# Patient Record
Sex: Female | Born: 1940
Health system: Southern US, Community
[De-identification: ages and names within clinical notes are randomized; demographics above are authoritative.]

## PROBLEM LIST (undated history)

## (undated) DIAGNOSIS — J45909 Unspecified asthma, uncomplicated: Secondary | ICD-10-CM

## (undated) DIAGNOSIS — I429 Cardiomyopathy, unspecified: Secondary | ICD-10-CM

## (undated) DIAGNOSIS — J449 Chronic obstructive pulmonary disease, unspecified: Secondary | ICD-10-CM

## (undated) DIAGNOSIS — I519 Heart disease, unspecified: Secondary | ICD-10-CM

## (undated) DIAGNOSIS — I10 Essential (primary) hypertension: Secondary | ICD-10-CM

## (undated) DIAGNOSIS — M1990 Unspecified osteoarthritis, unspecified site: Secondary | ICD-10-CM

## (undated) DIAGNOSIS — I5189 Other ill-defined heart diseases: Secondary | ICD-10-CM

## (undated) DIAGNOSIS — E7800 Pure hypercholesterolemia, unspecified: Secondary | ICD-10-CM

## (undated) DIAGNOSIS — H409 Unspecified glaucoma: Secondary | ICD-10-CM

## (undated) HISTORY — PX: HIP SURGERY: SHX245

## (undated) HISTORY — PX: REPLACEMENT TOTAL KNEE BILATERAL: SUR1225

## (undated) HISTORY — PX: ABDOMINAL HYSTERECTOMY: SHX81

## (undated) HISTORY — PX: JOINT REPLACEMENT: SHX530

## (undated) HISTORY — PX: OTHER SURGICAL HISTORY: SHX169

---

## 1997-12-29 ENCOUNTER — Ambulatory Visit (HOSPITAL_COMMUNITY): Admission: RE | Admit: 1997-12-29 | Discharge: 1997-12-29 | Payer: Self-pay | Admitting: Family Medicine

## 1999-01-30 ENCOUNTER — Encounter: Payer: Self-pay | Admitting: Family Medicine

## 1999-01-30 ENCOUNTER — Ambulatory Visit (HOSPITAL_COMMUNITY): Admission: RE | Admit: 1999-01-30 | Discharge: 1999-01-30 | Payer: Self-pay | Admitting: Family Medicine

## 1999-02-07 ENCOUNTER — Ambulatory Visit (HOSPITAL_COMMUNITY): Admission: RE | Admit: 1999-02-07 | Discharge: 1999-02-07 | Payer: Self-pay | Admitting: Family Medicine

## 1999-03-03 ENCOUNTER — Encounter: Payer: Self-pay | Admitting: Family Medicine

## 1999-03-03 ENCOUNTER — Ambulatory Visit (HOSPITAL_COMMUNITY): Admission: RE | Admit: 1999-03-03 | Discharge: 1999-03-03 | Payer: Self-pay | Admitting: Family Medicine

## 1999-03-21 ENCOUNTER — Ambulatory Visit (HOSPITAL_BASED_OUTPATIENT_CLINIC_OR_DEPARTMENT_OTHER): Admission: RE | Admit: 1999-03-21 | Discharge: 1999-03-21 | Payer: Self-pay

## 1999-05-15 ENCOUNTER — Encounter: Payer: Self-pay | Admitting: Orthopedic Surgery

## 1999-05-22 ENCOUNTER — Inpatient Hospital Stay (HOSPITAL_COMMUNITY): Admission: RE | Admit: 1999-05-22 | Discharge: 1999-05-27 | Payer: Self-pay | Admitting: Orthopedic Surgery

## 1999-05-22 ENCOUNTER — Encounter: Payer: Self-pay | Admitting: Orthopedic Surgery

## 2000-04-12 ENCOUNTER — Other Ambulatory Visit: Admission: RE | Admit: 2000-04-12 | Discharge: 2000-04-12 | Payer: Self-pay | Admitting: Family Medicine

## 2000-06-28 ENCOUNTER — Encounter: Admission: RE | Admit: 2000-06-28 | Discharge: 2000-06-28 | Payer: Self-pay | Admitting: Orthopedic Surgery

## 2000-06-28 ENCOUNTER — Encounter: Payer: Self-pay | Admitting: Orthopedic Surgery

## 2002-12-23 ENCOUNTER — Encounter: Payer: Self-pay | Admitting: Internal Medicine

## 2002-12-23 ENCOUNTER — Ambulatory Visit (HOSPITAL_COMMUNITY): Admission: RE | Admit: 2002-12-23 | Discharge: 2002-12-23 | Payer: Self-pay | Admitting: Family Medicine

## 2004-02-06 ENCOUNTER — Emergency Department (HOSPITAL_COMMUNITY): Admission: AD | Admit: 2004-02-06 | Discharge: 2004-02-06 | Payer: Self-pay | Admitting: Family Medicine

## 2004-05-19 ENCOUNTER — Other Ambulatory Visit: Admission: RE | Admit: 2004-05-19 | Discharge: 2004-05-19 | Payer: Self-pay | Admitting: Family Medicine

## 2004-05-24 ENCOUNTER — Encounter: Admission: RE | Admit: 2004-05-24 | Discharge: 2004-05-24 | Payer: Self-pay | Admitting: Family Medicine

## 2004-08-14 ENCOUNTER — Ambulatory Visit (HOSPITAL_COMMUNITY): Admission: RE | Admit: 2004-08-14 | Discharge: 2004-08-14 | Payer: Self-pay | Admitting: Gastroenterology

## 2005-11-19 ENCOUNTER — Other Ambulatory Visit: Admission: RE | Admit: 2005-11-19 | Discharge: 2005-11-19 | Payer: Self-pay | Admitting: Family Medicine

## 2005-11-19 ENCOUNTER — Encounter: Admission: RE | Admit: 2005-11-19 | Discharge: 2005-11-19 | Payer: Self-pay | Admitting: Family Medicine

## 2005-12-04 ENCOUNTER — Ambulatory Visit (HOSPITAL_BASED_OUTPATIENT_CLINIC_OR_DEPARTMENT_OTHER): Admission: RE | Admit: 2005-12-04 | Discharge: 2005-12-04 | Payer: Self-pay | Admitting: Orthopedic Surgery

## 2006-03-06 ENCOUNTER — Encounter: Payer: Self-pay | Admitting: Family Medicine

## 2006-11-20 ENCOUNTER — Encounter: Admission: RE | Admit: 2006-11-20 | Discharge: 2006-11-20 | Payer: Self-pay | Admitting: Family Medicine

## 2006-11-27 ENCOUNTER — Encounter: Admission: RE | Admit: 2006-11-27 | Discharge: 2006-11-27 | Payer: Self-pay | Admitting: Family Medicine

## 2008-03-01 ENCOUNTER — Ambulatory Visit: Payer: Self-pay | Admitting: Internal Medicine

## 2008-03-01 DIAGNOSIS — E785 Hyperlipidemia, unspecified: Secondary | ICD-10-CM | POA: Insufficient documentation

## 2008-03-01 DIAGNOSIS — I10 Essential (primary) hypertension: Secondary | ICD-10-CM

## 2008-03-01 DIAGNOSIS — R0602 Shortness of breath: Secondary | ICD-10-CM | POA: Insufficient documentation

## 2008-03-01 DIAGNOSIS — J309 Allergic rhinitis, unspecified: Secondary | ICD-10-CM | POA: Insufficient documentation

## 2008-03-04 ENCOUNTER — Ambulatory Visit (HOSPITAL_COMMUNITY): Admission: RE | Admit: 2008-03-04 | Discharge: 2008-03-04 | Payer: Self-pay | Admitting: Internal Medicine

## 2008-03-04 ENCOUNTER — Encounter: Payer: Self-pay | Admitting: Internal Medicine

## 2008-03-09 ENCOUNTER — Telehealth (INDEPENDENT_AMBULATORY_CARE_PROVIDER_SITE_OTHER): Payer: Self-pay | Admitting: *Deleted

## 2008-03-12 ENCOUNTER — Telehealth: Payer: Self-pay | Admitting: Internal Medicine

## 2008-03-22 ENCOUNTER — Telehealth: Payer: Self-pay | Admitting: Internal Medicine

## 2008-03-25 ENCOUNTER — Ambulatory Visit: Payer: Self-pay | Admitting: Internal Medicine

## 2008-03-25 ENCOUNTER — Telehealth: Payer: Self-pay | Admitting: Internal Medicine

## 2008-03-25 LAB — CONVERTED CEMR LAB
BUN: 11 mg/dL (ref 6–23)
CO2: 29 meq/L (ref 19–32)
Calcium: 10.6 mg/dL — ABNORMAL HIGH (ref 8.4–10.5)
GFR calc Af Amer: 128 mL/min
Glucose, Bld: 162 mg/dL — ABNORMAL HIGH (ref 70–99)

## 2008-04-01 ENCOUNTER — Ambulatory Visit: Payer: Self-pay

## 2008-04-01 ENCOUNTER — Encounter: Payer: Self-pay | Admitting: Internal Medicine

## 2008-04-05 ENCOUNTER — Telehealth (INDEPENDENT_AMBULATORY_CARE_PROVIDER_SITE_OTHER): Payer: Self-pay | Admitting: *Deleted

## 2008-04-28 ENCOUNTER — Ambulatory Visit: Payer: Self-pay | Admitting: Internal Medicine

## 2008-06-21 ENCOUNTER — Inpatient Hospital Stay (HOSPITAL_COMMUNITY): Admission: RE | Admit: 2008-06-21 | Discharge: 2008-06-25 | Payer: Self-pay | Admitting: Orthopedic Surgery

## 2008-11-23 ENCOUNTER — Other Ambulatory Visit: Admission: RE | Admit: 2008-11-23 | Discharge: 2008-11-23 | Payer: Self-pay | Admitting: Family Medicine

## 2008-12-02 ENCOUNTER — Ambulatory Visit (HOSPITAL_COMMUNITY): Admission: RE | Admit: 2008-12-02 | Discharge: 2008-12-02 | Payer: Self-pay | Admitting: Surgery

## 2009-01-21 ENCOUNTER — Ambulatory Visit (HOSPITAL_COMMUNITY): Admission: RE | Admit: 2009-01-21 | Discharge: 2009-01-21 | Payer: Self-pay | Admitting: Surgery

## 2009-01-21 ENCOUNTER — Encounter (INDEPENDENT_AMBULATORY_CARE_PROVIDER_SITE_OTHER): Payer: Self-pay | Admitting: Surgery

## 2009-05-28 ENCOUNTER — Emergency Department (HOSPITAL_COMMUNITY): Admission: EM | Admit: 2009-05-28 | Discharge: 2009-05-28 | Payer: Self-pay | Admitting: Family Medicine

## 2009-10-19 ENCOUNTER — Telehealth (INDEPENDENT_AMBULATORY_CARE_PROVIDER_SITE_OTHER): Payer: Self-pay | Admitting: *Deleted

## 2009-10-25 ENCOUNTER — Encounter: Admission: RE | Admit: 2009-10-25 | Discharge: 2009-10-25 | Payer: Self-pay | Admitting: Family Medicine

## 2009-11-10 ENCOUNTER — Ambulatory Visit: Payer: Self-pay | Admitting: Internal Medicine

## 2009-11-10 DIAGNOSIS — J9610 Chronic respiratory failure, unspecified whether with hypoxia or hypercapnia: Secondary | ICD-10-CM | POA: Insufficient documentation

## 2009-11-10 DIAGNOSIS — R635 Abnormal weight gain: Secondary | ICD-10-CM | POA: Insufficient documentation

## 2009-11-10 DIAGNOSIS — J45909 Unspecified asthma, uncomplicated: Secondary | ICD-10-CM | POA: Insufficient documentation

## 2009-11-21 ENCOUNTER — Telehealth (INDEPENDENT_AMBULATORY_CARE_PROVIDER_SITE_OTHER): Payer: Self-pay | Admitting: *Deleted

## 2009-12-12 ENCOUNTER — Ambulatory Visit: Payer: Self-pay | Admitting: Internal Medicine

## 2009-12-13 LAB — CONVERTED CEMR LAB
BUN: 12 mg/dL (ref 6–23)
Basophils Absolute: 0 10*3/uL (ref 0.0–0.1)
CO2: 28 meq/L (ref 19–32)
Calcium: 9.4 mg/dL (ref 8.4–10.5)
Eosinophils Absolute: 0 10*3/uL (ref 0.0–0.7)
GFR calc non Af Amer: 126.86 mL/min (ref >60)
Glucose, Bld: 151 mg/dL — ABNORMAL HIGH (ref 70–99)
HCT: 37.7 % (ref 36.0–46.0)
Hemoglobin: 11.8 g/dL — ABNORMAL LOW (ref 12.0–15.0)
Lymphs Abs: 1 10*3/uL (ref 0.7–4.0)
MCHC: 31.3 g/dL (ref 30.0–36.0)
Monocytes Absolute: 0.2 10*3/uL (ref 0.1–1.0)
Monocytes Relative: 3.8 % (ref 3.0–12.0)
Neutro Abs: 5 10*3/uL (ref 1.4–7.7)
Platelets: 335 10*3/uL (ref 150.0–400.0)
Potassium: 4.1 meq/L (ref 3.5–5.1)
RDW: 15.7 % — ABNORMAL HIGH (ref 11.5–14.6)
Sodium: 143 meq/L (ref 135–145)
TSH: 1.46 microintl units/mL (ref 0.35–5.50)

## 2009-12-19 ENCOUNTER — Encounter: Payer: Self-pay | Admitting: Internal Medicine

## 2009-12-21 ENCOUNTER — Telehealth (INDEPENDENT_AMBULATORY_CARE_PROVIDER_SITE_OTHER): Payer: Self-pay | Admitting: *Deleted

## 2010-01-04 ENCOUNTER — Ambulatory Visit: Payer: Self-pay | Admitting: Internal Medicine

## 2010-01-27 ENCOUNTER — Telehealth: Payer: Self-pay | Admitting: Internal Medicine

## 2010-02-03 ENCOUNTER — Ambulatory Visit: Payer: Self-pay | Admitting: Internal Medicine

## 2010-02-08 ENCOUNTER — Telehealth (INDEPENDENT_AMBULATORY_CARE_PROVIDER_SITE_OTHER): Payer: Self-pay | Admitting: *Deleted

## 2010-02-09 ENCOUNTER — Encounter: Payer: Self-pay | Admitting: Internal Medicine

## 2010-05-11 ENCOUNTER — Ambulatory Visit: Payer: Self-pay | Admitting: Internal Medicine

## 2010-05-18 ENCOUNTER — Encounter: Payer: Self-pay | Admitting: Internal Medicine

## 2010-06-22 ENCOUNTER — Ambulatory Visit: Payer: Self-pay | Admitting: Internal Medicine

## 2010-08-15 ENCOUNTER — Telehealth (INDEPENDENT_AMBULATORY_CARE_PROVIDER_SITE_OTHER): Payer: Self-pay | Admitting: *Deleted

## 2010-09-07 ENCOUNTER — Telehealth: Payer: Self-pay | Admitting: Internal Medicine

## 2010-09-27 ENCOUNTER — Inpatient Hospital Stay (HOSPITAL_COMMUNITY)
Admission: EM | Admit: 2010-09-27 | Discharge: 2010-10-03 | Payer: Self-pay | Source: Home / Self Care | Admitting: Emergency Medicine

## 2010-09-27 ENCOUNTER — Ambulatory Visit: Payer: Self-pay | Admitting: Cardiology

## 2010-09-28 ENCOUNTER — Encounter: Payer: Self-pay | Admitting: Cardiology

## 2010-10-02 ENCOUNTER — Encounter: Payer: Self-pay | Admitting: Cardiology

## 2010-10-03 ENCOUNTER — Encounter: Payer: Self-pay | Admitting: Internal Medicine

## 2010-10-04 ENCOUNTER — Telehealth: Payer: Self-pay | Admitting: Internal Medicine

## 2010-11-02 ENCOUNTER — Encounter: Payer: Self-pay | Admitting: Internal Medicine

## 2010-11-08 ENCOUNTER — Encounter: Payer: Self-pay | Admitting: Internal Medicine

## 2010-11-08 DIAGNOSIS — I517 Cardiomegaly: Secondary | ICD-10-CM | POA: Insufficient documentation

## 2010-11-09 ENCOUNTER — Ambulatory Visit
Admission: RE | Admit: 2010-11-09 | Discharge: 2010-11-09 | Payer: Self-pay | Source: Home / Self Care | Attending: Internal Medicine | Admitting: Internal Medicine

## 2010-11-09 ENCOUNTER — Encounter: Payer: Self-pay | Admitting: Internal Medicine

## 2010-11-09 ENCOUNTER — Ambulatory Visit: Payer: Self-pay | Admitting: Cardiology

## 2010-11-09 DIAGNOSIS — R079 Chest pain, unspecified: Secondary | ICD-10-CM | POA: Insufficient documentation

## 2010-11-09 DIAGNOSIS — I4892 Unspecified atrial flutter: Secondary | ICD-10-CM | POA: Insufficient documentation

## 2010-11-15 ENCOUNTER — Telehealth (INDEPENDENT_AMBULATORY_CARE_PROVIDER_SITE_OTHER): Payer: Self-pay | Admitting: Radiology

## 2010-11-16 ENCOUNTER — Encounter (HOSPITAL_COMMUNITY)
Admission: RE | Admit: 2010-11-16 | Discharge: 2010-12-05 | Payer: Self-pay | Source: Home / Self Care | Attending: Internal Medicine | Admitting: Internal Medicine

## 2010-11-16 ENCOUNTER — Ambulatory Visit: Admission: RE | Admit: 2010-11-16 | Discharge: 2010-11-16 | Payer: Self-pay | Source: Home / Self Care

## 2010-11-16 ENCOUNTER — Encounter: Payer: Self-pay | Admitting: Cardiovascular Disease

## 2010-12-05 NOTE — Progress Notes (Signed)
Summary: questions about pradaxa  Phone Note Call from Patient Call back at Home Phone [phone]   Caller: Patient Summary of Call: Question about Pradaxa Initial call taken by: Delsa Sale,  October 04, 2010 10:40 AM  Follow-up for Phone Call        pt states that pharmacy not able to get Pradaxa.  See from Susank note that she needs to be on Pradaxa for another 3 weeks.  Adv pt would put samples for 3 weeks up front for her.  She will send someone to pick it up.  Follow-up by: Joan Flores RN,  October 04, 2010 1:44 PM

## 2010-12-05 NOTE — Assessment & Plan Note (Signed)
Summary: Pulmonary/ await labs   Copy to:  self Primary Provider/Referring Provider:  Dr. Kelton Pillar @ [facility]  CC:  4 wk follow up with PFTs.  states breathing is has been getting worse since October-having increased SOB with activity and at rest. .  History of Present Illness: 70 yobf never smoker seen by [facility] in 2009 with chronic doe with desat after 500 ft walking  for clearance for right hip surgery which went fine.  November 10, 2009 cc  much worse breathing since  Oct 2010 with lots of drainage of sinuses which improved but breathing never did.  Sob across the room.  was coughing not now. mostly with walking but also occ lying down.  no real variability.   rec try off advair and spiriva and teach dulera and PPI but ran out 2/5   December 12, 2009 4 wk follow up with PFTs.  states breathing is has been getting worse since October-having increased SOB with activity and at rest.  not using inhalers correctly. no sign cough. Pt denies any significant sore throat, dysphagia, itching, sneezing,  nasal congestion or excess secretions,  fever, chills, sweats, unintended wt loss, pleuritic or exertional cp, hempoptysis,  orthopnea pnd or leg swelling. Pt also denies any obvious fluctuation in symptoms with weather or environmental change or other alleviating or aggravating factors.       Current Medications (verified): 1)  Actoplus Met 15-850 Mg  Tabs (Pioglitazone Hcl-Metformin Hcl) .... Three Times A Day 2)  Lexapro 10 Mg  Tabs (Escitalopram Oxalate) .... Once Daily 3)  Diovan Hct 160-25 Mg Tabs (Valsartan-Hydrochlorothiazide) .Marland Kitchen.. 1 Once Daily 4)  Simvastatin 80 Mg  Tabs (Simvastatin) .... Once Daily 5)  Multi For Her 50+   Tabs (Multiple Vitamins-Minerals) .... Once Daily 6)  Mucinex D [phone] Mg  Tb12 (Pseudoephedrine-Guaifenesin) .... As Needed 7)  Vitamin D3 1000 Unit Caps (Cholecalciferol) .Marland Kitchen.. 1 Once Daily 8)  Azopt 1 % Susp (Brinzolamide) .Marland Kitchen.. 1 Drop Each Eye Two Times A  Day 9)  Travatan Z 0.004 % Soln (Travoprost) .Marland Kitchen.. 1 Drop in Each Eye At Bedtime 10)  Dulera 100-5 Mcg/act Aero (Mometasone Furo-Formoterol Fum) .... 2 Puffs First Thing  in Am and 2 Puffs Again in Pm About 12 Hours Later 11)  Dexilant 60 Mg Cpdr (Dexlansoprazole) .... Take  One 30-60 Min Before First Meal of The Day  Allergies (verified): No Known Drug Allergies  Past History:  Past Medical History: Allergic Rhinitis Diabetes Hyperlipidemia Hypertension Obesity - BMI 34, April 2009.  No h/o anorectic drug use ? Pulmponary Hypertension suggested CT chest Jan 2008 >  Normal ECHO 03/2008  Normal resting abg - 03/04/2008 6 minute walk test 03/04/2008 - 500 feet with desat to 82% at 2 minutes - refused O2 Chronic asthma       -  PFTs 03/04/2008 - Fev1 1.26L/59% Ratio 67  FEV1  13% better after B2  DLCO 12.6/49%       -  PFT's December 12, 2009 1.13(54%) ratio 62     with 10% p  B2                DLC0  1.2/51%        - HFA 25%,  DPI 75% December 12, 2009   Vital Signs:  Patient profile:   70 year old female Height:      65 inches Weight:      215 pounds BMI:  35.91 O2 Sat:      84 % on 109Room air Temp:     98.1 degrees F oral BP sitting:   152 / 70  (left arm) Cuff size:   regular  Vitals Entered By: Raymondo Band RN (December 12, 2009 10:47 AM)  O2 Flow:  109Room air  O2 Sat Comments Pt arrived to exam room with o2 sat of 84% RA.  After resting for a few minutes, o2 sat increased to 92% RA with pulse of 105.  Crystal Jones RN  December 12, 2009 10:49 AM  CC: 4 wk follow up with PFTs.  states breathing is has been getting worse since October-having increased SOB with activity and at rest.  Comments Medications reviewed with patient Daytime contact number verified with patient. Raymondo Band RN  December 12, 2009 10:47 AM    Physical Exam  Additional Exam:  wt 220 November 10, 2009 > 215 December 12, 2009  obese amb bf nad HEENT: nl dentition, turbinates, and orophanx. Nl  external ear canals without cough reflex NECK :  without JVD/Nodes/TM/ nl carotid upstrokes bilaterally LUNGS: no acc muscle use, trace crackles bilaterally  without cough on insp or exp maneuvers CV:  RRR  no s3 or murmur or increase in P2, no edema   ABD:  soft and nontender with nl excursion in the supine position. No bruits or organomegaly, bowel sounds nl MS:  warm without deformities, calf tenderness, cyanosis or clubbing     Sodium                    143 mEq/L                   135-145   Potassium                 4.1 mEq/L                   3.5-5.1   Chloride                  106 mEq/L                   96-112   Carbon Dioxide            28 mEq/L                    19-32   Glucose              [H]  151 mg/dL                   70-99   BUN                       12 mg/dL                    6-23   Creatinine                0.6 mg/dL                   0.4-1.2   Calcium                   9.4 mg/dL                   8.4-10.5   GFR  126.86 mL/min               >60  Tests: (2) CBC Platelet w/Diff (CBCD)   White Cell Count          6.2 K/uL                    4.5-10.5   Red Cell Count            4.05 Mil/uL                 3.87-5.11   Hemoglobin           [L]  11.8 g/dL                   12.0-15.0   Hematocrit                37.7 %                      36.0-46.0   MCV                       93.0 fl                     78.0-100.0   MCHC                      31.3 g/dL                   30.0-36.0   RDW                  [H]  15.7 %                      11.5-14.6   Platelet Count            335.0 K/uL                  150.0-400.0   Neutrophil %         [H]  79.9 %                      43.0-77.0   Lymphocyte %              15.5 %                      12.0-46.0   Monocyte %                3.8 %                       3.0-12.0   Eosinophils%              0.8 %                       0.0-5.0   Basophils %               0.0 %                       0.0-3.0   Neutrophill Absolute       5.0 K/uL                    1.4-7.7   Lymphocyte Absolute       1.0 K/uL  0.7-4.0   Monocyte Absolute         0.2 K/uL                    0.1-1.0  Eosinophils, Absolute                             0.0 K/uL                    0.0-0.7   Basophils Absolute        0.0 K/uL                    0.0-0.1  Tests: (3) TSH (TSH)   FastTSH                   1.46 uIU/mL                 0.35-5.50  Tests: (4) B-Type Natiuretic Peptide (BNPR)  B-Type Natriuetic Peptide                        [H]  372.0 pg/mL                 0.0-100.0  Clinical Reports Reviewed:  CXR:  10/25/2009: CXR Results:  no def infiltrates   Impression & Recommendations:  Problem # 1:  ASTHMA, UNSPECIFIED (ICD-493.90) Suspect she has chronic asthma and obesity deconditioning combined    DDX of  difficult airways managment all start with A and  include Adherence, Ace Inhibitors, Acid Reflux, Active Sinus Disease, Alpha 1 Antitripsin deficiency, Anxiety masquerading as Airways dz,  ABPA,  allergy(esp in young), Aspiration (esp in elderly), Adverse effects of DPI,  Active smokers, plus one B  = Beta blocker use.Marland Kitchen    Spent extra time coaching optimal hfa/dpi with much more success with dpi, so rechallenge with advair and as needed xopenex and if not better with hfa and not happy with advair option : change to Brovana and Budesonide, the equivalent of Symbicort, per neb next ov  Problem # 2:  ALLERGIC RHINITIS (ICD-477.9)  Try add pepcid at hs and as needed chlortrimeton.   Each maintenance medication was reviewed in detail including most importantly the difference between maintenance and as needed and under what circumstances the prns are to be used.   Orders: Est. Patient Level IV [id number])  Medications Added to Medication List This Visit: 1)  Advair Diskus 250-50 Mcg/dose Misc (Fluticasone-salmeterol) .... One puff twice daily 2)  Xopenex Hfa 45 Mcg/act Aero (Levalbuterol tartrate) .... 2 puffs every  4 hours as needed 3)  Pepcid Ac Maximum Strength 20 Mg Tabs (Famotidine) .... One in am and one at bedtime  Other Orders: HFA Instruction [phone]) TLB-BMP (Basic Metabolic Panel-BMET) (99991111) TLB-CBC Platelet - w/Differential (85025-CBCD) TLB-TSH (Thyroid Stimulating Hormone) (84443-TSH) TLB-BNP (B-Natriuretic Peptide) (83880-BNPR)  Patient Instructions: 1)  Advair 250/50 one in am and in one in pm 2)  Pepcid 20 mg one in am and one bedtime 3)  if needed for sob > xopenex 2 puffs up to every 4hour 4)  if needed for drainage at night take chlortrimeton 4mg  one every 6 5)  GERD (REFLUX)  is a common cause of respiratory symptoms. It commonly presents without heartburn and can be treated with medication, but also with lifestyle changes including avoidance of late meals, excessive alcohol, smoking cessation, and avoid fatty foods, chocolate, peppermint, colas, red  wine, and acidic juices such as orange juice. NO MINT OR MENTHOL PRODUCTS SO NO COUGH DROPS  6)  USE SUGARLESS CANDY INSTEAD (jolley ranchers)  7)  NO OIL BASED VITAMINS  8)  Please schedule a follow-up appointment in 2 weeks. 9)  Work on inhaler technique:  relax and blow all the way out then take a nice smooth deep breath back in, triggering the inhaler at same time you start breathing in

## 2010-12-05 NOTE — Assessment & Plan Note (Signed)
Summary: Pulmonary/ ext of with hfa teaching, trial of symbicort 160   Copy to:  self Primary Provider/Referring Provider:  Dr. Kelton Pillar @ [facility]  CC:  2 wk folliowup.  Pt states that her breathing is about 50% improved since last visit.  She states that advair seemed to help some but she ran out of this 1 wk ago.Marland Kitchen  History of Present Illness: 56 yobf never smoker seen by [facility] in 2009 with chronic doe with desat after 500 ft walking  for clearance for right hip surgery which went fine.  November 10, 2009 cc  much worse breathing since  Oct 2010 with lots of drainage of sinuses which improved but breathing never did.  Sob across the room.  was coughing not now. mostly with walking but also occ lying down.  no real variability.   rec try off advair and spiriva and teach dulera and PPI but ran out 2/5   December 12, 2009 4 wk follow up with PFTs.  states breathing is has been getting worse since October-having increased SOB with activity and at rest.  not using inhalers correctly. no sign cough.  January 04, 2010 2 wk folliowup.  Pt states that her breathing is about 50% improved since last visit.  She states that advair seemed to help some but she ran out of this 1 wk ago. While on advair continued to feel need for saba at least twice daily.  Pt denies any significant sore throat, dysphagia, itching, sneezing,  nasal congestion or excess secretions,  fever, chills, sweats, unintended wt loss, pleuritic or exertional cp, hempoptysis, change in activity tolerance  orthopnea pnd or leg swelling Pt also denies any obvious fluctuation in symptoms with weather or environmental change or other alleviating or aggravating factors.         Current Medications (verified): 1)  Actoplus Met 15-850 Mg  Tabs (Pioglitazone Hcl-Metformin Hcl) .... Three Times A Day 2)  Lexapro 10 Mg  Tabs (Escitalopram Oxalate) .... Once Daily 3)  Diovan Hct 160-25 Mg Tabs (Valsartan-Hydrochlorothiazide) .Marland Kitchen.. 1 Once  Daily 4)  Simvastatin 80 Mg  Tabs (Simvastatin) .... Once Daily 5)  Multi For Her 50+   Tabs (Multiple Vitamins-Minerals) .... Once Daily 6)  Mucinex D [phone] Mg  Tb12 (Pseudoephedrine-Guaifenesin) .... As Needed 7)  Vitamin D3 1000 Unit Caps (Cholecalciferol) .Marland Kitchen.. 1 Once Daily 8)  Azopt 1 % Susp (Brinzolamide) .Marland Kitchen.. 1 Drop Each Eye Two Times A Day 9)  Travatan Z 0.004 % Soln (Travoprost) .Marland Kitchen.. 1 Drop in Each Eye At Bedtime 10)  Xopenex Hfa 45 Mcg/act Aero (Levalbuterol Tartrate) .... 2 Puffs Every 4 Hours As Needed 11)  Pepcid Ac Maximum Strength 20 Mg Tabs (Famotidine) .... One in Am and One At Bedtime  Allergies (verified): No Known Drug Allergies  Past History:  Past Medical History: Allergic Rhinitis Diabetes Hyperlipidemia Hypertension Obesity - BMI 34, April 2009.  No h/o anorectic drug use ? Pulmponary Hypertension suggested CT chest Jan 2008 >  Normal ECHO 03/2008  Normal resting abg - 03/04/2008 6 minute walk test 03/04/2008 - 500 feet with desat to 82% at 2 minutes - refused O2 Chronic asthma       -  PFTs 03/04/2008 - Fev1 1.26L/59% Ratio 67  FEV1  13% better after B2  DLCO 12.6/49%       -  PFT's December 12, 2009 1.13(54%) ratio 62     with 10% p  B2  DLC0 12 /51%        - HFA 25%,  DPI 75% December 12, 2009 > 75% on rechallenge January 04, 2010   Vital Signs:  Patient profile:   70 year old female Weight:      214 pounds O2 Sat:      90 % on Room air Temp:     97.6 degrees F oral Pulse rate:   95 / minute BP sitting:   156 / 90  (left arm) Cuff size:   large  Vitals Entered By: Tilden Dome (January 04, 2010 9:04 AM)  O2 Flow:  Room air  Physical Exam  Additional Exam:  wt 220 November 10, 2009 > 215 December 12, 2009 > 214 January 04, 2010  obese amb bf nad HEENT: nl dentition, turbinates, and orophanx. Nl external ear canals without cough reflex NECK :  without JVD/Nodes/TM/ nl carotid upstrokes bilaterally LUNGS: no acc muscle use, clear  bilaterally  without cough on insp or exp maneuvers CV:  RRR  no s3 or murmur or increase in P2, no edema   ABD:  soft and nontender with nl excursion in the supine position. No bruits or organomegaly, bowel sounds nl MS:  warm without deformities, calf tenderness, cyanosis or clubbing       Clinical Reports Reviewed:  CXR:  10/25/2009: CXR Results:  no def infiltrates   Impression & Recommendations:  Problem # 1:  ASTHMA, UNSPECIFIED (ICD-493.90)    chronic asthma and obesity deconditioning combined    DDX of  difficult airways managment all start with A and  include Adherence, Ace Inhibitors, Acid Reflux, Active Sinus Disease, Alpha 1 Antitripsin deficiency, Anxiety masquerading as Airways dz,  ABPA,  allergy(esp in young), Aspiration (esp in elderly), Adverse effects of DPI,  Active smokers, plus one B  = Beta blocker use.Marland Kitchen    Spent extra time coaching optimal hfa/dpi with much more success with hfa and did not see need for saba drop on advair so challenge with one month's supply of symbicort 160 2 puffs first thing  in am and 2 puffs again in pm about 12 hours later   I spent extra time with the patient today explaining optimal mdi  technique.  This improved from  50-75%   Each maintenance medication was reviewed in detail including most importantly the difference between maintenance and as needed and under what circumstances the prns are to be used. See instructions for specific recommendations   Medications Added to Medication List This Visit: 1)  Symbicort 160-4.5 Mcg/act Aero (Budesonide-formoterol fumarate) .... Two puffs twice daily  Complete Medication List: 1)  Actoplus Met 15-850 Mg Tabs (Pioglitazone hcl-metformin hcl) .... Three times a day 2)  Lexapro 10 Mg Tabs (Escitalopram oxalate) .... Once daily 3)  Diovan Hct 160-25 Mg Tabs (Valsartan-hydrochlorothiazide) .Marland Kitchen.. 1 once daily 4)  Simvastatin 80 Mg Tabs (Simvastatin) .... Once daily 5)  Multi For Her 50+ Tabs  (Multiple vitamins-minerals) .... Once daily 6)  Mucinex D [phone] Mg Tb12 (Pseudoephedrine-guaifenesin) .... As needed 7)  Vitamin D3 1000 Unit Caps (Cholecalciferol) .Marland Kitchen.. 1 once daily 8)  Azopt 1 % Susp (Brinzolamide) .Marland Kitchen.. 1 drop each eye two times a day 9)  Travatan Z 0.004 % Soln (Travoprost) .Marland Kitchen.. 1 drop in each eye at bedtime 10)  Xopenex Hfa 45 Mcg/act Aero (Levalbuterol tartrate) .... 2 puffs every 4 hours as needed 11)  Pepcid Ac Maximum Strength 20 Mg Tabs (Famotidine) .... One in  am and one at bedtime 12)  Symbicort 160-4.5 Mcg/act Aero (Budesonide-formoterol fumarate) .... Two puffs twice daily  Other Orders: Est. Patient Level IV [id number])  Patient Instructions: 1)  Stop advair 2)  Symbicort 160 2 puffs first thing  in am and 2 puffs again in pm about 12 hours later and rinse and gargle 3)  Pepcid 20 mg one in am and one bedtime 4)  if needed for sob > xopenex 2 puffs up to every 4hour - if you need a prescription let us know because we have samples 5)  if needed for drainage at night take chlortrimeton 4mg  one every 6 6)  GERD (REFLUX)  is a common cause of respiratory symptoms. It commonly presents without heartburn and can be treated with medication, but also with lifestyle changes including avoidance of late meals, excessive alcohol, smoking cessation, and avoid fatty foods, chocolate, peppermint, colas, red wine, and acidic juices such as orange juice. NO MINT OR MENTHOL PRODUCTS SO NO COUGH DROPS  7)  USE SUGARLESS CANDY INSTEAD (jolley ranchers)  8)  NO OIL BASED VITAMINS  9)  Please schedule a follow-up appointment in 4 weeks.

## 2010-12-05 NOTE — Progress Notes (Signed)
Summary: reaction  Phone Note Call from Patient   Caller: Patient Call For: wert Summary of Call: pt want to talk to nurse about dulera and dexilant think she may be having reaction Initial call taken by: Gustavus Bryant,  November 21, 2009 10:57 AM  Follow-up for Phone Call        Spoke with pt.  She states that her breathing has not improved since she started on dulera.  She states that she has only been taking 1 puff two times a day and feels that her breathing may be getting worse.  Advised that she take 2 puffs two times a day as directed by MW.  Offered ov with TP, but pt refused and stated that she will take the 2 puffs two times a day and see if this helps abd if not call back for appt. Follow-up by: Tilden Dome,  November 21, 2009 11:07 AM

## 2010-12-05 NOTE — Procedures (Signed)
Summary: ok sats on 2lpm sleeping  Oximetry/[facility]   Imported By: Phillis Knack 06/06/2010 15:14:49  _____________________________________________________________________  External Attachment:    Type:   Image     Comment:   External Document

## 2010-12-05 NOTE — Progress Notes (Signed)
Summary: Pt requesting o2 be d/c'ed  > done 09/08/10  Phone Note Call from Patient Call back at [facility] Phone [phone]   Caller: Patient Call For: wert Summary of Call: Needs an order for [facility] to remove oxygen from her home, ok to [facility]. Initial call taken by: Netta Neat,  September 07, 2010 1:17 PM  Follow-up for Phone Call        Pt needs to stay on o2 with exertion more than walking from room and at HS to room per last ov. LMOMTCB Tilden Dome  September 07, 2010 1:26 PM   Additional Follow-up for Phone Call Additional follow up Details #1::        Spoke with pt.  She states that she would like order to pick up o2.  She states that Dr Caprice Red told her this was okay.  I asked if she had any sort of testing to prove the she no longer needs o2 and pt stated that indeed she did, but when I asked what type of test she stated, "I don't have to tell you about that, just tell them to get this oxygen out of my house"! Pls advise thanks. Additional Follow-up by: Tilden Dome,  September 07, 2010 5:08 PM    Additional Follow-up for Phone Call Additional follow up Details #2::    Informed  by pt she  is switching to Dr Bernita Buffy for her pulmonary care and does not plan to f/u here.    Will need referral from Dr Laurann Montana to re-establish with this group.  I do not condone stopping 02 without specific studies to prove the lack of need and she would not disclose what has been done for this purpose.    Therefore our order will be to d/c 02 today with no furhter f/u here planned. Follow-up by: Tanda Rockers MD,  September 08, 2010 9:06 AM

## 2010-12-05 NOTE — Progress Notes (Signed)
Summary: waiting on rx for Symbicort  Phone Note Call from Patient Call back at Home Phone [phone]   Caller: Patient Call For: wert Summary of Call: pt is out of symbicort. [facility] on [address] Initial call taken by: Cooper Render, CNA,  August 15, 2010 5:06 PM  Follow-up for Phone Call        per EMR, rx was already sent via [facility] on 07/22/2010 for 11 total fills.  called and spoke with Donna Christen at [facility] [address] and was informed they never got rx we sent them.  therefore gave verbal order for refills.  called and spoke with pt and informed her of the above info and that rx should now be ready for her to pick up at pharmacy.  Matthew Folks LPN  October 11, [id number] 5:14 PM

## 2010-12-05 NOTE — Assessment & Plan Note (Signed)
Summary: Pulmlonary/ ext ov with hfa teaching and amb 02   Visit Type:  Initial Consult Copy to:  self Primary Provider/Referring Provider:  Dr. Kelton Pillar @ [facility]  CC:  Dyspnea.  History of Present Illness: 70 yobf never smoker seen by [facility] in 2009 with chronic doe with desat after 500 ft walking  for clearance for right hip surgery which went fine.  November 10, 2009 cc  much worse breathing since  Oct 2010 with lots of drainage of sinuses which improved but breathing never did.  Sob across the room.  was coughing not now. mostly with walking but also occ lying down.  no real variability.  Pt denies any significant sore throat, dysphagia, itching, sneezing,  nasal congestion or excess secretions,  fever, chills, sweats, unintended wt loss, pleuritic or exertional cp, hempoptysis, change in activity tolerance  orthopnea pnd or leg swelling.  Pt also denies any obvious fluctuation in symptoms with weather or environmental change or other alleviating or aggravating factors.     Pt denies any increase in rescue therapy over baseline, denies waking up needing it or having early am exacerbations of coughing/wheezing/ or dyspnea.    Current Medications (verified): 1)  Actoplus Met 15-850 Mg  Tabs (Pioglitazone Hcl-Metformin Hcl) .... Three Times A Day 2)  Lexapro 10 Mg  Tabs (Escitalopram Oxalate) .... Once Daily 3)  Diovan Hct 160-25 Mg Tabs (Valsartan-Hydrochlorothiazide) .Marland Kitchen.. 1 Once Daily 4)  Simvastatin 80 Mg  Tabs (Simvastatin) .... Once Daily 5)  Multi For Her 50+   Tabs (Multiple Vitamins-Minerals) .... Once Daily 6)  Mucinex D [phone] Mg  Tb12 (Pseudoephedrine-Guaifenesin) .... As Needed 7)  Advair Diskus 100-50 Mcg/dose  Misc (Fluticasone-Salmeterol) .... Once Daily 8)  Spiriva Handihaler 18 Mcg  Caps (Tiotropium Bromide Monohydrate) .... Once Daily 9)  Vitamin D3 1000 Unit Caps (Cholecalciferol) .Marland Kitchen.. 1 Once Daily 10)  Azopt 1 % Susp (Brinzolamide) .Marland Kitchen.. 1 Drop Each Eye Two  Times A Day 11)  Travatan Z 0.004 % Soln (Travoprost) .Marland Kitchen.. 1 Drop in Each Eye At Bedtime  Allergies (verified): No Known Drug Allergies  Past History:  Past Medical History: Allergic Rhinitis Diabetes Hyperlipidemia Hypertension Obesity - BMI 34, April 2009.  No h/o anorectic drug use ? Pulmponary Hypertension noted on CT chest Jan 2008 >  Normal ECHO 03/2008  Normal resting abg - 03/04/2008 6 minute walk test 03/04/2008 - 500 feet with desat to 82% at 2 minutes - refused O2 Chronic asthma         PFTs 03/04/2008 - Fev1 1.26L/59%  FEV1  13% better after B2  Ratio 67DLCO 12.6/49%  Family History: Allergies Asthma - sister Dad and Mom lived up to 3 and died of "natural causes"  Social History: lives alone Widowed 2 children retired...inspected checks. Exposed to passive smoking as a child and adult (parents and brother smoked). Signfiicant dust expsoure during working life Never smoker No ETOH  Review of Systems       The patient complains of shortness of breath with activity, shortness of breath at rest, productive cough, loss of appetite, depression, hand/feet swelling, joint stiffness or pain, and change in color of mucus.  The patient denies non-productive cough, coughing up blood, chest pain, irregular heartbeats, acid heartburn, indigestion, weight change, abdominal pain, difficulty swallowing, sore throat, tooth/dental problems, headaches, nasal congestion/difficulty breathing through nose, sneezing, itching, ear ache, anxiety, rash, and fever.    Vital Signs:  Patient profile:   70 year old female Weight:  220 pounds BMI:     36.74 O2 Sat:      89 % on Room air Temp:     97.7 degrees F oral Pulse rate:   98 / minute BP sitting:   116 / 70  (left arm) Cuff size:   large  Vitals Entered By: Tilden Dome (November 10, 2009 1:39 PM)  O2 Flow:  Room air  Physical Exam  Additional Exam:  wt 205 > 220 November 10, 2009  obese amb bf nad HEENT: nl dentition,  turbinates, and orophanx. Nl external ear canals without cough reflex NECK :  without JVD/Nodes/TM/ nl carotid upstrokes bilaterally LUNGS: no acc muscle use, trace crackles bilaterally  without cough on insp or exp maneuvers CV:  RRR  no s3 or murmur or increase in P2, no edema   ABD:  soft and nontender with nl excursion in the supine position. No bruits or organomegaly, bowel sounds nl MS:  warm without deformities, calf tenderness, cyanosis or clubbing SKIN: warm and dry without lesions   NEURO:  alert, approp, no deficits     CXR  Procedure date:  10/25/2009  Findings:      no def infiltrates  Impression & Recommendations:  Problem # 1:  ASTHMA, UNSPECIFIED (ICD-493.90) PFT's c/w chronic asthma though symptoms are atypical and desaturation not explained by this degree of airflow obstruction   DDX of  difficult airways managment all start with A and  include Adherence, Ace Inhibitors, Acid Reflux, Active Sinus Disease, Alpha 1 Antitripsin deficiency, Anxiety masquerading as Airways dz,  ABPA,  allergy(esp in young), Aspiration (esp in elderly), Adverse effects of DPI,  Active smokers, plus one B  = Beta blocker use..    Adherence is a mulitlayered issue and starts with ? whether can use inhalers effectively .I spent extra time with the patient today explaining optimal mdi  technique.  This improved from  25> 50% but no better.    Try Rx of Acid reflux emprically for now with PPI and diet.  Problem # 2:  WEIGHT GAIN, ABNORMAL (ICD-783.1)  Weight control is a matter of calorie balance which needs to be tilted in the pt's favor by eating less and exercising more.  Specifically, I recommended  exercise at a level where pt  is short of breath but not out of breath 30 minutes daily.  If not losing weight on this program, I would strongly recommend pt see a nutritionist with a food diary recorded for two weeks prior to the visit.    Probably will need 02 for wt control  Problem # 3:   RESPIRATORY FAILURE, CHRONIC (ICD-518.83) Previously eval with CT chest and echocardiogram (without bubble study) -  will need to repeat the w/u this time with a bubble study.   Will need to re- explore option of 02 with ex and maybe while sleeping to improve ex tol and reduce risk of  secondary PAH  Medications Added to Medication List This Visit: 1)  Diovan Hct 160-25 Mg Tabs (Valsartan-hydrochlorothiazide) .Marland Kitchen.. 1 once daily 2)  Vitamin D3 1000 Unit Caps (Cholecalciferol) .Marland Kitchen.. 1 once daily 3)  Azopt 1 % Susp (Brinzolamide) .Marland Kitchen.. 1 drop each eye two times a day 4)  Travatan Z 0.004 % Soln (Travoprost) .Marland Kitchen.. 1 drop in each eye at bedtime 5)  Dulera 100-5 Mcg/act Aero (Mometasone furo-formoterol fum) .... 2 puffs first thing  in am and 2 puffs again in pm about 12 hours later 6)  Dexilant 60  Mg Cpdr (Dexlansoprazole) .... Take  one 30-60 min before first meal of the day  Other Orders: Est. Patient Level IV [id number]) HFA Instruction [id number]) Pulse Oximetry, Ambulatory [id number])  Patient Instructions: 1)  stop advair and spiriva 2)  start dulera 100 2 puffs first thing  in am and 2 puffs again in pm about 12 hours later and Work on inhaler technique:  relax and blow all the way out then take a nice smooth deep breath back in, triggering the inhaler at same time you start breathing in  3)  GERD (REFLUX)  is a common cause of respiratory symptoms. It commonly presents without heartburn and can be treated with medication, but also with lifestyle changes including avoidance of late meals, excessive alcohol, smoking cessation, and avoid fatty foods, chocolate, peppermint, colas, red wine, and acidic juices such as orange juice. NO MINT OR MENTHOL PRODUCTS SO NO COUGH DROPS  4)  USE SUGARLESS CANDY INSTEAD (jolley ranchers)  5)  NO OIL BASED VITAMINS  6)  Dexilant 60 Take  one 30-60 min before first meal of the day  7)  Please schedule a follow-up appointment in 4 weeks, sooner if needed with PFT's on  return  Appended Document: Pulmlonary/ ext ov with hfa teaching and amb 02 copy to Dr Kelton Pillar  Appended Document: Pulmlonary/ ext ov with hfa teaching and amb 02 Copy faxed to [facility] via [employer].

## 2010-12-05 NOTE — Miscellaneous (Signed)
Summary: Orders Update pft charges  Clinical Lists Changes  Orders: Added new Service order of Carbon Monoxide diffusing w/capacity (94720) - Signed Added new Service order of Lung Volumes (94240) - Signed Added new Service order of Spirometry (Pre & Post) (94060) - Signed 

## 2010-12-05 NOTE — Assessment & Plan Note (Signed)
Summary: Pulmonary/ ext ov with hfa teaching   Copy to:  self Primary Provider/Referring Provider:  Dr. Kelton Pillar @ [facility]  CC:  3 month followup.  Breathing is just a little better form last time was here and Pt states her breathing is the only thing currently affecting her.  History of Present Illness: 21 yobf never smoker with chronic asthma  seen by [facility] in 2009 with chronic doe with desat after 500 ft walking  for clearance for right hip surgery which went fine.  November 10, 2009 cc  much worse breathing since  Oct 2010 with lots of drainage of sinuses which improved but breathing never did.  Sob across the room.  was coughing not now. mostly with walking but also occ lying down.  no real variability.   rec try off advair and spiriva and teach dulera and PPI but ran out 2/5, restart hfa/ teach technique  January 04, 2010 2 wk folliowup.  Pt states that her breathing is about 50% improved since last visit.  She states that advair seemed to help some but she ran out of this 1 wk ago. While on advair continued to feel need for saba at least twice daily. rec try symbicort and as needed xopenex.  February 03, 2010 2 week rov - cough is much better,  sleeps on ok using chrlortirmeton effectively. 4 wk followup. rec amb 02, failed to use it. approved for noct 02  May 11, 2010 ov Breathing is just a little better form last time was here, Pt states her breathing is the only thing currently affecting her.  Sleeps at night on 02 2 lpm comfortably without am cough or congestion. No cp. still using hfa effectively.  Pt denies any significant sore throat, dysphagia, itching, sneezing,  nasal congestion or excess secretions,  fever, chills, sweats, unintended wt loss, pleuritic or exertional cp, hempoptysis, worsening  in activity tolerance  orthopnea pnd or leg swelling. Pt also denies any obvious fluctuation in symptoms with weather or environmental change or other alleviating or aggravating  factors.     Pt denies any increase in rescue therapy over baseline, denies waking up needing it or having early am exacerbations of coughing/wheezing/ or dyspnea - hasn't tried saba before activity  Preventive Screening-Counseling & Management  Alcohol-Tobacco     Smoking Status: never  Current Medications (verified): 1)  Actoplus Met 15-850 Mg  Tabs (Pioglitazone Hcl-Metformin Hcl) .... Three Times A Day 2)  Lexapro 10 Mg  Tabs (Escitalopram Oxalate) .... Once Daily 3)  Diovan Hct 160-25 Mg Tabs (Valsartan-Hydrochlorothiazide) .Marland Kitchen.. 1 Once Daily 4)  Simvastatin 80 Mg  Tabs (Simvastatin) .... Once Daily 5)  Multi For Her 50+   Tabs (Multiple Vitamins-Minerals) .... Once Daily 6)  Vitamin D3 1000 Unit Caps (Cholecalciferol) .Marland Kitchen.. 1 Once Daily 7)  Azopt 1 % Susp (Brinzolamide) .Marland Kitchen.. 1 Drop Each Eye Two Times A Day 8)  Travatan Z 0.004 % Soln (Travoprost) .Marland Kitchen.. 1 Drop in Each Eye At Bedtime 9)  Symbicort 160-4.5 Mcg/act  Aero (Budesonide-Formoterol Fumarate) .... Two Puffs Twice Daily 10)  Proair Hfa 108 (90 Base) Mcg/act Aers (Albuterol Sulfate) .... 2 Puffs Every 4 Hours As Needed 11)  Pepcid Ac Maximum Strength 20 Mg Tabs (Famotidine) .... One in Am and One At Bedtime 12)  Chlor-Trimeton 4 Mg Tabs (Chlorpheniramine Maleate) .Marland Kitchen.. 1 Every 6 Hours As Needed  Allergies (verified): No Known Drug Allergies  Past History:  Past Medical History: Allergic  Rhinitis Diabetes Hyperlipidemia Hypertension Obesity - BMI 34, April 2009.  No h/o anorectic drug use ? Pulmonary Hypertension suggested CT chest Jan 2008 >  Normal ECHO 03/2008  Normal resting abg - 03/04/2008 6 minute walk test 03/04/2008 - 500 feet with desat to 82% at 2 minutes - refused O2      - Accepted amb 02 February 04, 2010 but did not start it until May 11, 2010       - Overnight pulse ox 02/08/10  >>> desat x 11 h, repeat on 2lpm ordered May 11, 2010  Chronic asthma       -  PFTs 03/04/2008 - Fev1 1.26L/59% Ratio 67  FEV1  13%  better after B2  DLCO 12.6/49%       -  PFT's December 12, 2009 1.13(54%) ratio 62     with 10% p  B2                DLC0 12 /51%        -HFA  75% on rechallenge January 04, 2010 > 75% February 03, 2010 > 90% after coaching May 11, 2010   Vital Signs:  Patient profile:   70 year old female Height:      65 inches Weight:      211 pounds O2 Sat:      94 % on Room air Temp:     97.9 degrees F oral Pulse rate:   84 / minute BP sitting:   150 / 80  (right arm) Cuff size:   regular  Vitals Entered By: Tilden Dome (May 11, 2010 8:56 AM)  O2 Flow:  Room air  Serial Vital Signs/Assessments:  Comments: 9:47 AM Ambulatory Pulse Oximetry  Resting; HR__93___    02 Sat__98%2lpm___  Lap1 (185 feet)   HR__116___   02 Sat__90%2lpm___ Lap2 (185 feet)   HR__120___   02 Sat__86%2lpm___    Lap3 (185 feet)   HR_____   02 Sat_____  ___Test Completed without Difficulty _x__Test Stopped due to: o2 sats dropped to 86%2lpm and pt c/o 'feeling tired'   By: Tilden Dome   CC: 3 month followup.  Breathing is just a little better form last time was here, Pt states her breathing is the only thing currently affecting her Comments meds and allergies updated Tilden Dome  May 11, 2010 8:58 AM    Physical Exam  Additional Exam:  wt 220 November 10, 2009 > 215 December 12, 2009 > 214 January 04, 2010 > 210 February 03, 2010 > 211 May 11, 2010  obese amb bf nad HEENT: nl dentition, turbinates, and orophanx. Nl external ear canals without cough reflex NECK :  without JVD/Nodes/TM/ nl carotid upstrokes bilaterally LUNGS: no acc muscle use, distant bs bilaterally without cough on insp or exp maneuvers CV:  RRR  no s3 or murmur or increase in P2, no edema   ABD:  soft and nontender with nl excursion in the supine position. No bruits or organomegaly, bowel sounds nl MS:  warm without deformities, calf tenderness, cyanosis or clubbing       Impression & Recommendations:  Problem # 1:  ASTHMA,  UNSPECIFIED (ICD-493.90)  Severe chronic asthma and obesity deconditioning combined    DDX of  difficult airways managment all start with A and  include Adherence, Ace Inhibitors, Acid Reflux, Active Sinus Disease, Alpha 1 Antitripsin deficiency, Anxiety masquerading as Airways dz,  ABPA,  allergy(esp in young), Aspiration (esp in elderly), Adverse effects of DPI,  Active smokers, plus one B  = Beta blocker use..      I spent extra time with the patient today explaining optimal mdi  technique.  This improved from  25 -90% after coaching  if not able to maintain better hfa option : change to Brovana and Budesonide, the equivalent of Symbicort, per neb since advair made her cough  Problem # 2:  RESPIRATORY FAILURE, CHRONIC (ICD-518.83) I had an extended discussion with the patient today lasting 15 to 20 minutes of a 25 minute visit on the following issues:   Did not understand prev instructions re meds/ 02.    Each maintenance medication was reviewed in detail including most importantly the difference between maintenance and as needed and under what circumstances the prns are to be used. See instructions for specific recommendations   Medications Added to Medication List This Visit: 1)  *02 2lpm Sleeping and With Activity   Other Orders: HFA Instruction [id number]) Pulse Oximetry, Ambulatory (94761) Misc. Referral (Misc. Ref) Est. Patient Level IV [id number])  Patient Instructions: 1)  See Patient Care Coordinator before leaving for repeat overnight oxygen testing on 02 @ 2lpm 2)  Wear 02 with any activity more than room to room walking 3)  Work on inhaler technique:  relax and blow all the way out then take a nice smooth deep breath back in, triggering the inhaler at same time you start breathing in, hold a few seconds, then rinse and gargle 4)  Please schedule a follow-up appointment in 6 weeks, sooner if needed

## 2010-12-05 NOTE — Assessment & Plan Note (Signed)
Summary: Pulmonary/ ext ov with walking sats = 81%   Copy to:  self Primary Provider/Referring Provider:  Dr. Kelton Pillar @ [facility]  CC:  4 wk followup.  Pt states that her breathing is the same- no better or worse.  Pt states that .  History of Present Illness: 70 yobf never smoker seen by [facility] in 2009 with chronic doe with desat after 500 ft walking  for clearance for right hip surgery which went fine.  November 10, 2009 cc  much worse breathing since  Oct 2010 with lots of drainage of sinuses which improved but breathing never did.  Sob across the room.  was coughing not now. mostly with walking but also occ lying down.  no real variability.   rec try off advair and spiriva and teach dulera and PPI but ran out 2/5   December 12, 2009 4 wk follow up with PFTs.  states breathing is has been getting worse since October-having increased SOB with activity and at rest.  not using inhalers correctly. no sign cough.  January 04, 2010 2 wk folliowup.  Pt states that her breathing is about 50% improved since last visit.  She states that advair seemed to help some but she ran out of this 1 wk ago. While on advair continued to feel need for saba at least twice daily. rec try symbicort and as needed xopenex.  February 03, 2010 2 week rov - cough is much better,  sleeps on ok using chrlortirmeton effectively. 4 wk followup.  Pt states that her breathing is the same- no better or worse.   Pt denies any significant sore throat, dysphagia, itching, sneezing,  nasal congestion or excess secretions,  fever, chills, sweats, unintended wt loss, pleuritic or exertional cp, hempoptysis, change in activity tolerance  orthopnea pnd or leg swelling     Current Medications (verified): 1)  Actoplus Met 15-850 Mg  Tabs (Pioglitazone Hcl-Metformin Hcl) .... Three Times A Day 2)  Lexapro 10 Mg  Tabs (Escitalopram Oxalate) .... Once Daily 3)  Diovan Hct 160-25 Mg Tabs (Valsartan-Hydrochlorothiazide) .Marland Kitchen.. 1 Once  Daily 4)  Simvastatin 80 Mg  Tabs (Simvastatin) .... Once Daily 5)  Multi For Her 50+   Tabs (Multiple Vitamins-Minerals) .... Once Daily 6)  Mucinex D [phone] Mg  Tb12 (Pseudoephedrine-Guaifenesin) .... As Needed 7)  Vitamin D3 1000 Unit Caps (Cholecalciferol) .Marland Kitchen.. 1 Once Daily 8)  Azopt 1 % Susp (Brinzolamide) .Marland Kitchen.. 1 Drop Each Eye Two Times A Day 9)  Travatan Z 0.004 % Soln (Travoprost) .Marland Kitchen.. 1 Drop in Each Eye At Bedtime 10)  Xopenex Hfa 45 Mcg/act Aero (Levalbuterol Tartrate) .... 2 Puffs Every 4 Hours As Needed 11)  Pepcid Ac Maximum Strength 20 Mg Tabs (Famotidine) .... One in Am and One At Bedtime 12)  Symbicort 160-4.5 Mcg/act  Aero (Budesonide-Formoterol Fumarate) .... Two Puffs Twice Daily 13)  Chlor-Trimeton 4 Mg Tabs (Chlorpheniramine Maleate) .Marland Kitchen.. 1 Every 6 Hours As Needed  Allergies (verified): No Known Drug Allergies  Past History:  Past Medical History: Allergic Rhinitis Diabetes Hyperlipidemia Hypertension Obesity - BMI 34, April 2009.  No h/o anorectic drug use ? Pulmponary Hypertension suggested CT chest Jan 2008 >  Normal ECHO 03/2008  Normal resting abg - 03/04/2008 6 minute walk test 03/04/2008 - 500 feet with desat to 82% at 2 minutes - refused O2      - Accepted amb 02 February 04, 2010      - Overnight pulse  ox ordered February 04, 2010 >>> Chronic asthma       -  PFTs 03/04/2008 - Fev1 1.26L/59% Ratio 67  FEV1  13% better after B2  DLCO 12.6/49%       -  PFT's December 12, 2009 1.13(54%) ratio 62     with 10% p  B2                DLC0 12 /51%        - HFA 25%,  DPI 75% December 12, 2009 > HFA  75% on rechallenge January 04, 2010 > 75% February 03, 2010   Vital Signs:  Patient profile:   70 year old female Weight:      210 pounds O2 Sat:      90 % on Room air Temp:     97.8 degrees F oral Pulse rate:   90 / minute BP sitting:   134 / 80  (left arm) Cuff size:   large  Vitals Entered By: Tilden Dome (February 03, 2010 9:16 AM)  O2 Flow:  Room air  Serial Vital  Signs/Assessments:  Comments: 9:54 AM Ambulatory Pulse Oximetry  Resting; HR__89___    02 Sat__90%ra___  Lap1 (185 feet)   HR__110___   02 Sat__81%ra___ after lap 1 placed pt on o2 at 2lpm o2 sat increased to 95%2lpm Lap2 (185 feet)   HR__101___   02 Sat__91%2lpm___    Lap3 (185 feet)   HR_____   02 Sat_____  ___Test Completed without Difficulty ___Test Stopped due to:   By: Tilden Dome    Physical Exam  Additional Exam:  wt 220 November 10, 2009 > 215 December 12, 2009 > 214 January 04, 2010 > 210 February 03, 2010  obese amb bf nad HEENT: nl dentition, turbinates, and orophanx. Nl external ear canals without cough reflex NECK :  without JVD/Nodes/TM/ nl carotid upstrokes bilaterally LUNGS: no acc muscle use, clear bilaterally  without cough on insp or exp maneuvers CV:  RRR  no s3 or murmur or increase in P2, no edema   ABD:  soft and nontender with nl excursion in the supine position. No bruits or organomegaly, bowel sounds nl MS:  warm without deformities, calf tenderness, cyanosis or clubbing       Impression & Recommendations:  Problem # 1:  ASTHMA, UNSPECIFIED (ICD-493.90) Severe chronic asthma and obesity deconditioning combined    DDX of  difficult airways managment all start with A and  include Adherence, Ace Inhibitors, Acid Reflux, Active Sinus Disease, Alpha 1 Antitripsin deficiency, Anxiety masquerading as Airways dz,  ABPA,  allergy(esp in young), Aspiration (esp in elderly), Adverse effects of DPI,  Active smokers, plus one B  = Beta blocker use..      I spent extra time with the patient today explaining optimal mdi  technique.  This improved from  50-75%   Each maintenance medication was reviewed in detail including most importantly the difference between maintenance and as needed and under what circumstances the prns are to be used. See instructions for specific recommendations   Problem # 2:  RESPIRATORY FAILURE, CHRONIC (ICD-518.83)  Orders: Pulse  Oximetry, Ambulatory (94761) Misc. Referral (Misc. Ref)  This is a chronic issue and surprising in a never smoker so rx with amb 02 and check night time ox as well   Problem # 3:  WEIGHT GAIN, ABNORMAL (ICD-783.1)  Weight control is a matter of calorie balance which needs  to be tilted in the pt's favor by eating less and exercising more.  Specifically, I recommended  exercise at a level where pt  is short of breath but not out of breath 30 minutes daily while wearing 02 and pacing herself.  If not losing weight on this program, I would strongly recommend pt see a nutritionist with a food diary recorded for two weeks prior to the visit.  Declined referral to rehab    Medications Added to Medication List This Visit: 1)  Chlor-trimeton 4 Mg Tabs (Chlorpheniramine maleate) .Marland Kitchen.. 1 every 6 hours as needed  Other Orders: T-2 View CXR ([id number]) Est. Patient Level IV [id number])  Patient Instructions: 1)  See Patient Care Coordinator before leaving for for ambulatory 02 @ 2lpm if you want it and overnight 02 sats 2)  Return to office in 3 months, sooner if needed 3)  Use xopenex 2puffs every 4 hours if needed Prescriptions: XOPENEX HFA 45 MCG/ACT AERO (LEVALBUTEROL TARTRATE) 2 puffs every 4 hours as needed  #1 x 11   Entered and Authorized by:   Tanda Rockers MD   Signed by:   Tanda Rockers MD on 02/03/2010   Method used:   Electronically to        [facility] [phone]* (retail)       [address] [place], [place]  [address]       Ph: [phone]       Fax: [phone]   RxID:   [id number]

## 2010-12-05 NOTE — Progress Notes (Signed)
Summary: sample  Phone Note Call from Patient Call back at Home Phone [phone]   Caller: Patient Call For: wert Reason for Call: Talk to Nurse Summary of Call: xopenex HFA inhaler - can she get a sample? Initial call taken by: Zigmund Gottron,  December 21, 2009 4:48 PM  Follow-up for Phone Call        No samples available of this med today. Spoke with pt and made aware of this.  I asked if she wanted me to call in rx for this and she refused.   Follow-up by: Tilden Dome,  December 21, 2009 5:05 PM

## 2010-12-05 NOTE — Progress Notes (Signed)
Summary: samples    Phone Note Call from Patient Call back at Home Phone [phone]   Caller: Patient Call For: wert Summary of Call: Wants samples or rx for symbicort 160-4.5 micrograms//[facility] Initial call taken by: Netta Neat,  January 27, 2010 10:45 AM  Follow-up for Phone Call        [facility].  pt just saw MW 01-04-2010.  Was she given samples then?  How many boxes?  Why is she already needing another sample?  Jinny Blossom Reynolds LPN  March 25, 624THL 075-GRM AM    LMOMTCBx2.  Jinny Blossom Reynolds LPN  March 25, 624THL [id number] PM   pt was given 2 samples on 3-2 and states she finished the second inahler yesterday. I asked pt how she was using it and she staetd 2 puffs twice daily. I advised she should have some inhalations left. SHe states she has been using it as directed and the dose meter shows inahler is on the red. I advised I will send in rx to her pharmacy, but that she will only be able to get one inhaler a month. Pt states understanding. She has an appt with MW on Friday 02-03-10.[place] Bing CMA  January 30, 2010 9:44 AM      Prescriptions: SYMBICORT 160-4.5 MCG/ACT  AERO (BUDESONIDE-FORMOTEROL FUMARATE) Two puffs twice daily  #1 x 3   Entered by:   [place] Bing CMA   Authorized by:   Tanda Rockers MD   Signed by:   [place] Bing CMA on 01/30/2010   Method used:   Electronically to        [facility]. [phone]* (retail)       [address] [place], [place]  [address]       Ph: [phone]       Fax: [phone]   RxID:   [id number]

## 2010-12-05 NOTE — Assessment & Plan Note (Signed)
Summary: Pulmonary/ f/u ov with hfa 90, sats ra walking ok x 90 ft only   Visit Type:  Follow-up Copy to:  self Primary Provider/Referring Provider:  Dr. Kelton Pillar @ [facility]  CC:  Follow-up.  ONO results.  The patient c/o increased sob with exertion and chest tightness x1 week....  History of Present Illness: 70 yobf never smoker with chronic asthma  seen by [facility] in 2009 with chronic doe with desat after 500 ft walking  for clearance for right hip surgery which went fine.  January  70, 2011 cc  much worse breathing since  Oct 2010 with lots of drainage of sinuses which improved but breathing never did.  Sob across the room.  was coughing not now. mostly with walking but also occ lying down.  no real variability.   rec try off advair and spiriva and teach dulera and PPI but ran out 2/5, restart hfa/ teach technique  March  70, 2011 2 wk folliowup.  Pt states that her breathing is about 50% improved since last visit.  She states that advair seemed to help some but she ran out of this 1 wk ago. While on advair continued to feel need for saba at least twice daily. rec try symbicort and as needed xopenex.  April  70, 2011 2 week rov - cough is much better,  sleeps on ok using chrlortirmeton effectively. 4 wk followup. rec amb 02, failed to use it. approved for noct 02  July  70, 2011 ov Breathing is just a little better form last time was here, Pt states her breathing is the only thing currently affecting her.  Sleeps at night on 02 2 lpm comfortably without am cough or congestion. No cp. still using hfa effectively.  rec Wear [id number] with any activity more than room to room walking Work on inhaler technique:  relax and blow all the way out then take a nice smooth deep breath back in, triggering the inhaler at same time you start breathing in, hold a few seconds, then rinse and gargle  August 70, 2011 Follow-up.  ONO results.  The patient c/o increased sob with exertion and chest tightness x1 week  when not wearing 02 with activity, doing better on 02 when she wears it. no cough Pt denies any significant sore throat, dysphagia, itching, sneezing,  nasal congestion or excess secretions,  fever, chills, sweats, unintended wt loss, pleuritic or exertional cp, hempoptysis, change in activity tolerance  orthopnea pnd or leg swelling    Preventive Screening-Counseling & Management  Alcohol-Tobacco     Smoking Status: never     Passive Smoke Exposure: yes  Current Medications (verified): 1)  Actoplus Met 15-850 Mg  Tabs (Pioglitazone Hcl-Metformin Hcl) .... Three Times A Day 2)  Citalopram Hydrobromide 40 Mg Tabs (Citalopram Hydrobromide) .... 1/2 By Mouth Daily 3)  Diovan Hct 160-25 Mg Tabs (Valsartan-Hydrochlorothiazide) .Marland Kitchen.. 1 Once Daily 4)  Simvastatin 80 Mg  Tabs (Simvastatin) .... Once Daily 5)  Multi For Her 50+   Tabs (Multiple Vitamins-Minerals) .... Once Daily 6)  Vitamin D3 1000 Unit Caps (Cholecalciferol) .Marland Kitchen.. 1 Once Daily 7)  Azopt 1 % Susp (Brinzolamide) .Marland Kitchen.. 1 Drop Each Eye Two Times A Day 8)  Travatan Z 0.004 % Soln (Travoprost) .Marland Kitchen.. 1 Drop in Each Eye At Bedtime 9)  Symbicort 160-4.5 Mcg/act  Aero (Budesonide-Formoterol Fumarate) .... Two Puffs Twice Daily 10)  Proair Hfa 108 (90 Base) Mcg/act Aers (Albuterol Sulfate) .... 2 Puffs Every 4 Hours As  Needed 11)  Pepcid Ac Maximum Strength 20 Mg Tabs (Famotidine) .... One in Am and One At Bedtime 12)  Chlor-Trimeton 4 Mg Tabs (Chlorpheniramine Maleate) .Marland Kitchen.. 1 Every 6 Hours As Needed 13)  *02 2lpm Sleeping and With Activity  Allergies (verified): No Known Drug Allergies  Past History:  Past Medical History: Allergic Rhinitis Diabetes Hyperlipidemia Hypertension Obesity - BMI 34, April 2009.  No h/o anorectic drug use ? Pulmonary Hypertension suggested CT chest Jan 2008 >  Normal ECHO 03/2008  Normal resting abg - 03/04/2008 6 minute walk test 03/04/2008 - 500 feet with desat to 82% at 2 minutes - refused O2      -  Accepted amb 02 February 04, 2010 but did not start it until May 11, 2010       - Overnight pulse ox 02/08/10  >>> desat x 11 h, repeat on 2lpm 05/18/2010 > no desat on 2lpm Chronic asthma       -  PFTs 03/04/2008 - Fev1 1.26L/59% Ratio 67  FEV1  13% better after B2  DLCO 12.6/49%       -  PFT's December 12, 2009 1.13(54%) ratio 62     with 10% p  B2                DLC0 12 /51%        -HFA  75% on rechallenge January 04, 2010 > 75% February 03, 2010 > 90% after coaching May 11, 2010   Vital Signs:  Patient profile:   70 year old female Height:      65 inches (165.10 cm) Weight:      212.50 pounds (55.23 kg) BMI:     20.29 O2 Sat:      91 % on Room air Temp:     98.4 degrees F (36.89 degrees C) oral Pulse rate:   89 / minute BP sitting:   122 / 62  (left arm) Cuff size:   large  Vitals Entered By: Francesca Jewett CMA (August 72, 2011 9:20 AM)  O2 Sat at Rest %:  91 O2 Flow:  Room air  Serial Vital Signs/Assessments:  Comments: 9:54 AM Ambulatory Pulse Oximetry  Resting; HR___79__    02 Sat__90% on room air___  Lap1 (185 feet)   HR__109___   02 Sat__86% on room air, placed on 2 liters oxygen and sats recovered to 95%___ Lap2 (185 feet)   HR_100___   02 Sat__90% on 2 liters___    Lap3 (185 feet)   HR__108___   02 Sat__88% on 2 liters, oxygen bumped to 3 liters and sats recovered to 94% and pt finished 3rd lap___  ___Test Completed without Difficulty _x__Test Stopped due to: drop in sats while walking  By: Francesca Jewett CMA   CC: Follow-up.  ONO results.  The patient c/o increased sob with exertion and chest tightness x1 week... Is Patient Diabetic? Yes Comments Medications reviewed with the patient. Daytime phone verified. Francesca Jewett [facility]  June 22, 2010 9:21 AM   Physical Exam  Additional Exam:  wt 220 November 10, 2009 > 215 December 12, 2009 > 214 January 04, 2010 > 210 February 03, 2010 > 211 May 11, 2010 > 212 June 22, 2010  obese amb bf nad HEENT: nl dentition, turbinates, and  orophanx. Nl external ear canals without cough reflex NECK :  without JVD/Nodes/TM/ nl carotid upstrokes bilaterally LUNGS: no acc muscle use, distant bs bilaterally  without cough on insp or exp maneuvers CV:  RRR  no s3 or murmur or increase in P2, no edema   ABD:  soft and nontender with nl excursion in the supine position. No bruits or organomegaly, bowel sounds nl MS:  warm without deformities, calf tenderness, cyanosis or clubbing       Impression & Recommendations:  Problem # 1:  RESPIRATORY FAILURE, CHRONIC (ICD-518.83) Still struggling with instructions re approapriate use of 02  See instructions for specific recommendations   Problem # 2:  ASTHMA, UNSPECIFIED (ICD-493.90) I spent extra time with the patient today explaining optimal mdi  technique.  This improved from  75-90% p coaching.  Most likely she has severe chronic asthma and obesity combining to cause desats with ex.     Each maintenance medication was reviewed in detail including most importantly the difference between maintenance and as needed and under what circumstances the prns are to be used. See instructions for specific recommendations   Medications Added to Medication List This Visit: 1)  Citalopram Hydrobromide 40 Mg Tabs (Citalopram hydrobromide) .... 1/2 by mouth daily 2)  *02 2lpm Sleeping and With Activity More Than Room To Room   Other Orders: Misc. Referral (Misc. Ref) Est. Patient Level III [id number]) Pulse Oximetry, Ambulatory [id number])  Patient Instructions: 1)   Wear your 0xygen automatically whenever doing more than room to room walking 2)  See Patient Care Coordinator before leaving for oxylyte 3)  Return to office in 3 months, sooner if needed

## 2010-12-05 NOTE — Procedures (Signed)
Summary: Oximetry/[facility]  Oximetry/[facility]   Imported By: Edmonia James 02/17/2010 11:14:52  _____________________________________________________________________  External Attachment:    Type:   Image     Comment:   External Document

## 2010-12-05 NOTE — Progress Notes (Signed)
Summary: PA xopenex hfa > ok to try proaire 1st  Phone Note Outgoing Call Call back at [facility] Phone [phone]   Call placed by: Parke Poisson CNA,  February 08, 2010 5:11 PM Call placed to: [employer] Summary of Call: called ins complany @ [phone].  awaiting form to be faxed to triage for xopenexl hfa; no other alternatives that do not require a prio auth. Initial call taken by: Parke Poisson CNA,  February 08, 2010 5:12 PM  Follow-up for Phone Call        form received, pt information filled out and placed in MW's look out to review and sign. Parke Poisson CNA  February 08, 2010 5:32 PM   Form faxed, xopenex hfa has been denied until pt tries proair and maxair first and fails. LMTCB to advised pt of this before rx sent.  Bing CMA  February 10, 2010 2:51  returned phone call  Netta Neat  February 10, 2010 4:37 PM   Follow-up by: Netta Neat,  February 10, 2010 4:37 PM  Additional Follow-up for Phone Call Additional follow up Details #1::        ok to use proaire first Additional Follow-up by: Tanda Rockers MD,  February 10, 2010 4:50 PM    Additional Follow-up for Phone Call Additional follow up Details #2::    Spoke with pt.  She is aware insurance will not cover xopenex until she tries and fails proair and Secretary/administrator.  She is aware of MW's recs to try proair 1st.  Pt aware proair rx sent to [facility].  She  verbalized understanding.  Raymondo Band RN  February 13, 2010 9:34 AM   New/Updated Medications: PROAIR HFA 108 (90 BASE) MCG/ACT AERS (ALBUTEROL SULFATE) 2 puffs every 4 hours as needed Prescriptions: PROAIR HFA 108 (90 BASE) MCG/ACT AERS (ALBUTEROL SULFATE) 2 puffs every 4 hours as needed  #1 x 3   Entered by:   Raymondo Band RN   Authorized by:   Tanda Rockers MD   Signed by:   Raymondo Band RN on 02/13/2010   Method used:   Electronically to        [facility]. [phone]* (retail)       [address] [place],  [place]  [address]       Ph: [phone]       Fax: [phone]   RxID:   [id number]

## 2010-12-07 NOTE — Progress Notes (Signed)
Summary: nuc pre-procedure  Phone Note Outgoing Call   Call placed by: Charlton Amor, CNMT,  November 15, 2010 12:05 PM Call placed to: Patient Reason for Call: Confirm/change Appt Summary of Call: Left message with information on Myoview Information Sheet (see scanned document for details).      Nuclear Med Background Indications for Stress Test: Evaluation for Ischemia   History: Ablation, Asthma, Echo  History Comments: 10/03/10  Echo EF=65%; Ablation > A-Flutter; on chronic O2  Symptoms: Chest Pressure with Exertion    Nuclear Pre-Procedure Cardiac Risk Factors: Hypertension, Lipids, NIDDM Height (in): 65

## 2010-12-07 NOTE — Miscellaneous (Signed)
Summary: Orders Update  Clinical Lists Changes  Problems: Added new problem of CARDIOMEGALY (ICD-429.3) Orders: Added new Referral order of CT Scan  (CT Scan) - Signed

## 2010-12-07 NOTE — Assessment & Plan Note (Addendum)
Summary: Cardiology Nuclear Testing  Nuclear Med Background Indications for Stress Test: Evaluation for Ischemia   History: Ablation, Asthma, Echo  History Comments: 10/03/10  Echo EF=65%; Ablation > A-Flutter; on chronic O2  Symptoms: Chest Tightness, Chest Tightness with Exertion, DOE, SOB  Symptoms Comments: Last episode of CP was this past week-end.   Nuclear Pre-Procedure Cardiac Risk Factors: Hypertension, Lipids, NIDDM Caffeine/Decaff Intake: None NPO After: 7:30 PM Lungs: Clear IV 0.9% NS with Angio Cath: 22g     IV Site: R Antecubital IV Started by: Irven Baltimore, RN Chest Size (in) 44     Cup Size B     Height (in): 65 Weight (lb): 202 BMI: 33.74 Tech Comments: Patient used her inhaler prior to [place] injection.  Nuclear Med Study 1 or 2 day study:  1 day     Stress Test Type:  Lexiscan Reading MD:  Jenkins Rouge, MD     Referring MD:  Dr. Jolyn Nap Resting Radionuclide:  Technetium 24m Tetrofosmin     Resting Radionuclide Dose:  11.0 mCi  Stress Radionuclide:  Technetium 44m Tetrofosmin     Stress Radionuclide Dose:  33.0 mCi   Stress Protocol      Max HR:  90 bpm     Predicted Max HR:  123456 bpm  Max Systolic BP: [id number] mm Hg     Percent Max HR:  60.40 %Rate Pressure Product:  11700  Lexiscan: 0.4 mg   Stress Test Technologist:  Crissie Figures, RN     Nuclear Technologist:  Annye Rusk, CNMT  Rest Procedure  Myocardial perfusion imaging was performed at rest 45 minutes following the intravenous administration of Technetium 64m Tetrofosmin.  Stress Procedure  The patient received IV Lexiscan 0.4 mg over 15-seconds.  Technetium 96m Tetrofosmin injected at 30-seconds.  There were no significant changes with infusion. Occ. PAC's noted.  Quantitative spect images were obtained after a 45 minute delay.  QPS Raw Data Images:  Motion especially on stess images Stress Images:  Normal homogeneous uptake in all areas of the myocardium. Rest Images:  Normal homogeneous  uptake in all areas of the myocardium. Subtraction (SDS):  SDS 1 Transient Ischemic Dilatation:  0.90  (Normal <1.22)  Lung/Heart Ratio:  0.25  (Normal <0.45)  Quantitative Gated Spect Images QGS EDV:  83 ml QGS ESV:  21 ml QGS EF:  75 % QGS cine images:  normal  Findings Low risk nuclear study      Overall Impression  Exercise Capacity: Lexiscan with no exercise. BP Response: Normal blood pressure response. Clinical Symptoms: No chest pain ECG Impression: No significant ST segment change suggestive of ischemia. Overall Impression: Breast Attenuation no evidence of ischemia  Appended Document: Cardiology Nuclear Testing adv Mrs. Grawe of results. Joan Flores, RN, BSN

## 2010-12-07 NOTE — Assessment & Plan Note (Signed)
Summary: eph/post ablation/amber   Visit Type:  Follow-up Referring Provider:  self Primary Provider:  Dr. Kelton Pillar @ [facility]  CC:  chest tightness .  History of Present Illness: [name] is seen in followup for hospitalization recently because of atrial flutter for which he underwent catheter ablation. She comes in today with complaints of just not feeling well and being quite H. is about her heart still been a problem. She describes chest tightness with exertion. Cardiac risk factors include dyslipidemia, diabetes, and hypertension. She also has a family history.  At her hospitalization she was found to have an abnormal chest x-ray; a chest CT was done this morning. The results are pending. She has significant asthma for which she is followed by Dr. Woodroe Mode .  She is on chronic oxygen.  Current Medications (verified): 1)  Actoplus Met 15-850 Mg  Tabs (Pioglitazone Hcl-Metformin Hcl) .... Three Times A Day 2)  Citalopram Hydrobromide 40 Mg Tabs (Citalopram Hydrobromide) .... 1/2 By Mouth Daily 3)  Diovan Hct 160-25 Mg Tabs (Valsartan-Hydrochlorothiazide) .Marland Kitchen.. 1 Once Daily 4)  Simvastatin 80 Mg  Tabs (Simvastatin) .... 1/2 Tab Daily 5)  Multi For Her 50+   Tabs (Multiple Vitamins-Minerals) .... Once Daily 6)  Vitamin D3 1000 Unit Caps (Cholecalciferol) .Marland Kitchen.. 1 Once Daily 7)  Azopt 1 % Susp (Brinzolamide) .Marland Kitchen.. 1 Drop Each Eye Two Times A Day 8)  Travatan Z 0.004 % Soln (Travoprost) .Marland Kitchen.. 1 Drop in Each Eye At Bedtime 9)  *02 2lpm Sleeping and With Activity More Than Room To Room 10)  Chlor-Trimeton 4 Mg Tabs (Chlorpheniramine Maleate) .Marland Kitchen.. 1 Every 6 Hours As Needed 11)  Pradaxa 150 Mg Caps (Dabigatran Etexilate Mesylate) .Marland Kitchen.. 1tab Q 12 Hours 12)  Qvar 80 Mcg/act Aers (Beclomethasone Dipropionate) .Marland Kitchen.. 1 Inhalation Q 12 Hours 13)  Januvia 100 Mg Tabs (Sitagliptin Phosphate) .Marland Kitchen.. 1 Tab Qd  Allergies (verified): No Known Drug Allergies  Past History:  Past Medical  History: Last updated: 06/22/2010 Allergic Rhinitis Diabetes Hyperlipidemia Hypertension Obesity - BMI 34, April 2009.  No h/o anorectic drug use ? Pulmonary Hypertension suggested CT chest Jan 2008 >  Normal ECHO 03/2008  Normal resting abg - 03/04/2008 6 minute walk test 03/04/2008 - 500 feet with desat to 82% at 2 minutes - refused O2      - Accepted amb 02 February 04, 2010 but did not start it until May 11, 2010       - Overnight pulse ox 02/08/10  >>> desat x 11 h, repeat on 2lpm 05/18/2010 > no desat on 2lpm Chronic asthma       -  PFTs 03/04/2008 - Fev1 1.26L/59% Ratio 67  FEV1  13% better after B2  DLCO 12.6/49%       -  PFT's December 12, 2009 1.13(54%) ratio 62     with 10% p  B2                DLC0 12 /51%        -HFA  75% on rechallenge January 04, 2010 > 75% February 03, 2010 > 90% after coaching May 11, 2010   Vital Signs:  Patient profile:   70 year old female Height:      65 inches Weight:      207 pounds Pulse rate:   94 / minute BP sitting:   135 / 76  (left arm) Cuff size:   large  Vitals Entered By:  Lubertha Basque, CNA (November 09, 2010 9:48 AM)  Physical Exam  General:  The patient was alert and oriented in no acute distress. HEENT Normal.  Neck veins were flat, carotids were brisk.  Lungs were clear.  Heart sounds were regular without murmurs or gallops.  Abdomen was soft with active bowel sounds. There is no clubbing cyanosis or edema. Skin Warm and dry she walks with a cane   EKG  Procedure date:  11/09/2010  Findings:      sus rhythm at 89 Intervals 0.16/2008/23 7 Occasional PVC Otherwise normal  Impression & Recommendations:  Problem # 1:  CHEST PAIN (ICD-786.50) she has chest pain with typical and atypical features and multiple cardiac risk factors. We have discussed diagnostic strategies and have agreed to pursue a dobutamine Myoview. She is adamant in the event that she needs hospitalization that she would like to not go to [facility].  Problem # 2:   ATRIAL FLUTTER (ICD-427.32) no clinical recurrences; her Pradaxa has been discontinued  Problem # 3:  HYPERTENSION (ICD-401.9) stable on current meds Her updated medication list for this problem includes:    Diovan Hct 160-25 Mg Tabs (Valsartan-hydrochlorothiazide) .Marland Kitchen... 1 once daily  Problem # 4:  HYPERLIPIDEMIA (ICD-272.4) her simvastatin dose has been reduced to 20. Her updated medication list for this problem includes:    Simvastatin 80 Mg Tabs (Simvastatin) .Marland Kitchen... 1/2 tab daily  Other Orders: EKG w/ Interpretation (93000)  Patient Instructions: 1)  Your physician recommends that you continue on your current medications as directed. Please refer to the Current Medication list given to you today. 2)  Your physician has requested that you have a dobutamine myoview.  For further information please visit [email or web address].  Please follow instruction sheet, as given.

## 2011-01-16 LAB — CBC
HCT: 37.9 % (ref 36.0–46.0)
Hemoglobin: 11.6 g/dL — ABNORMAL LOW (ref 12.0–15.0)
MCH: 29.2 pg (ref 26.0–34.0)
MCH: 29.6 pg (ref 26.0–34.0)
MCH: 29.7 pg (ref 26.0–34.0)
MCHC: 30.9 g/dL (ref 30.0–36.0)
MCHC: 30.9 g/dL (ref 30.0–36.0)
MCHC: 31.3 g/dL (ref 30.0–36.0)
MCHC: 31.7 g/dL (ref 30.0–36.0)
MCHC: 32.2 g/dL (ref 30.0–36.0)
MCV: 92 fL (ref 78.0–100.0)
MCV: 93.5 fL (ref 78.0–100.0)
MCV: 93.8 fL (ref 78.0–100.0)
Platelets: 262 10*3/uL (ref 150–400)
Platelets: 265 10*3/uL (ref 150–400)
Platelets: 288 10*3/uL (ref 150–400)
Platelets: 320 10*3/uL (ref 150–400)
Platelets: 336 10*3/uL (ref 150–400)
RBC: 4.18 MIL/uL (ref 3.87–5.11)
RDW: 14.1 % (ref 11.5–15.5)
RDW: 14.2 % (ref 11.5–15.5)
RDW: 14.2 % (ref 11.5–15.5)
RDW: 14.5 % (ref 11.5–15.5)
RDW: 14.6 % (ref 11.5–15.5)
WBC: 10.2 10*3/uL (ref 4.0–10.5)
WBC: 5.6 10*3/uL (ref 4.0–10.5)
WBC: 8.4 10*3/uL (ref 4.0–10.5)

## 2011-01-16 LAB — GLUCOSE, CAPILLARY
Glucose-Capillary: 127 mg/dL — ABNORMAL HIGH (ref 70–99)
Glucose-Capillary: 163 mg/dL — ABNORMAL HIGH (ref 70–99)
Glucose-Capillary: 170 mg/dL — ABNORMAL HIGH (ref 70–99)
Glucose-Capillary: 171 mg/dL — ABNORMAL HIGH (ref 70–99)
Glucose-Capillary: 172 mg/dL — ABNORMAL HIGH (ref 70–99)
Glucose-Capillary: 178 mg/dL — ABNORMAL HIGH (ref 70–99)
Glucose-Capillary: 182 mg/dL — ABNORMAL HIGH (ref 70–99)
Glucose-Capillary: 184 mg/dL — ABNORMAL HIGH (ref 70–99)
Glucose-Capillary: 190 mg/dL — ABNORMAL HIGH (ref 70–99)
Glucose-Capillary: 194 mg/dL — ABNORMAL HIGH (ref 70–99)
Glucose-Capillary: 219 mg/dL — ABNORMAL HIGH (ref 70–99)
Glucose-Capillary: 224 mg/dL — ABNORMAL HIGH (ref 70–99)
Glucose-Capillary: 226 mg/dL — ABNORMAL HIGH (ref 70–99)
Glucose-Capillary: 230 mg/dL — ABNORMAL HIGH (ref 70–99)
Glucose-Capillary: 276 mg/dL — ABNORMAL HIGH (ref 70–99)
Glucose-Capillary: 93 mg/dL (ref 70–99)

## 2011-01-16 LAB — COMPREHENSIVE METABOLIC PANEL
AST: 18 U/L (ref 0–37)
Albumin: 3.8 g/dL (ref 3.5–5.2)
BUN: 16 mg/dL (ref 6–23)
Calcium: 9.1 mg/dL (ref 8.4–10.5)
Chloride: 103 mEq/L (ref 96–112)
Creatinine, Ser: 0.76 mg/dL (ref 0.4–1.2)
GFR calc Af Amer: >60 mL/min (ref >60)
Total Bilirubin: 0.5 mg/dL (ref 0.3–1.2)
Total Protein: 6.1 g/dL (ref 6.0–8.3)

## 2011-01-16 LAB — BASIC METABOLIC PANEL
BUN: 11 mg/dL (ref 6–23)
BUN: 12 mg/dL (ref 6–23)
BUN: 15 mg/dL (ref 6–23)
Calcium: 8.1 mg/dL — ABNORMAL LOW (ref 8.4–10.5)
Calcium: 8.9 mg/dL (ref 8.4–10.5)
Calcium: 9 mg/dL (ref 8.4–10.5)
Chloride: 106 mEq/L (ref 96–112)
Creatinine, Ser: 0.69 mg/dL (ref 0.4–1.2)
Creatinine, Ser: 0.75 mg/dL (ref 0.4–1.2)
Creatinine, Ser: 0.77 mg/dL (ref 0.4–1.2)
GFR calc Af Amer: >60 mL/min (ref >60)
GFR calc non Af Amer: >60 mL/min (ref >60)
GFR calc non Af Amer: >60 mL/min (ref >60)
GFR calc non Af Amer: >60 mL/min (ref >60)
Glucose, Bld: 159 mg/dL — ABNORMAL HIGH (ref 70–99)
Glucose, Bld: 247 mg/dL — ABNORMAL HIGH (ref 70–99)
Sodium: 140 mEq/L (ref 135–145)

## 2011-01-16 LAB — DIFFERENTIAL
Basophils Absolute: 0 10*3/uL (ref 0.0–0.1)
Lymphocytes Relative: 14 % (ref 12–46)
Lymphs Abs: 1.3 10*3/uL (ref 0.7–4.0)
Monocytes Absolute: 0.9 10*3/uL (ref 0.1–1.0)
Neutro Abs: 6.6 10*3/uL (ref 1.7–7.7)

## 2011-01-16 LAB — URINALYSIS, ROUTINE W REFLEX MICROSCOPIC
Glucose, UA: NEGATIVE mg/dL
Ketones, ur: NEGATIVE mg/dL
Nitrite: POSITIVE — AB
Specific Gravity, Urine: 1.016 (ref 1.005–1.030)
pH: 6 (ref 5.0–8.0)

## 2011-01-16 LAB — TSH: TSH: 2.993 u[IU]/mL (ref 0.350–4.500)

## 2011-01-16 LAB — CARDIAC PANEL(CRET KIN+CKTOT+MB+TROPI)
CK, MB: 1.9 ng/mL (ref 0.3–4.0)
Total CK: 55 U/L (ref 7–177)

## 2011-01-16 LAB — URINE MICROSCOPIC-ADD ON

## 2011-01-16 LAB — POCT CARDIAC MARKERS
CKMB, poc: <1 ng/mL — ABNORMAL LOW (ref 1.0–8.0)
Troponin i, poc: <0.05 ng/mL (ref 0.00–0.09)

## 2011-01-16 LAB — HEPARIN LEVEL (UNFRACTIONATED): Heparin Unfractionated: 0.11 IU/mL — ABNORMAL LOW (ref 0.30–0.70)

## 2011-02-11 LAB — POCT I-STAT, CHEM 8
BUN: 16 mg/dL (ref 6–23)
Calcium, Ion: 1.04 mmol/L — ABNORMAL LOW (ref 1.12–1.32)
Chloride: 100 mEq/L (ref 96–112)
Glucose, Bld: 124 mg/dL — ABNORMAL HIGH (ref 70–99)
HCT: 43 % (ref 36.0–46.0)
Potassium: 4.6 mEq/L (ref 3.5–5.1)

## 2011-02-15 LAB — URINALYSIS, ROUTINE W REFLEX MICROSCOPIC
Bilirubin Urine: NEGATIVE
Glucose, UA: NEGATIVE mg/dL
Nitrite: NEGATIVE
Specific Gravity, Urine: 1.018 (ref 1.005–1.030)
pH: 7 (ref 5.0–8.0)

## 2011-02-15 LAB — BASIC METABOLIC PANEL
BUN: 12 mg/dL (ref 6–23)
CO2: 28 mEq/L (ref 19–32)
Chloride: 104 mEq/L (ref 96–112)
GFR calc Af Amer: >60 mL/min (ref >60)
Potassium: 4.5 mEq/L (ref 3.5–5.1)

## 2011-02-15 LAB — GLUCOSE, CAPILLARY
Glucose-Capillary: 159 mg/dL — ABNORMAL HIGH (ref 70–99)
Glucose-Capillary: 173 mg/dL — ABNORMAL HIGH (ref 70–99)

## 2011-02-15 LAB — DIFFERENTIAL
Basophils Relative: 1 % (ref 0–1)
Eosinophils Absolute: 0.2 10*3/uL (ref 0.0–0.7)
Eosinophils Relative: 2 % (ref 0–5)
Lymphs Abs: 1.4 10*3/uL (ref 0.7–4.0)
Monocytes Absolute: 0.6 10*3/uL (ref 0.1–1.0)
Monocytes Relative: 7 % (ref 3–12)

## 2011-02-15 LAB — CBC
HCT: 40.4 % (ref 36.0–46.0)
MCHC: 32.5 g/dL (ref 30.0–36.0)
MCV: 93.1 fL (ref 78.0–100.0)
RBC: 4.34 MIL/uL (ref 3.87–5.11)
WBC: 8.1 10*3/uL (ref 4.0–10.5)

## 2011-02-15 LAB — URINE MICROSCOPIC-ADD ON

## 2011-03-19 ENCOUNTER — Telehealth: Payer: Self-pay | Admitting: Internal Medicine

## 2011-03-19 NOTE — Telephone Encounter (Signed)
Dont know what this would be;; normal lv funciton and neg myoview.  Januvia is new-- be glad to see or follwoup with her PCP

## 2011-03-19 NOTE — Telephone Encounter (Signed)
(  continued from previous note) She does not have pain every day and does not relate this to any specific activity. She also states that she feels sore on the inside around her heart. She thinks that her symptoms started after she started Januvia. I explained I will review this with Dr. Caryl Comes and call her back. She is agreeable.

## 2011-03-19 NOTE — Telephone Encounter (Signed)
I called and spoke with the pt and made her aware of Dr. Olin Pia recommendations. She is agreeable with contacting her PCP and we will follow her if they feel it is necessary.

## 2011-03-19 NOTE — Telephone Encounter (Signed)
Per pt calling today . C/O ankle swelling.

## 2011-03-19 NOTE — Telephone Encounter (Signed)
I called and spoke with the pt. She states she has had bilateral ankle edema for 2 weeks. She has noticed no increased SOB. She does notice some occasional sharp pains in heart

## 2011-03-20 NOTE — Op Note (Signed)
NAME:  Lydia Myers, Lydia Myers NO.:  [id number]   MEDICAL RECORD NO.:  [id number]          PATIENT TYPE:  AMB   LOCATION:  DAY                          FACILITY:  [facility]   PHYSICIAN:  Earnstine Regal, MD      DATE OF BIRTH:  April 21, 1941   DATE OF PROCEDURE:  01/21/2009  DATE OF DISCHARGE:                               OPERATIVE REPORT   PREOPERATIVE DIAGNOSIS:  Primary hyperparathyroidism.   POSTOPERATIVE DIAGNOSIS:  Primary hyperparathyroidism.   PROCEDURE:  Left inferior parathyroidectomy.   SURGEON:  Earnstine Regal, M.D., FACS   ASSISTANT:  Adin Hector, M.D.   ANESTHESIA:  General.   ESTIMATED BLOOD LOSS:  Minimal.   PREPARATION:  Betadine.   COMPLICATIONS:  None.   INDICATIONS:  The patient is a 70 year old black female well-known to  our surgical practice.  She was noted by Dr. Kelton Pillar to have an  elevated serum calcium level.  This has ranged from 11.5-12.4.  An  intact PTH level was checked and was mildly elevated at 76.  The patient  has not had any history of complications, including no history of  nephrolithiasis and no history of osteoporosis.  At my request, the  patient underwent a sestamibi scan.  This was performed on December 02, 2008 and showed evidence of a left inferior parathyroid adenoma.  The  patient now comes to surgery for minimally invasive resection.   BODY OF REPORT:  Procedure is done in OR #11 at the [facility].  The patient was brought to the operating room,  placed in supine position on the operating room table.  Following  administration of general anesthesia, the patient was prepped and draped  in the usual strict aseptic fashion.  After ascertaining that an  adequate level of anesthesia had been achieved, an inferior left neck  incision was made with a #15 blade.  Dissection was carried through  subcutaneous tissues.  Platysma was divided with the electrocautery.  Skin flaps were elevated  circumferentially and a Weitlaner retractor was  placed for exposure.  Strap muscles are incised in the midline and the  left thyroid lobe is exposed.  It was retracted with a Art therapist.  Dissection in this tracheoesophageal groove reveals an enlarged  parathyroid gland lying adjacent to the left side of the esophagus.  Care was taken to avoid the recurrent nerve, which is present in the  tracheoesophageal groove.  The parathyroid gland is gently dissected  out.  Vascular tributaries were divided between small and medium  Ligaclips.  The gland is completely resected.  It measures approximately  2.5 cm in greatest dimension.  It is submitted fresh to pathology where  Dr. Susanne Greenhouse performed a frozen section biopsy.  This confirmed  parathyroid tissue consistent with parathyroid adenoma.   Good hemostasis is noted in the wound.  Surgicel was placed in the  operative field.  Strap muscles were reapproximated in the midline with  interrupted 3-0 Vicryl sutures.  Platysma was closed with interrupted 3-  0 Vicryl sutures.  The skin was closed with  a running 4-0 Monocryl  subcuticular suture.  Wound is washed and dried and benzoin and Steri-  Strips are applied.  Sterile dressings are applied.  The patient is  awakened from anesthesia and brought to the recovery room in stable  condition.  The patient tolerated the procedure well.      Earnstine Regal, MD  Electronically Signed     TMG/MEDQ  D:  01/21/2009  T:  01/21/2009  Job:  [id number]   cc:   Milford Cage. Laurann Montana, M.D.  Fax: [phone]

## 2011-03-20 NOTE — Op Note (Signed)
NAME:  Lydia Myers, Lydia Myers NO.:  [id number]   MEDICAL RECORD NO.:  [id number]          PATIENT TYPE:  INP   LOCATION:  5015                         FACILITY:  [facility]   PHYSICIAN:  Audree Camel. Noemi Chapel, M.D. DATE OF BIRTH:  10-21-1941   DATE OF PROCEDURE:  06/21/2008  DATE OF DISCHARGE:                               OPERATIVE REPORT   PREOPERATIVE DIAGNOSIS:  Right hip degenerative joint disease.   POSTOPERATIVE DIAGNOSIS:  Right hip degenerative joint disease.   PROCEDURE:  Right total hip replacement using DePuy S-ROM Press-Fit  total hip system with acetabular component 52-mm Press-Fit ASR  acetabular cup with femoral component 20 x 15 femoral stem with 36, +0  femoral neck with 46-mm ASR metal head with 41F small ZTT sleeve.   SURGEON:  Audree Camel. Noemi Chapel, MD   ASSISTANT:  Matthew Saras, PA   ANESTHESIA:  General.   OPERATIVE TIME:  1 hour 20 minutes.   ESTIMATED BLOOD LOSS:  300 mL.   COMPLICATIONS:  None.   DESCRIPTION OF PROCEDURE:  Ms. Tilles was brought to the operating room  on June 21, 2008, placed on operative table in supine position.  She  received Ancef 2 g IV preoperatively for prophylaxis.  After being  placed under general anesthesia, she had a Foley catheter placed under  sterile conditions.  Her right hip was examined.  She had flexion to 90,  extension to 0, internal and external rotation of 10 degrees, and  minimal leg length discrepancy.  She was then turned in the right  lateral decubitus position and secured on the bed with a Mark frame.  Her right hip and leg was then prepped using sterile DuraPrep and draped  using sterile technique.  Originally through a 15-cm posterolateral  greater trochanteric incision, initial exposure was made.  The  underlying subcutaneous tissues were incised.  The iliotibial band and  gluteus maximus fascia was incised as well revealing the underlying  sciatic nerve, which was carefully protected.  Short  external rotators  of the hip and hip capsule were released off the femoral neck insertion  and tagged, and then the hip was posteriorly dislocated.  Found to have  grade 3 and 4 DJD in the femoral head.  A femoral neck cut was then made  1.5 to 2 cm above the lesser trochanter and the femoral head was  removed.  Carefully retractors were placed around the acetabulum.  Found  to have grade 3 and 4 degenerative changes noted in the acetabulum.  Degenerative acetabular labrum was removed.  Sequential acetabular  reaming was then carried out up to a 51 mm size in the appropriate  manner of anteversion, abduction, and inclination, and then a 52-mm  acetabular trial was hammered into position with an excellent fit.  It  was then removed and the actual 52-mm ASR acetabular shell was hammered  into position in the appropriate manner of anteversion, abduction, and  inclination with an excellent Press-Fit.  At this point, the proximal  femur was exposed.  Sequential reaming of the femoral canal was carried  out to a  15.5 mm size.  Proximal calcar reaming carried out to a 11F  small.  The 11F small ZTT sleeve trial was placed followed by the 20 x  15 femoral stem with a 36, +0 femoral head, and then a 46 mm, +0 trial  head, and then the hip was reduced.  It was taken through a full range  of motion.  There was some slight laxity, and thus the femoral neck was  lengthened to a +6 size, and this gave excellent stability.  An  excellent range of motion and excellent restoration of normal leg  lengths.  At this point, the femoral trials were removed.  The femoral  canal was irrigated with saline and then the actual 11F small ZTT sleeve  was hammered into position with an excellent fit, and then the 20 x 15  femoral stem with a 36, +6 femoral neck length was hammered into  position in the appropriate manner of anteversion, and then a 46-mm ASR  head was hammered on to the femoral neck with an excellent  Morse taper  fit.  The hip was then reduced, taken through full range of motion, and  found to be stable up to 80 degrees of internal rotation in both neutral  and 30 degrees of adduction and also stable in the abduction and  external rotation, and leg lengths were equal.  At this point, it was  felt that all the components were of excellent size, fit, and stability.  The wound was further irrigated with saline, and then the short external  rotators of the hip and hip capsule were reattached to their femoral  neck insertion through 2 drill holes in the greater trochanter.  The  iliotibial band and gluteus maximus fascia was closed with #1 Ethibond  suture.  Subcutaneous tissue was closed with 0 and 2-0 Vicryl.  Subcuticular layer was closed with 4-0 Monocryl.  Sterile dressings were  applied.  The patient then turned supine, checked for leg lengths that  were equal, rotation equal, pulses 2+ and symmetric.  She was then  awakened, extubated, and taken to recovery room in stable condition.  Needle and sponge counts were correct x2 at the end of the case.      Robert A. Noemi Chapel, M.D.  Electronically Signed     RAW/MEDQ  D:  06/21/2008  T:  06/22/2008  Job:  [id number]

## 2011-03-20 NOTE — Discharge Summary (Signed)
NAME:  Lydia Myers, Lydia Myers NO.:  [id number]   MEDICAL RECORD NO.:  [id number]          PATIENT TYPE:  INP   LOCATION:  5015                         FACILITY:  [facility]   PHYSICIAN:  Audree Camel. Noemi Chapel, M.D. DATE OF BIRTH:  Jun 08, 1941   DATE OF ADMISSION:  06/21/2008  DATE OF DISCHARGE:                               DISCHARGE SUMMARY   ADMITTING DIAGNOSES:  1. End-stage degenerative joint disease, right hip.  2. Hypercalcemia.  3. Chronic obstructive pulmonary disease.  4. Diabetes.  5. Hypertension.   DISCHARGE DIAGNOSES:  1. End-stage degenerative joint disease, right hip status post total      hip replacement.  2. Hyperkaliemia.  3. Chronic obstructive pulmonary disease.  4. Diabetes.  5. Hypertension.  6. Postoperative blood loss anemia.  7. Hyponatremia.  8. Leukocytosis.  9. Fever.  10.Atelectasis.   HISTORY OF PRESENT ILLNESS:  This patient is a 70 year old black female  with a history of end-stage DJD of her right hip.  She has failed  conservative care including intra-articular cortisone injections and  anti-inflammatories.  Risks, benefits, and possible complications of her  right total hip were discussed with the patient.  She understands these.  She is status post left total hip in 2001 by Dr. Wynelle Link.  She has a  long-standing history of hypercalcemia since she was in her 5s.  No  treatment has been sought for this and she has not had any complications  from this.  She has been medically cleared for surgery by Dr. Kelton Pillar and by Dr. Brand Males of [facility].  The risks, benefits, and possible complications have been  discussed in detail with her and her family.  They understand these and  are without question.   PROCEDURES IN-HOUSE:  On June 21, 2008, the patient underwent a right  total hip press-fit posterior approach total hip replacement by Dr.  Noemi Chapel.   HOSPITAL COURSE:  Postoperatively, she  was admitted for pain control,  DVT prophylaxis, and physical therapy.  Postop day 1, she had a white  cell count of 12.6, hemoglobin was 11.3, and had a small amount of  drainage from her hip.  She was given some milk of magnesia.  Her  dressing was changed.  Her PCA was discontinued.  Postop day 2, she had  spiked fevers of temperature 101.6 with a white cell count of 20.9  thousand.  Her O2 sats were between 92% and 93%.  When she was spiking  her fevers, her INR was 1.7.  Surgical wound was well-approximated.  Postop day 3, T-max of 101.1.  Chest x-ray showed atelectasis.  UA  showed a small amount of bacteria.  Blood cultures so far are negative.  She was given a Dulcolax suppository.  She was able to finally have a  bowel movement.  Postop day 4, she has been afebrile for 24 hours.  She  progressed well with physical therapy with both safety and ambulation as  well as posterior total hip precautions.  Surgical wound is well-  approximated and healing with no drainage,  no redness, and minimal  swelling.  She has a 2+ dorsalis pedis pulse.  EHL and FHL were intact.  She is ambulating 50% weightbearing with supervision.  She is on a  diabetic diet.  She is being discharged to home in stable condition.   DISCHARGE MEDICATIONS:  1. Percocet 1-2 q.4-6 h. p.r.n. pain.  2. Robaxin 500 mg 1 q.6 h. p.r.n. muscle spasm.  3. Coumadin 5 mg 1 tablet once a day to be adjusted by home health      pharmacy.   She will have her labs repeated on Monday by home health.  She will need  a CBC with diff and a BMET.  We will see her in the office on July 05, 2008, for wound check and x-rays.  She has been instructed to call with  increased redness, increased pain, increased swelling, increased  drainage, or a temp greater than 101, and she is being discharged to  home in stable condition.      Kirstin Shepperson, P.A.      Robert A. Noemi Chapel, M.D.  Electronically Signed    KS/MEDQ  D:   06/25/2008  T:  06/25/2008  Job:  [id number]

## 2011-03-23 NOTE — Op Note (Signed)
NAME:  TIMOTHEA, GAMBA NO.:  [id number]   MEDICAL RECORD NO.:  [id number]          PATIENT TYPE:  AMB   LOCATION:  ENDO                         FACILITY:  [facility]   PHYSICIAN:  Earle Gell, M.D.   DATE OF BIRTH:  Feb 21, 1941   DATE OF PROCEDURE:  08/14/2004  DATE OF DISCHARGE:                                 OPERATIVE REPORT   INDICATIONS:  Ms. Angelamarie Csizmadia Locher is a 70 year old female born 25-Sep-1941. Ms. Ream is scheduled to undergo her first screening colonoscopy  with polypectomy to prevent colon cancer.   ENDOSCOPIST:  Earle Gell, M.D.   PREMEDICATION:  Versed 6 mg, Demerol 60 mg.   PROCEDURE NOTE:  After obtaining informed consent, Ms. Mormino was placed in  the left lateral decubitus position. I administered intravenous Demerol and  intravenous Versed to achieve conscious sedation for the procedure. The  patient's blood pressure, oxygen saturation, and cardiac rhythm were  monitored throughout the procedure and documented in the medical record.   Anal inspection and digital rectal exam were normal.  The Olympus adjustable  pediatric colonoscopy was introduced into the rectum and advanced to the  cecum. Colonic preparation for exam today was excellent.   Rectum:  Normal.   Sigmoid colon and descending colon:  Normal.   Splenic flexure:  Normal.   Transverse colon:  Normal.   Hepatic flexure:  Normal.   Ascending colon:  Normal.   Cecum and ileocecal valve:  Normal.   ASSESSMENT:  Normal screening proctocolonoscopy to the cecum.      MJ/MEDQ  D:  08/14/2004  T:  08/14/2004  Job:  [id number]   cc:   Milford Cage. Laurann Montana, M.D.  [place] [place]  [place] [address]  Fax: [phone]

## 2011-03-23 NOTE — Op Note (Signed)
NAMEXEE, GOARD NO.:  [id number]   MEDICAL RECORD NO.:  [id number]          PATIENT TYPE:  AMB   LOCATION:  [place]                          FACILITY:  [facility]   PHYSICIAN:  Youlanda Mighty. Sypher, M.D. DATE OF BIRTH:  10-11-1941   DATE OF PROCEDURE:  12/04/2005  DATE OF DISCHARGE:                                 OPERATIVE REPORT   PREOP DIAGNOSIS:  Chronic stenosing tenosynovitis, right index finger.   POSTOP DIAGNOSIS:  Chronic stenosing tenosynovitis, right index finger.   OPERATION:  Release of right index finger A1 pulley.   OPERATING SURGEON:  Theodis Sato.   ASSISTANT:  Leverne Humbles PA-C.   ANESTHESIA:  General sedation/0.25% Marcaine and 2% lidocaine metacarpal  head level block of right index finger, supervising anesthesiologist is Dr.  Orene Desanctis.   INDICATIONS:  Lydia Myers is a 16-year woman referred for evaluation and  management of a locking right index finger. Clinical examination revealed  signs of chronic stenosing tenosynovitis with swelling of her A1 pulley,  tenderness on palpation of the A1 pulley and active locking of the finger.   Lydia Myers has type 2 diabetes.   Due to failure to respond to nonoperative measures, she is brought to the  operating room at this time for release of her right index finger A1 pulley.  She had prior release of the left trigger finger in 1986 with a very  satisfactory long term result.   PROCEDURE:  Avri Graeber was brought to the operating room and placed in  supine position on the operating table.   Following light sedation the right arm was prepped with Betadine soap  solution, sterilely draped. A pneumatic tourniquet was applied proximal  brachium.   Following exsanguination limb with Esmarch bandage an arterial tourniquet  proximal brachium inflated to 220 mmHg. Procedure commenced with a short  oblique incision over the palpably thickened A1 pulley. The subcutaneous  tissues were carefully  divided taking care to identify the pulley. All soft  tissues were cleared. The pulley was released with a scalpel and scissors.  The tendons delivered and found to be swollen but otherwise unremarkable.   Full active range of motion of the index finger was recovered.   Lydia Myers tolerated surgery and anesthesia well.   Her wound was repaired with mattress sutures of 5-0 nylon followed by  application of a compressive dressing of Xeroflo, sterile gauze and Ace  wrap.   Lydia Myers will return to the office for follow-up in 1 week for dressing  change suture removal and advancement to excise program.   She was transferred to recovery room with stable vital signs.      Youlanda Mighty Sypher, M.D.  Electronically Signed     RVS/MEDQ  D:  12/04/2005  T:  12/04/2005  Job:  [id number]

## 2011-07-31 LAB — BLOOD GAS, ARTERIAL
Bicarbonate: 26.6 — ABNORMAL HIGH
O2 Saturation: 93.3
TCO2: 27.9
pO2, Arterial: 67.6 — ABNORMAL LOW

## 2011-08-03 LAB — COMPREHENSIVE METABOLIC PANEL
ALT: 16
Alkaline Phosphatase: 83
CO2: 28
Calcium: 12.3 — ABNORMAL HIGH
GFR calc non Af Amer: >60
Glucose, Bld: 73
Potassium: 4.2
Sodium: 140

## 2011-08-03 LAB — CBC
Hemoglobin: 13.5
MCHC: 32.4
RBC: 4.5

## 2011-08-03 LAB — DIFFERENTIAL
Basophils Absolute: 0
Basophils Relative: 0
Eosinophils Absolute: 0.1
Neutrophils Relative %: 77

## 2011-08-03 LAB — URINALYSIS, ROUTINE W REFLEX MICROSCOPIC
Bilirubin Urine: NEGATIVE
Hgb urine dipstick: NEGATIVE
Protein, ur: NEGATIVE
Urobilinogen, UA: 0.2

## 2011-08-03 LAB — URINE MICROSCOPIC-ADD ON

## 2011-08-03 LAB — PROTIME-INR
INR: 1
Prothrombin Time: 12.9

## 2011-11-27 ENCOUNTER — Emergency Department (INDEPENDENT_AMBULATORY_CARE_PROVIDER_SITE_OTHER)
Admission: EM | Admit: 2011-11-27 | Discharge: 2011-11-27 | Disposition: A | Discharge status: Home / Self Care | Payer: Medicare Other | Source: Home / Self Care | Attending: Family Medicine | Admitting: Family Medicine

## 2011-11-27 ENCOUNTER — Emergency Department (INDEPENDENT_AMBULATORY_CARE_PROVIDER_SITE_OTHER): Payer: Medicare Other

## 2011-11-27 ENCOUNTER — Encounter (HOSPITAL_COMMUNITY): Payer: Self-pay | Admitting: Emergency Medicine

## 2011-11-27 DIAGNOSIS — M25552 Pain in left hip: Secondary | ICD-10-CM

## 2011-11-27 DIAGNOSIS — M25559 Pain in unspecified hip: Secondary | ICD-10-CM

## 2011-11-27 HISTORY — DX: Essential (primary) hypertension: I10

## 2011-11-27 HISTORY — DX: Unspecified osteoarthritis, unspecified site: M19.90

## 2011-11-27 HISTORY — DX: Pure hypercholesterolemia, unspecified: E78.00

## 2011-11-27 NOTE — ED Provider Notes (Addendum)
History     CSN: [id number]  Arrival date & time 11/27/11  [id number]   First MD Initiated Contact with Patient 11/27/11 1007      Chief Complaint  Patient presents with  . Hip Pain    (Consider location/radiation/quality/duration/timing/severity/associated sxs/prior treatment) Patient is a 71 y.o. female presenting with hip pain. The history is provided by the patient.  Hip Pain This is a chronic problem. The current episode started more than 1 week ago (5 weeks ago). The problem occurs constantly. The problem has been gradually worsening. Pertinent negatives include no abdominal pain. Associated symptoms comments: No injury.. The symptoms are aggravated by walking, bending and standing.    Past Medical History  Diagnosis Date  . Hypertension   . Diabetes mellitus   . High cholesterol   . Arthritis     Past Surgical History  Procedure Date  . Hip surgery   . Joint replacement     No family history on file.  History  Substance Use Topics  . Smoking status: Never Smoker   . Smokeless tobacco: Not on file  . Alcohol Use: No    OB History    Grav Para Term Preterm Abortions TAB SAB Ect Mult Living                  Review of Systems  Constitutional: Negative.   Gastrointestinal: Negative.  Negative for abdominal pain.  Genitourinary: Negative.   Musculoskeletal: Positive for gait problem.  Neurological: Negative.     Allergies  Review of patient's allergies indicates no known allergies.  Home Medications   Current Outpatient Rx  Name Route Sig Dispense Refill  . ASPIRIN 81 MG PO TABS Oral Take 160 mg by mouth daily.    . IBUPROFEN 200 MG PO TABS Oral Take 200 mg by mouth every 6 (six) hours as needed.    Marland Kitchen METFORMIN HCL PO Oral Take by mouth 2 (two) times daily.    Marland Kitchen SIMVASTATIN 10 MG PO TABS Oral Take 10 mg by mouth at bedtime.    Marland Kitchen JANUVIA PO Oral Take by mouth.    . DIOVAN PO Oral Take by mouth.      BP 139/81  Pulse 92  Temp(Src) 98.6 F (37 C)  (Oral)  Resp 18  SpO2 94%  Physical Exam  Nursing note and vitals reviewed. Constitutional: She is oriented to person, place, and time. She appears well-developed and well-nourished.  Abdominal: Soft. Bowel sounds are normal. She exhibits no mass. There is no tenderness. There is no rebound.  Musculoskeletal: She exhibits tenderness.       Left hip: She exhibits decreased range of motion, decreased strength and tenderness.       Legs:      Pain and hesitancy getting to standing and with walking , esp over surgical scar area.  Neurological: She is alert and oriented to person, place, and time.    ED Course  Procedures (including critical care time)  Labs Reviewed - No data to display Dg Hip Complete Left  11/27/2011  *RADIOLOGY REPORT*  Clinical Data: Status post total hip arthroplasty.  Pain and left hip.  LEFT HIP - COMPLETE 2+ VIEW  Comparison: None  Findings: The patient is status post bilateral total hip arthroplasty.  The right hip is located.  No fracture or dislocation identified.  No periarticular lucency identified.  There is heterotopic bone formation adjacent to the greater trochanter of the left hip. The left hip is located and there  is no evidence for periarticular fracture or subluxation.  No periarticular lucency identified.  IMPRESSION:  1.  No complicating features identified status post bilateral total hip arthroplasty.  Original Report Authenticated By: Angelita Ingles, M.D.     1. Hip pain, left       MDM  X-rays reviewed and report per radiologist.  Discussed with dr Maureen Ralphs, will see now in office.        Pauline Good, MD 11/27/11 [place], MD 11/27/11 1321

## 2011-11-27 NOTE — ED Notes (Signed)
Family at bedside. 

## 2011-11-27 NOTE — ED Notes (Signed)
Patient complains of left hip pain onset 5 weeks ago.  Initially had pain in left thigh, pain moved up into hip.  Intermittent for 2-3 weeks.  This past weekend pain increased significantly.  Pain in top of hip, pelvis, pain radiates around waist to the back, only traveling to spine.  With getting up from sitting position, walking, turning over in bed causes significant pain.

## 2017-03-19 ENCOUNTER — Ambulatory Visit (INDEPENDENT_AMBULATORY_CARE_PROVIDER_SITE_OTHER): Payer: Medicare Other | Admitting: Sports Medicine

## 2017-03-19 ENCOUNTER — Ambulatory Visit (INDEPENDENT_AMBULATORY_CARE_PROVIDER_SITE_OTHER): Payer: Medicare Other

## 2017-03-19 DIAGNOSIS — L84 Corns and callosities: Secondary | ICD-10-CM | POA: Diagnosis not present

## 2017-03-19 DIAGNOSIS — M2041 Other hammer toe(s) (acquired), right foot: Secondary | ICD-10-CM

## 2017-03-19 DIAGNOSIS — M79674 Pain in right toe(s): Secondary | ICD-10-CM | POA: Diagnosis not present

## 2017-03-19 DIAGNOSIS — M79675 Pain in left toe(s): Secondary | ICD-10-CM

## 2017-03-19 DIAGNOSIS — M21619 Bunion of unspecified foot: Secondary | ICD-10-CM | POA: Diagnosis not present

## 2017-03-19 DIAGNOSIS — M2042 Other hammer toe(s) (acquired), left foot: Secondary | ICD-10-CM

## 2017-03-19 DIAGNOSIS — E119 Type 2 diabetes mellitus without complications: Secondary | ICD-10-CM | POA: Diagnosis not present

## 2017-03-19 DIAGNOSIS — B351 Tinea unguium: Secondary | ICD-10-CM

## 2017-03-19 NOTE — Progress Notes (Signed)
Subjective: Lydia Myers is a 76 y.o. female patient with history of diabetes who presents to office today complaining of long, painful nails  while ambulating in shoes; unable to trim at right 2nd toe and reports that her left 2nd toe hurts after she wore a new pair of shoes. Patient states that the glucose reading this morning was not recorded. Patient denies any new changes in medication or new problems. Patient denies any new cramping, numbness, burning or tingling in the legs.  Patient Active Problem List   Diagnosis Date Noted  . ATRIAL FLUTTER 11/09/2010  . CHEST PAIN 11/09/2010  . CARDIOMEGALY 11/08/2010  . ASTHMA, UNSPECIFIED 11/10/2009  . RESPIRATORY FAILURE, CHRONIC 11/10/2009  . WEIGHT GAIN, ABNORMAL 11/10/2009  . HYPERLIPIDEMIA 03/01/2008  . HYPERTENSION 03/01/2008  . ALLERGIC RHINITIS 03/01/2008  . SHORTNESS OF BREATH (SOB) 03/01/2008   Current Outpatient Prescriptions on File Prior to Visit  Medication Sig Dispense Refill  . aspirin 81 MG tablet Take 160 mg by mouth daily.    Marland Kitchen ibuprofen (ADVIL,MOTRIN) 200 MG tablet Take 200 mg by mouth every 6 (six) hours as needed.    Marland Kitchen METFORMIN HCL PO Take by mouth 2 (two) times daily.    . simvastatin (ZOCOR) 10 MG tablet Take 10 mg by mouth at bedtime.    . SitaGLIPtin Phosphate (JANUVIA PO) Take by mouth.    . Valsartan (DIOVAN PO) Take by mouth.     No current facility-administered medications on file prior to visit.    No Known Allergies  No results found for this or any previous visit (from the past 2160 hour(s)).  Objective: General: Patient is awake, alert, and oriented x 3 and in no acute distress.  Integument: Skin is warm, dry and supple bilateral. Nails are tender, long, thickened and  dystrophic with subungual debris, consistent with onychomycosis, bilateral but the right 2nd toenail was the only nail that needed to be trimmed this visit. No signs of infection. + soft corn dorsal IPJ Left 2nd hammertoe.  Remaining integument unremarkable.  Vasculature:  Dorsalis Pedis pulse 1/4 bilateral. Posterior Tibial pulse 1 /4 bilateral.  Capillary fill time <3 sec 1-5 bilateral. Positive hair growth to the level of the digits. Temperature gradient within normal limits. No varicosities present bilateral. No edema present bilateral.   Neurology: The patient has intact sensation measured with a 5.07/10g Semmes Weinstein Monofilament at all pedal sites bilateral . Vibratory sensation intact bilateral with tuning fork. No Babinski sign present bilateral.   Musculoskeletal: Bunion and hammertoe especially left 2nd toe, symptomatic pedal deformities noted bilateral. Muscular strength 5/5 in all lower extremity muscular groups bilateral without pain on range of motion. No tenderness with calf compression bilateral.  Assessment and Plan: Problem List Items Addressed This Visit    None    Visit Diagnoses    Dermatophytosis of nail    -  Primary   Toe pain, bilateral       Relevant Orders   DG Foot 2 Views Left   Hammer toes of both feet       Corn of toe       Bunion       Diabetes mellitus without complication ([place])          -Examined patient. -Discussed and educated patient on diabetic foot care, especially with  regards to the vascular, neurological and musculoskeletal systems.  -Stressed the importance of good glycemic control and the detriment of not  controlling glucose levels in relation to the  foot. -Mechanically debrided right 2nd toenail using sterile nail nipper and filed with dremel without incident  -Dispensed toe cushions  -Advised wider and more spacious shoes  -Answered all patient questions -Patient to return  in 3 months for at risk foot care -Patient advised to call the office if any problems or questions arise in the meantime.  Landis Martins, DPM

## 2017-06-18 ENCOUNTER — Ambulatory Visit: Payer: Medicare Other | Admitting: Sports Medicine

## 2017-07-01 ENCOUNTER — Emergency Department (HOSPITAL_COMMUNITY): Payer: Medicare Other

## 2017-07-01 ENCOUNTER — Encounter (HOSPITAL_COMMUNITY): Payer: Self-pay | Admitting: *Deleted

## 2017-07-01 ENCOUNTER — Inpatient Hospital Stay (HOSPITAL_COMMUNITY): Payer: Medicare Other

## 2017-07-01 ENCOUNTER — Inpatient Hospital Stay (HOSPITAL_COMMUNITY)
Admission: EM | Admit: 2017-07-01 | Discharge: 2017-07-05 | DRG: 308 | Disposition: A | Discharge status: Home Health | Payer: Medicare Other | Attending: Internal Medicine | Admitting: Internal Medicine

## 2017-07-01 DIAGNOSIS — I4891 Unspecified atrial fibrillation: Secondary | ICD-10-CM | POA: Diagnosis present

## 2017-07-01 DIAGNOSIS — Z7984 Long term (current) use of oral hypoglycemic drugs: Secondary | ICD-10-CM

## 2017-07-01 DIAGNOSIS — E119 Type 2 diabetes mellitus without complications: Secondary | ICD-10-CM

## 2017-07-01 DIAGNOSIS — I4892 Unspecified atrial flutter: Secondary | ICD-10-CM | POA: Diagnosis not present

## 2017-07-01 DIAGNOSIS — E876 Hypokalemia: Secondary | ICD-10-CM | POA: Diagnosis present

## 2017-07-01 DIAGNOSIS — I10 Essential (primary) hypertension: Secondary | ICD-10-CM | POA: Diagnosis not present

## 2017-07-01 DIAGNOSIS — I5033 Acute on chronic diastolic (congestive) heart failure: Secondary | ICD-10-CM | POA: Diagnosis present

## 2017-07-01 DIAGNOSIS — Z5320 Procedure and treatment not carried out because of patient's decision for unspecified reasons: Secondary | ICD-10-CM | POA: Diagnosis present

## 2017-07-01 DIAGNOSIS — Z91013 Allergy to seafood: Secondary | ICD-10-CM

## 2017-07-01 DIAGNOSIS — E784 Other hyperlipidemia: Secondary | ICD-10-CM | POA: Diagnosis not present

## 2017-07-01 DIAGNOSIS — J449 Chronic obstructive pulmonary disease, unspecified: Secondary | ICD-10-CM | POA: Diagnosis present

## 2017-07-01 DIAGNOSIS — M79609 Pain in unspecified limb: Secondary | ICD-10-CM | POA: Diagnosis not present

## 2017-07-01 DIAGNOSIS — B961 Klebsiella pneumoniae [K. pneumoniae] as the cause of diseases classified elsewhere: Secondary | ICD-10-CM | POA: Diagnosis present

## 2017-07-01 DIAGNOSIS — Z96653 Presence of artificial knee joint, bilateral: Secondary | ICD-10-CM | POA: Diagnosis present

## 2017-07-01 DIAGNOSIS — I272 Pulmonary hypertension, unspecified: Secondary | ICD-10-CM | POA: Diagnosis present

## 2017-07-01 DIAGNOSIS — R791 Abnormal coagulation profile: Secondary | ICD-10-CM | POA: Diagnosis present

## 2017-07-01 DIAGNOSIS — I110 Hypertensive heart disease with heart failure: Secondary | ICD-10-CM | POA: Diagnosis present

## 2017-07-01 DIAGNOSIS — E0590 Thyrotoxicosis, unspecified without thyrotoxic crisis or storm: Secondary | ICD-10-CM | POA: Diagnosis not present

## 2017-07-01 DIAGNOSIS — A498 Other bacterial infections of unspecified site: Secondary | ICD-10-CM

## 2017-07-01 DIAGNOSIS — H409 Unspecified glaucoma: Secondary | ICD-10-CM | POA: Diagnosis present

## 2017-07-01 DIAGNOSIS — Z8249 Family history of ischemic heart disease and other diseases of the circulatory system: Secondary | ICD-10-CM | POA: Diagnosis not present

## 2017-07-01 DIAGNOSIS — Z90710 Acquired absence of both cervix and uterus: Secondary | ICD-10-CM

## 2017-07-01 DIAGNOSIS — I483 Typical atrial flutter: Secondary | ICD-10-CM | POA: Diagnosis present

## 2017-07-01 DIAGNOSIS — I340 Nonrheumatic mitral (valve) insufficiency: Secondary | ICD-10-CM | POA: Diagnosis not present

## 2017-07-01 DIAGNOSIS — Z79899 Other long term (current) drug therapy: Secondary | ICD-10-CM

## 2017-07-01 DIAGNOSIS — E785 Hyperlipidemia, unspecified: Secondary | ICD-10-CM | POA: Diagnosis present

## 2017-07-01 DIAGNOSIS — Z7982 Long term (current) use of aspirin: Secondary | ICD-10-CM

## 2017-07-01 DIAGNOSIS — I501 Left ventricular failure: Secondary | ICD-10-CM

## 2017-07-01 DIAGNOSIS — N390 Urinary tract infection, site not specified: Secondary | ICD-10-CM | POA: Diagnosis present

## 2017-07-01 DIAGNOSIS — Z833 Family history of diabetes mellitus: Secondary | ICD-10-CM

## 2017-07-01 DIAGNOSIS — I471 Supraventricular tachycardia: Secondary | ICD-10-CM | POA: Diagnosis present

## 2017-07-01 DIAGNOSIS — I5031 Acute diastolic (congestive) heart failure: Secondary | ICD-10-CM | POA: Diagnosis not present

## 2017-07-01 DIAGNOSIS — R42 Dizziness and giddiness: Secondary | ICD-10-CM | POA: Diagnosis not present

## 2017-07-01 DIAGNOSIS — I5032 Chronic diastolic (congestive) heart failure: Secondary | ICD-10-CM | POA: Diagnosis not present

## 2017-07-01 DIAGNOSIS — E039 Hypothyroidism, unspecified: Secondary | ICD-10-CM | POA: Diagnosis present

## 2017-07-01 DIAGNOSIS — R7989 Other specified abnormal findings of blood chemistry: Secondary | ICD-10-CM

## 2017-07-01 DIAGNOSIS — Z66 Do not resuscitate: Secondary | ICD-10-CM | POA: Diagnosis present

## 2017-07-01 DIAGNOSIS — E118 Type 2 diabetes mellitus with unspecified complications: Secondary | ICD-10-CM | POA: Diagnosis present

## 2017-07-01 DIAGNOSIS — R319 Hematuria, unspecified: Secondary | ICD-10-CM | POA: Diagnosis present

## 2017-07-01 DIAGNOSIS — E7800 Pure hypercholesterolemia, unspecified: Secondary | ICD-10-CM | POA: Diagnosis present

## 2017-07-01 HISTORY — DX: Unspecified glaucoma: H40.9

## 2017-07-01 HISTORY — DX: Chronic obstructive pulmonary disease, unspecified: J44.9

## 2017-07-01 HISTORY — DX: Unspecified asthma, uncomplicated: J45.909

## 2017-07-01 LAB — PROTIME-INR
INR: 1.16
Prothrombin Time: 14.9 seconds (ref 11.4–15.2)

## 2017-07-01 LAB — DIFFERENTIAL
Basophils Absolute: 0 10*3/uL (ref 0.0–0.1)
Basophils Relative: 0 %
Eosinophils Absolute: 0.1 10*3/uL (ref 0.0–0.7)
Eosinophils Relative: 1 %
Lymphocytes Relative: 12 %
Lymphs Abs: 1.2 10*3/uL (ref 0.7–4.0)
Monocytes Absolute: 1 10*3/uL (ref 0.1–1.0)
Monocytes Relative: 10 %
Neutro Abs: 7.4 10*3/uL (ref 1.7–7.7)
Neutrophils Relative %: 77 %

## 2017-07-01 LAB — URINALYSIS, ROUTINE W REFLEX MICROSCOPIC
Bilirubin Urine: NEGATIVE
Glucose, UA: NEGATIVE mg/dL
KETONES UR: NEGATIVE mg/dL
NITRITE: POSITIVE — AB
PROTEIN: 100 mg/dL — AB
Specific Gravity, Urine: 1.015 (ref 1.005–1.030)
pH: 6 (ref 5.0–8.0)

## 2017-07-01 LAB — I-STAT CHEM 8, ED
BUN: 19 mg/dL (ref 6–20)
CHLORIDE: 104 mmol/L (ref 101–111)
CREATININE: 0.9 mg/dL (ref 0.44–1.00)
Calcium, Ion: 1.15 mmol/L (ref 1.15–1.40)
GLUCOSE: 118 mg/dL — AB (ref 65–99)
HCT: 39 % (ref 36.0–46.0)
Hemoglobin: 13.3 g/dL (ref 12.0–15.0)
POTASSIUM: 3.9 mmol/L (ref 3.5–5.1)
Sodium: 143 mmol/L (ref 135–145)
TCO2: 23 mmol/L (ref 22–32)

## 2017-07-01 LAB — CBC
HCT: 37.7 % (ref 36.0–46.0)
Hemoglobin: 12.3 g/dL (ref 12.0–15.0)
MCH: 29.2 pg (ref 26.0–34.0)
MCHC: 32.6 g/dL (ref 30.0–36.0)
MCV: 89.5 fL (ref 78.0–100.0)
Platelets: 327 10*3/uL (ref 150–400)
RBC: 4.21 MIL/uL (ref 3.87–5.11)
RDW: 14.8 % (ref 11.5–15.5)
WBC: 9.7 10*3/uL (ref 4.0–10.5)

## 2017-07-01 LAB — COMPREHENSIVE METABOLIC PANEL
ALT: 54 U/L (ref 14–54)
AST: 31 U/L (ref 15–41)
Albumin: 4.2 g/dL (ref 3.5–5.0)
Alkaline Phosphatase: 82 U/L (ref 38–126)
Anion gap: 9 (ref 5–15)
BUN: 19 mg/dL (ref 6–20)
CHLORIDE: 107 mmol/L (ref 101–111)
CO2: 25 mmol/L (ref 22–32)
Calcium: 9.3 mg/dL (ref 8.9–10.3)
Creatinine, Ser: 1.04 mg/dL — ABNORMAL HIGH (ref 0.44–1.00)
GFR, EST AFRICAN AMERICAN: 58 mL/min — AB (ref >60)
GFR, EST NON AFRICAN AMERICAN: 50 mL/min — AB (ref >60)
Glucose, Bld: 122 mg/dL — ABNORMAL HIGH (ref 65–99)
POTASSIUM: 4 mmol/L (ref 3.5–5.1)
SODIUM: 141 mmol/L (ref 135–145)
Total Bilirubin: 0.7 mg/dL (ref 0.3–1.2)
Total Protein: 7.4 g/dL (ref 6.5–8.1)

## 2017-07-01 LAB — APTT: aPTT: 30 seconds (ref 24–36)

## 2017-07-01 LAB — D-DIMER, QUANTITATIVE (NOT AT ARMC): D DIMER QUANT: 0.65 ug{FEU}/mL — AB (ref 0.00–0.50)

## 2017-07-01 LAB — I-STAT TROPONIN, ED: TROPONIN I, POC: 0.02 ng/mL (ref 0.00–0.08)

## 2017-07-01 LAB — URINALYSIS, MICROSCOPIC (REFLEX): RBC / HPF: NONE SEEN RBC/hpf (ref 0–5)

## 2017-07-01 MED ORDER — ADENOSINE 6 MG/2ML IV SOLN
12.0000 mg | Freq: Once | INTRAVENOUS | Status: AC
  Administered 2017-07-01: 12 mg via INTRAVENOUS
  Filled 2017-07-01: qty 4

## 2017-07-01 MED ORDER — VALSARTAN-HYDROCHLOROTHIAZIDE 320-25 MG PO TABS: 1.0000 | ORAL_TABLET | Freq: Every day | ORAL | Status: DC

## 2017-07-01 MED ORDER — LATANOPROST 0.005 % OP SOLN
1.0000 [drp] | Freq: Every day | OPHTHALMIC | Status: DC
  Administered 2017-07-02 – 2017-07-04 (×3): 1 [drp] via OPHTHALMIC
  Filled 2017-07-01 (×2): qty 2.5

## 2017-07-01 MED ORDER — ENOXAPARIN SODIUM 40 MG/0.4ML ~~LOC~~ SOLN: 40.0000 mg | SUBCUTANEOUS | Status: DC

## 2017-07-01 MED ORDER — ONDANSETRON HCL 4 MG/2ML IJ SOLN
4.0000 mg | Freq: Four times a day (QID) | INTRAMUSCULAR | Status: DC | PRN
  Administered 2017-07-01: 4 mg via INTRAVENOUS
  Filled 2017-07-01: qty 2

## 2017-07-01 MED ORDER — ADENOSINE 6 MG/2ML IV SOLN
INTRAVENOUS | Status: AC
  Administered 2017-07-01: 6 mg
  Filled 2017-07-01: qty 2

## 2017-07-01 MED ORDER — AMIODARONE LOAD VIA INFUSION
150.0000 mg | Freq: Once | INTRAVENOUS | Status: AC
  Administered 2017-07-01: 150 mg via INTRAVENOUS
  Filled 2017-07-01: qty 83.34

## 2017-07-01 MED ORDER — RIVAROXABAN 20 MG PO TABS
20.0000 mg | ORAL_TABLET | Freq: Every day | ORAL | Status: DC
  Administered 2017-07-01 – 2017-07-05 (×5): 20 mg via ORAL
  Filled 2017-07-01 (×5): qty 1

## 2017-07-01 MED ORDER — DEXTROSE 5 % IV SOLN
1.0000 g | INTRAVENOUS | Status: DC
  Administered 2017-07-02 – 2017-07-05 (×4): 1 g via INTRAVENOUS
  Filled 2017-07-01 (×4): qty 10

## 2017-07-01 MED ORDER — AMIODARONE HCL IN DEXTROSE 360-4.14 MG/200ML-% IV SOLN
60.0000 mg/h | INTRAVENOUS | Status: AC
  Administered 2017-07-01 (×2): 60 mg/h via INTRAVENOUS
  Filled 2017-07-01: qty 200

## 2017-07-01 MED ORDER — INSULIN ASPART 100 UNIT/ML ~~LOC~~ SOLN
0.0000 [IU] | Freq: Three times a day (TID) | SUBCUTANEOUS | Status: DC
  Administered 2017-07-02 (×3): 1 [IU] via SUBCUTANEOUS
  Administered 2017-07-03: 9 [IU] via SUBCUTANEOUS
  Administered 2017-07-03: 2 [IU] via SUBCUTANEOUS
  Administered 2017-07-03 – 2017-07-04 (×2): 1 [IU] via SUBCUTANEOUS
  Administered 2017-07-04: 3 [IU] via SUBCUTANEOUS
  Administered 2017-07-04: 2 [IU] via SUBCUTANEOUS
  Administered 2017-07-05: 1 [IU] via SUBCUTANEOUS

## 2017-07-01 MED ORDER — DEXTROSE 5 % IV SOLN
1.0000 g | INTRAVENOUS | Status: DC
  Filled 2017-07-01: qty 10

## 2017-07-01 MED ORDER — BRINZOLAMIDE 1 % OP SUSP
1.0000 [drp] | Freq: Two times a day (BID) | OPHTHALMIC | Status: DC
  Administered 2017-07-02 – 2017-07-05 (×7): 1 [drp] via OPHTHALMIC
  Filled 2017-07-01 (×2): qty 10

## 2017-07-01 MED ORDER — MORPHINE SULFATE (PF) 2 MG/ML IV SOLN
2.0000 mg | Freq: Once | INTRAVENOUS | Status: AC
  Administered 2017-07-01: 2 mg via INTRAVENOUS
  Filled 2017-07-01: qty 1

## 2017-07-01 MED ORDER — SIMVASTATIN 20 MG PO TABS
20.0000 mg | ORAL_TABLET | Freq: Every day | ORAL | Status: DC
  Administered 2017-07-02 – 2017-07-03 (×2): 20 mg via ORAL
  Filled 2017-07-01 (×2): qty 1

## 2017-07-01 MED ORDER — IOPAMIDOL (ISOVUE-370) INJECTION 76%: 100.0000 mL | Freq: Once | INTRAVENOUS | Status: DC | PRN

## 2017-07-01 MED ORDER — FENTANYL CITRATE (PF) 100 MCG/2ML IJ SOLN
100.0000 ug | Freq: Once | INTRAMUSCULAR | Status: DC
  Filled 2017-07-01: qty 2

## 2017-07-01 MED ORDER — DILTIAZEM HCL 100 MG IV SOLR
5.0000 mg/h | INTRAVENOUS | Status: DC
  Filled 2017-07-01: qty 100

## 2017-07-01 MED ORDER — IOPAMIDOL (ISOVUE-370) INJECTION 76%
INTRAVENOUS | Status: AC
  Filled 2017-07-01: qty 100

## 2017-07-01 MED ORDER — AMIODARONE HCL IN DEXTROSE 360-4.14 MG/200ML-% IV SOLN
30.0000 mg/h | INTRAVENOUS | Status: DC
  Administered 2017-07-02 (×3): 30 mg/h via INTRAVENOUS
  Filled 2017-07-01 (×3): qty 200

## 2017-07-01 MED ORDER — DILTIAZEM HCL-DEXTROSE 100-5 MG/100ML-% IV SOLN (PREMIX)
5.0000 mg/h | Freq: Once | INTRAVENOUS | Status: AC
  Administered 2017-07-01: 5 mg/h via INTRAVENOUS
  Administered 2017-07-01: 10 mg/h via INTRAVENOUS
  Administered 2017-07-01: 5 mg/h via INTRAVENOUS
  Filled 2017-07-01: qty 100

## 2017-07-01 MED ORDER — SODIUM CHLORIDE 0.9 % IV BOLUS (SEPSIS)
1000.0000 mL | Freq: Once | INTRAVENOUS | Status: AC
  Administered 2017-07-01: 1000 mL via INTRAVENOUS

## 2017-07-01 MED ORDER — FUROSEMIDE 10 MG/ML IJ SOLN
40.0000 mg | Freq: Once | INTRAMUSCULAR | Status: AC
  Administered 2017-07-01: 40 mg via INTRAVENOUS
  Filled 2017-07-01: qty 4

## 2017-07-01 MED ORDER — IRBESARTAN 300 MG PO TABS
300.0000 mg | ORAL_TABLET | Freq: Every day | ORAL | Status: DC
  Administered 2017-07-02 – 2017-07-05 (×4): 300 mg via ORAL
  Filled 2017-07-01 (×2): qty 2
  Filled 2017-07-01 (×2): qty 1

## 2017-07-01 MED ORDER — ADENOSINE 6 MG/2ML IV SOLN
INTRAVENOUS | Status: AC
  Filled 2017-07-01: qty 2

## 2017-07-01 MED ORDER — WHITE PETROLATUM GEL
Status: DC | PRN
  Administered 2017-07-01: via TOPICAL
  Filled 2017-07-01: qty 1

## 2017-07-01 MED ORDER — FLECAINIDE ACETATE 100 MG PO TABS
300.0000 mg | ORAL_TABLET | Freq: Once | ORAL | Status: AC
  Administered 2017-07-01: 300 mg via ORAL
  Filled 2017-07-01: qty 3

## 2017-07-01 MED ORDER — MIDAZOLAM HCL 2 MG/2ML IJ SOLN
INTRAMUSCULAR | Status: AC
  Filled 2017-07-01: qty 6

## 2017-07-01 MED ORDER — ADENOSINE 6 MG/2ML IV SOLN
6.0000 mg | Freq: Once | INTRAVENOUS | Status: AC
  Administered 2017-07-01: 6 mg via INTRAVENOUS
  Filled 2017-07-01: qty 2

## 2017-07-01 MED ORDER — DILTIAZEM HCL 25 MG/5ML IV SOLN
10.0000 mg | Freq: Once | INTRAVENOUS | Status: AC
  Administered 2017-07-01: 10 mg via INTRAVENOUS
  Filled 2017-07-01: qty 5

## 2017-07-01 MED ORDER — FLECAINIDE ACETATE 50 MG PO TABS: 75.0000 mg | ORAL_TABLET | Freq: Two times a day (BID) | ORAL | Status: DC

## 2017-07-01 MED ORDER — DILTIAZEM HCL-DEXTROSE 100-5 MG/100ML-% IV SOLN (PREMIX)
INTRAVENOUS | Status: AC
  Filled 2017-07-01: qty 100

## 2017-07-01 MED ORDER — MIDAZOLAM HCL 2 MG/2ML IJ SOLN: 3.0000 mg | Freq: Once | INTRAMUSCULAR | Status: DC

## 2017-07-01 MED ORDER — DEXTROSE 5 % IV SOLN
1.0000 g | Freq: Once | INTRAVENOUS | Status: AC
  Administered 2017-07-01: 1 g via INTRAVENOUS
  Filled 2017-07-01: qty 10

## 2017-07-01 MED ORDER — HYDROCHLOROTHIAZIDE 25 MG PO TABS
25.0000 mg | ORAL_TABLET | Freq: Every day | ORAL | Status: DC
  Administered 2017-07-02 – 2017-07-05 (×4): 25 mg via ORAL
  Filled 2017-07-01 (×4): qty 1

## 2017-07-01 NOTE — Progress Notes (Signed)
ANTICOAGULATION CONSULT NOTE - Initial Consult  Pharmacy Consult for xarelto Indication: atrial fibrillation  Allergies  Allergen Reactions  . Shrimp [Shellfish Allergy] Nausea And Vomiting    Patient Measurements:   Heparin Dosing Weight:   Vital Signs: Temp: 98.3 F (36.8 C) (08/27 2029) Temp Source: Oral (08/27 2029) BP: 122/79 (08/27 2000) Pulse Rate: 151 (08/27 2109)  Labs:  Recent Labs  07/01/17 1318 07/01/17 1320  HGB 13.3 12.3  HCT 39.0 37.7  PLT  --  327  APTT  --  30  LABPROT  --  14.9  INR  --  1.16  CREATININE 0.90 1.04*    CrCl cannot be calculated (Unknown ideal weight.).   Medical History: Past Medical History:  Diagnosis Date  . Arthritis   . Asthma   . COPD (chronic obstructive pulmonary disease) ([place])   . Diabetes mellitus   . Glaucoma   . High cholesterol   . Hypertension     Medications:  Scheduled:  . brinzolamide  1 drop Both Eyes BID  . fentaNYL (SUBLIMAZE) injection  100 mcg Intravenous Once  . [START ON 07/02/2017] insulin aspart  0-9 Units Subcutaneous TID WC  . iopamidol      . latanoprost  1 drop Both Eyes QHS  . midazolam  3 mg Intravenous Once  . rivaroxaban  20 mg Oral Q supper  . [START ON 07/02/2017] simvastatin  20 mg Oral Daily  . [START ON 07/02/2017] valsartan-hydrochlorothiazide  1 tablet Oral Daily   Infusions:  . amiodarone 60 mg/hr (07/01/17 2118)   Followed by  . [START ON 07/02/2017] amiodarone    . cefTRIAXone (ROCEPHIN)  IV    . diltiazem (CARDIZEM) infusion      Assessment: 76 yo female with afib will be started on Xarelto.  SCr today is 1.04.  Goal of Therapy:   Monitor platelets by anticoagulation protocol: Yes   Plan:  - Xarelto 20 mg po daily  - monitor CBC  Ethne Jeon, Tsz-Yin 07/01/2017,10:30 PM

## 2017-07-01 NOTE — ED Notes (Signed)
ED Provider at bedside. 

## 2017-07-01 NOTE — ED Notes (Signed)
Patient transported to MRI 

## 2017-07-01 NOTE — H&P (Signed)
History and Physical    Lydia Myers:[id number] DOB: 09-Mar-1941 DOA: 07/01/2017  PCP: Kelton Pillar, MD  Patient coming from: Home  Chief Complaint: Dizziness  HPI: Lydia Myers is a 76 y.o. female with medical history significant of A Flutter in the past s/p ablation in 2011 with Dr. Caryl Comes, HTN, DM, HLD; she presents with two-week history of dizziness. She states that the room spins around her, causing her to sit down. She has not passed out from this. She was diagnosed with vertigo by her primary care physician and was prescribed meclizine. Despite taking medication, she continued to be very dizzy. Today, she also noted heart palpitations like "the heart was trying to feed the brain." She denies any fevers or chills, no chest pain or pressure, denies any shortness of breath. No previous nausea or vomiting, no abdominal pain. She does also admit to dysuria. No swelling of her extremities.   ED Course: Patient was found to be in SVT, was given 2 doses of adenosine. This revealed underlying A. Flutter. She was started on Cardizem bolus and drip. Cardiology was consulted.  Review of Systems: As per HPI otherwise 10 point review of systems negative.   Past Medical History:  Diagnosis Date  . Arthritis   . Asthma   . COPD (chronic obstructive pulmonary disease) ([place])   . Diabetes mellitus   . Glaucoma   . High cholesterol   . Hypertension     Past Surgical History:  Procedure Laterality Date  . ABDOMINAL HYSTERECTOMY    . HIP SURGERY    . JOINT REPLACEMENT    . left lumpectomy    . REPLACEMENT TOTAL KNEE BILATERAL     2009-Right, 2001-Left  . Right bog toe fusion    . Right trigger finger surgery       reports that she has never smoked. She has never used smokeless tobacco. She reports that she does not drink alcohol or use drugs.  Allergies  Allergen Reactions  . Shrimp [Shellfish Allergy] Nausea And Vomiting    History reviewed. No pertinent family  history.   Prior to Admission medications   Medication Sig Start Date End Date Taking? Authorizing Provider  aspirin 81 MG tablet Take 160 mg by mouth daily.   Yes [provider]  AZOPT 1 % ophthalmic suspension Place 1 drop into both eyes 2 (two) times daily. 05/01/17  Yes [provider]  clotrimazole-betamethasone (LOTRISONE) cream Apply 1 application topically daily as needed for irritation. Apply 1 application every day as needed for vaginal irritation 05/23/17  Yes [provider]  JANUVIA 100 MG tablet Take 100 mg by mouth daily. 05/01/17  Yes [provider]  meclizine (ANTIVERT) 12.5 MG tablet Take 12.5-25 mg by mouth 3 (three) times daily as needed for dizziness. 06/20/17  Yes [provider]  metFORMIN (GLUCOPHAGE) 850 MG tablet Take 850-1,700 mg by mouth 2 (two) times daily. Take 850mg  by mouth in the morning, and take 1700mg  by mouth in the evening 06/12/17  Yes [provider]  simvastatin (ZOCOR) 20 MG tablet Take 20 mg by mouth daily. 04/30/17  Yes [provider]  TRAVATAN Z 0.004 % SOLN ophthalmic solution Place 1 drop into both eyes every evening. 05/01/17  Yes [provider]  valsartan-hydrochlorothiazide (DIOVAN-HCT) 320-25 MG tablet Take 1 tablet by mouth daily. 04/30/17  Yes [provider]    Physical Exam: Vitals:   07/01/17 1330 07/01/17 1400 07/01/17 1403 07/01/17 1404  BP: 140/82  120/80  (!) 142/90  Pulse: (!) 209 (!) 208  87  Resp: (!) 21 (!) 23  20  Temp:   98.3 F (36.8 C)   TempSrc:      SpO2: 100% 98%  98%    Constitutional: NAD, calm, comfortable Eyes: PERRL, lids and conjunctivae normal ENMT: Mucous membranes are moist. Posterior pharynx clear of any exudate or lesions.Normal dentition.  Neck: normal, supple, no masses, no thyromegaly Respiratory: clear to auscultation bilaterally, no wheezing, no crackles. Normal respiratory effort. No accessory muscle use.  Cardiovascular:  Irregular rhythm, rate 80s, no murmurs / rubs / gallops. No extremity edema Abdomen: no tenderness, no masses palpated. No hepatosplenomegaly. Bowel sounds positive.  Musculoskeletal: no clubbing / cyanosis. No joint deformity upper and lower extremities. Good ROM, no contractures. Normal muscle tone.  Skin: no rashes, lesions, ulcers. No induration Neurologic: CN 2-12 grossly intact. Strength 5/5 in all 4.  Psychiatric: Normal judgment and insight. Alert and oriented x 3. Normal mood.   Labs on Admission: I have personally reviewed following labs and imaging studies  CBC:  Recent Labs Lab 07/01/17 1318 07/01/17 1320  WBC  --  9.7  NEUTROABS  --  7.4  HGB 13.3 12.3  HCT 39.0 37.7  MCV  --  89.5  PLT  --  976   Basic Metabolic Panel:  Recent Labs Lab 07/01/17 1318 07/01/17 1320  NA 143 141  K 3.9 4.0  CL 104 107  CO2  --  25  GLUCOSE 118* 122*  BUN 19 19  CREATININE 0.90 1.04*  CALCIUM  --  9.3   GFR: CrCl cannot be calculated (Unknown ideal weight.). Liver Function Tests:  Recent Labs Lab 07/01/17 1320  AST 31  ALT 54  ALKPHOS 82  BILITOT 0.7  PROT 7.4  ALBUMIN 4.2   No results for input(s): LIPASE, AMYLASE in the last 168 hours. No results for input(s): AMMONIA in the last 168 hours. Coagulation Profile:  Recent Labs Lab 07/01/17 1320  INR 1.16   Cardiac Enzymes: No results for input(s): CKTOTAL, CKMB, CKMBINDEX, TROPONINI in the last 168 hours. BNP (last 3 results) No results for input(s): PROBNP in the last 8760 hours. HbA1C: No results for input(s): HGBA1C in the last 72 hours. CBG: No results for input(s): GLUCAP in the last 168 hours. Lipid Profile: No results for input(s): CHOL, HDL, LDLCALC, TRIG, CHOLHDL, LDLDIRECT in the last 72 hours. Thyroid Function Tests: No results for input(s): TSH, T4TOTAL, FREET4, T3FREE, THYROIDAB in the last 72 hours. Anemia Panel: No results for input(s): VITAMINB12, FOLATE, FERRITIN, TIBC, IRON, RETICCTPCT  in the last 72 hours. Urine analysis:    Component Value Date/Time   COLORURINE YELLOW 07/01/2017 1259   APPEARANCEUR HAZY (A) 07/01/2017 1259   LABSPEC 1.015 07/01/2017 1259   PHURINE 6.0 07/01/2017 1259   GLUCOSEU NEGATIVE 07/01/2017 1259   HGBUR SMALL (A) 07/01/2017 1259   BILIRUBINUR NEGATIVE 07/01/2017 1259   KETONESUR NEGATIVE 07/01/2017 1259   PROTEINUR 100 (A) 07/01/2017 1259   UROBILINOGEN 1.0 09/30/2010 1812   NITRITE POSITIVE (A) 07/01/2017 1259   LEUKOCYTESUR MODERATE (A) 07/01/2017 1259   Sepsis Labs: !!!!!!!!!!!!!!!!!!!!!!!!!!!!!!!!!!!!!!!!!!!! @LABRCNTIP (procalcitonin:4,lacticidven:4) )No results found for this or any previous visit (from the past 240 hour(s)).   Radiological Exams on Admission: Dg Chest Port 1 View  Result Date: 07/01/2017 CLINICAL DATA:  Shortness of breath.  Atrial fibrillation. EXAM: PORTABLE CHEST 1 VIEW COMPARISON:  Portable chest dated 09/29/2010 and chest CT dated 11/09/2010. FINDINGS: Improved inspiration. No  gross change in enlargement of the cardiac silhouette. Small amount of linear scarring or atelectasis at the left lung base. Otherwise, clear lungs. Diffuse osteopenia. Mild scoliosis. IMPRESSION: No acute abnormality.  Stable mild cardiomegaly. Electronically Signed   By: Claudie Revering M.D.   On: 07/01/2017 13:55    EKG: Independently reviewed. Initial EKG with normal sinus rhythm with ventricular bigeminy, second EKG reviewed with SVT rate 218, third EKG revealed underlying atrial flutter.   Assessment/Plan Active Problems:   Atrial fibrillation with rapid ventricular response (HCC)   SVT and underlying A Flutter/Fib -Hx of A Flutter s/p ablation by Dr. Caryl Comes in 2011 (report under media tab)  -CHADSVASc > 5. Anticoagulant choice per cardiology  -Cardiology consulted -Received adenosine 6mg , 12mg , and started on cardizem bolus/drip  -Echocardiogram   Dizziness  -Rule out stroke  -MRI brain pending   Elevated d dimer -CTA  chest to rule out PE   UTI POA  -Rocephin  -Urine culture pending  HTN -Continue valsartan, HCTZ   DM type 2  -Hold home oral meds -Check Ha1c  -SSI  HLD -Continue zocor  -Check lipid panel    DVT prophylaxis: lovenox  Code Status: DNR, confirmed with patient  Family Communication: No family at bedside Disposition Plan: Pending further workup, return home in stable Consults called: Cardiology  Admission status: inpatient, step down unit    Severity of Illness: The appropriate patient status for this patient is INPATIENT. Inpatient status is judged to be reasonable and necessary in order to provide the required intensity of service to ensure the patient's safety. The patient's presenting symptoms, physical exam findings, and initial radiographic and laboratory data in the context of their chronic comorbidities is felt to place them at high risk for further clinical deterioration. Furthermore, it is not anticipated that the patient will be medically stable for discharge from the hospital within 2 midnights of admission. The following factors support the patient status of inpatient.   " The patient's presenting symptoms include dizziness, palpitations. " The worrisome physical exam findings include irregular rhythm. " The initial radiographic and laboratory data are worrisome because of SVT and underlying a fib/flutter, required adenosine and now cardizem gtt " The chronic co-morbidities include diabetes, HLD, HTN.   * I certify that at the point of admission it is my clinical judgment that the patient will require inpatient hospital care spanning beyond 2 midnights from the point of admission due to high intensity of service, high risk for further deterioration and high frequency of surveillance required.*   Dessa Phi, DO [facility] [email or web address] Password [email or web address] 07/01/2017, 4:07 PM

## 2017-07-01 NOTE — ED Notes (Addendum)
BED ASSIGNMENT CHANGE. HOSPITALIST WITH PT AND FAMILY. AWARE OF TRANSFER TO [place]. HOSPITALIST REQUESTED TO CALL DAUGHTER TO UPDATE

## 2017-07-01 NOTE — ED Notes (Signed)
EKG given to Dr. Yao.  

## 2017-07-01 NOTE — ED Notes (Signed)
ED TO INPATIENT HANDOFF REPORT  Name/Age/Gender Lydia Myers 76 y.o. female  Code Status Code Status History    This patient does not have a recorded code status. Please follow your organizational policy for patients in this situation.      Home/SNF/Other Home  Chief Complaint dizzy ( every 15 minutes ) / vertigo / nausea  Level of Care/Admitting Diagnosis ED Disposition    ED Disposition Condition Comment   Admit  Hospital Area: [facility] [[id number]]  Level of Care: Stepdown [14]  Admit to SDU based on following criteria: Cardiac Instability:  Patients experiencing chest pain, unconfirmed MI and stable, arrhythmias and CHF requiring medical management and potentially compromising patient's stability  Diagnosis: Atrial fibrillation with rapid ventricular response [facility]) [[id number]]  Admitting Physician: Dessa Phi [facility] [[id number]]  Attending Physician: Dessa Phi [facility] [[id number]]  Estimated length of stay: past midnight tomorrow  Certification:: I certify this patient will need inpatient services for at least 2 midnights  PT Class (Do Not Modify): Inpatient [101]  PT Acc Code (Do Not Modify): Private [1]       Medical History Past Medical History:  Diagnosis Date  . Arthritis   . Diabetes mellitus   . High cholesterol   . Hypertension     Allergies Allergies  Allergen Reactions  . Shrimp [Shellfish Allergy] Nausea And Vomiting    IV Location/Drains/Wounds Patient Lines/Drains/Airways Status   Active Line/Drains/Airways    Name:   Placement date:   Placement time:   Site:   Days:   Peripheral IV 07/01/17 Right Antecubital  07/01/17    1310    Antecubital    less than 1          Labs/Imaging Results for orders placed or performed during the hospital encounter of 07/01/17 (from the past 48 hour(s))  Urinalysis, Routine w reflex microscopic     Status: Abnormal   Collection Time: 07/01/17 12:59 PM  Result Value Ref Range   Color, Urine YELLOW YELLOW   APPearance HAZY (A) CLEAR   Specific Gravity, Urine 1.015 1.005 - 1.030   pH 6.0 5.0 - 8.0   Glucose, UA NEGATIVE NEGATIVE mg/dL   Hgb urine dipstick SMALL (A) NEGATIVE   Bilirubin Urine NEGATIVE NEGATIVE   Ketones, ur NEGATIVE NEGATIVE mg/dL   Protein, ur 100 (A) NEGATIVE mg/dL   Nitrite POSITIVE (A) NEGATIVE   Leukocytes, UA MODERATE (A) NEGATIVE  Urinalysis, Microscopic (reflex)     Status: Abnormal   Collection Time: 07/01/17 12:59 PM  Result Value Ref Range   RBC / HPF NONE SEEN 0 - 5 RBC/hpf   WBC, UA 6-30 0 - 5 WBC/hpf   Bacteria, UA MANY (A) NONE SEEN   Squamous Epithelial / LPF 0-5 (A) NONE SEEN   WBC Clumps PRESENT   I-stat troponin, ED (not at [facility])     Status: None   Collection Time: 07/01/17  1:17 PM  Result Value Ref Range   Troponin i, poc 0.02 0.00 - 0.08 ng/mL   Comment 3            Comment: Due to the release kinetics of cTnI, a negative result within the first hours of the onset of symptoms does not rule out myocardial infarction with certainty. If myocardial infarction is still suspected, repeat the test at appropriate intervals.   I-Stat Chem 8, ED  (not at [facility], [facility])     Status: Abnormal   Collection Time: 07/01/17  1:18 PM  Result  Value Ref Range   Sodium 143 135 - 145 mmol/L   Potassium 3.9 3.5 - 5.1 mmol/L   Chloride 104 101 - 111 mmol/L   BUN 19 6 - 20 mg/dL   Creatinine, Ser 0.90 0.44 - 1.00 mg/dL   Glucose, Bld 118 (H) 65 - 99 mg/dL   Calcium, Ion 1.15 1.15 - 1.40 mmol/L   TCO2 23 22 - 32 mmol/L   Hemoglobin 13.3 12.0 - 15.0 g/dL   HCT 39.0 36.0 - 46.0 %  Protime-INR     Status: None   Collection Time: 07/01/17  1:20 PM  Result Value Ref Range   Prothrombin Time 14.9 11.4 - 15.2 seconds   INR 1.16   APTT     Status: None   Collection Time: 07/01/17  1:20 PM  Result Value Ref Range   aPTT 30 24 - 36 seconds  CBC     Status: None   Collection Time: 07/01/17  1:20 PM  Result Value Ref Range   WBC 9.7  4.0 - 10.5 K/uL   RBC 4.21 3.87 - 5.11 MIL/uL   Hemoglobin 12.3 12.0 - 15.0 g/dL   HCT 37.7 36.0 - 46.0 %   MCV 89.5 78.0 - 100.0 fL   MCH 29.2 26.0 - 34.0 pg   MCHC 32.6 30.0 - 36.0 g/dL   RDW 14.8 11.5 - 15.5 %   Platelets 327 150 - 400 K/uL  Differential     Status: None   Collection Time: 07/01/17  1:20 PM  Result Value Ref Range   Neutrophils Relative % 77 %   Neutro Abs 7.4 1.7 - 7.7 K/uL   Lymphocytes Relative 12 %   Lymphs Abs 1.2 0.7 - 4.0 K/uL   Monocytes Relative 10 %   Monocytes Absolute 1.0 0.1 - 1.0 K/uL   Eosinophils Relative 1 %   Eosinophils Absolute 0.1 0.0 - 0.7 K/uL   Basophils Relative 0 %   Basophils Absolute 0.0 0.0 - 0.1 K/uL  Comprehensive metabolic panel     Status: Abnormal   Collection Time: 07/01/17  1:20 PM  Result Value Ref Range   Sodium 141 135 - 145 mmol/L   Potassium 4.0 3.5 - 5.1 mmol/L   Chloride 107 101 - 111 mmol/L   CO2 25 22 - 32 mmol/L   Glucose, Bld 122 (H) 65 - 99 mg/dL   BUN 19 6 - 20 mg/dL   Creatinine, Ser 1.04 (H) 0.44 - 1.00 mg/dL   Calcium 9.3 8.9 - 10.3 mg/dL   Total Protein 7.4 6.5 - 8.1 g/dL   Albumin 4.2 3.5 - 5.0 g/dL   AST 31 15 - 41 U/L   ALT 54 14 - 54 U/L   Alkaline Phosphatase 82 38 - 126 U/L   Total Bilirubin 0.7 0.3 - 1.2 mg/dL   GFR calc non Af Amer 50 (L) >60 mL/min   GFR calc Af Amer 58 (L) >60 mL/min    Comment: (NOTE) The eGFR has been calculated using the CKD EPI equation. This calculation has not been validated in all clinical situations. eGFR's persistently <60 mL/min signify possible Chronic Kidney Disease.    Anion gap 9 5 - 15  D-dimer, quantitative (not at [facility])     Status: Abnormal   Collection Time: 07/01/17  1:20 PM  Result Value Ref Range   D-Dimer, Quant 0.65 (H) 0.00 - 0.50 ug/mL-FEU    Comment: (NOTE) At the manufacturer cut-off of 0.50 ug/mL FEU, this assay has been  documented to exclude PE with a sensitivity and negative predictive value of 97 to 99%.  At this time, this assay has  not been approved by the FDA to exclude DVT/VTE. Results should be correlated with clinical presentation.    Dg Chest Port 1 View  Result Date: 07/01/2017 CLINICAL DATA:  Shortness of breath.  Atrial fibrillation. EXAM: PORTABLE CHEST 1 VIEW COMPARISON:  Portable chest dated 09/29/2010 and chest CT dated 11/09/2010. FINDINGS: Improved inspiration. No gross change in enlargement of the cardiac silhouette. Small amount of linear scarring or atelectasis at the left lung base. Otherwise, clear lungs. Diffuse osteopenia. Mild scoliosis. IMPRESSION: No acute abnormality.  Stable mild cardiomegaly. Electronically Signed   By: Claudie Revering M.D.   On: 07/01/2017 13:55    Pending Labs Unresulted Labs    Start     Ordered   07/01/17 1350  Urine culture  Add-on,   STAT     07/01/17 1349      Vitals/Pain Today's Vitals   07/01/17 1400 07/01/17 1403 07/01/17 1404 07/01/17 1446  BP: 120/80  (!) 142/90   Pulse: (!) 208  87   Resp: (!) 23  20   Temp:  98.3 F (36.8 C)    TempSrc:      SpO2: 98%  98%   PainSc:    0-No pain    Isolation Precautions No active isolations  Medications Medications  dilTIAZem HCl-Dextrose 100-5 MG/100ML-% infusion SOLN (not administered)  adenosine (ADENOCARD) 6 MG/2ML injection 6 mg (6 mg Intravenous Given 07/01/17 1318)  sodium chloride 0.9 % bolus 1,000 mL (1,000 mLs Intravenous New Bag/Given 07/01/17 1317)  adenosine (ADENOCARD) 6 MG/2ML injection 12 mg (12 mg Intravenous Given 07/01/17 1322)  diltiazem (CARDIZEM) injection 10 mg (10 mg Intravenous Given 07/01/17 1335)  diltiazem (CARDIZEM) 100 mg in dextrose 5% 159m (1 mg/mL) infusion (5 mg/hr Intravenous New Bag/Given 07/01/17 1345)  cefTRIAXone (ROCEPHIN) 1 g in dextrose 5 % 50 mL IVPB (1 g Intravenous New Bag/Given 07/01/17 1444)    Mobility walks with device

## 2017-07-01 NOTE — ED Notes (Signed)
EDP YAO STATES PT IS OKAY FOR MRI. MRI TRANSFER SPOKE WITH EDP YAO AND VERIFIED PT OKAY TO GO. MRI SUPERVISOR WILL SPEAK WITH EDP YAO TO CONFIRM IF PT CAN GO TO MRI. KRISTIN M RN WILL AWAITING TO BE PRESENT WITH PT DURING MRI

## 2017-07-01 NOTE — Consult Note (Signed)
Cardiology Consultation:   Patient ID: Lydia Myers; [id number]; Dec 10, 1940   Admit date: 07/01/2017 Date of Consult: 07/01/2017  Primary Care Provider: Kelton Pillar, MD Primary Cardiologist: Gaynelle Cage Electrophysiologist:  Caryl Comes   Patient Profile:   Lydia Myers is a 76 y.o. female with a hx of atrial flutter and vertigo who is being seen today for the evaluation of atrial flutter  at the request of Dr Darl Householder .  History of Present Illness:   Lydia Myers 76 y.o. with history of HTN, DM , elevated lipids No vascular / CAD. 2012 had flutter ablation with Dr Caryl Comes. Was on pradaxa at that time. Did well until about 2 weeks ago. Started having fairly classic vertigo with room violently spinning nausea and vomiting. Took 40 antivert over the course of a week or so. Two days after stopping vertigo recurred. Has had palpitations similar to pre ablation that started With vertigo. No other focal neurologic signs or deficits. Epic notes indicate asthma but she denies. She lives independently and was still driving before vertigo. She has a daughter in [place] and one in [place]. No contraindications to anti coagulation Does not fall  This patients CHA2DS2-VASc Score and unadjusted Ischemic Stroke Rate (% per year) is equal to 7.2 % stroke rate/year from a score of 5  Above score calculated as 1 point each if present [CHF, HTN, DM, Vascular=MI/PAD/Aortic Plaque, Age if 65-74, or Female] Above score calculated as 2 points each if present [Age > 75, or Stroke/TIA/TE]   Past Medical History:  Diagnosis Date  . Arthritis   . Diabetes mellitus   . High cholesterol   . Hypertension     Past Surgical History:  Procedure Laterality Date  . HIP SURGERY    . JOINT REPLACEMENT       Home Medications:  Prior to Admission medications   Medication Sig Start Date End Date Taking? Authorizing Provider  aspirin 81 MG tablet Take 160 mg by mouth daily.   Yes [provider]  AZOPT  1 % ophthalmic suspension Place 1 drop into both eyes 2 (two) times daily. 05/01/17  Yes [provider]  clotrimazole-betamethasone (LOTRISONE) cream Apply 1 application topically daily as needed for irritation. Apply 1 application every day as needed for vaginal irritation 05/23/17  Yes [provider]  JANUVIA 100 MG tablet Take 100 mg by mouth daily. 05/01/17  Yes [provider]  meclizine (ANTIVERT) 12.5 MG tablet Take 12.5-25 mg by mouth 3 (three) times daily as needed for dizziness. 06/20/17  Yes [provider]  metFORMIN (GLUCOPHAGE) 850 MG tablet Take 850-1,700 mg by mouth 2 (two) times daily. Take 850mg  by mouth in the morning, and take 1700mg  by mouth in the evening 06/12/17  Yes [provider]  simvastatin (ZOCOR) 20 MG tablet Take 20 mg by mouth daily. 04/30/17  Yes [provider]  TRAVATAN Z 0.004 % SOLN ophthalmic solution Place 1 drop into both eyes every evening. 05/01/17  Yes [provider]  valsartan-hydrochlorothiazide (DIOVAN-HCT) 320-25 MG tablet Take 1 tablet by mouth daily. 04/30/17  Yes [provider]    Inpatient Medications: Scheduled Meds:  Continuous Infusions: . cefTRIAXone (ROCEPHIN)  IV     PRN Meds:   Allergies:    Allergies  Allergen Reactions  . Shrimp [Shellfish Allergy] Nausea And Vomiting    Social History:   Social History   Social History  . Marital status: Single    Spouse name: N/A  . Number  of children: N/A  . Years of education: N/A   Occupational History  . Not on file.   Social History Main Topics  . Smoking status: Never Smoker  . Smokeless tobacco: Never Used  . Alcohol use No  . Drug use: No  . Sexual activity: Not on file   Other Topics Concern  . Not on file   Social History Narrative  . No narrative on file    Family History:   No family history on file. Mother with DM Father with HTN  ROS:  Please see the history of present illness.  ROS    All other ROS reviewed and negative.     Physical Exam/Data:   Vitals:   07/01/17 1330 07/01/17 1400 07/01/17 1403 07/01/17 1404  BP: 140/82 120/80  (!) 142/90  Pulse: (!) 209 (!) 208  87  Resp: (!) 21 (!) 23  20  Temp:   98.3 F (36.8 C)   TempSrc:      SpO2: 100% 98%  98%   No intake or output data in the 24 hours ending 07/01/17 1428 There were no vitals filed for this visit. There is no height or weight on file to calculate BMI.  General:  Elderly black female in no distress  HEENT: normal Lymph: no adenopathy Neck: no JVD Endocrine:  No thryomegaly Vascular: No carotid bruits; FA pulses 2+ bilaterally without bruits  Cardiac:  normal S1, S2; RRR; no murmur   Lungs:  clear to auscultation bilaterally, no wheezing, rhonchi or rales  Abd: soft, nontender, no hepatomegaly  Ext: no edema Musculoskeletal:  No deformities, BUE and BLE strength normal and equal Skin: warm and dry  Neuro:  CNs 2-12 intact, no focal abnormalities noted Psych:  Normal affect   EKG:  The EKG was personally reviewed and demonstrates:  SR PAC;s no acute ST changes Rapid atrial flutter intermittent  Telemetry:  Telemetry was personally reviewed and demonstrates:  Currently SR with multifocal PAC;s   Relevant CV Studies: Myovue dobutamine 2012 normal Echo 2012 normal EF   Laboratory Data:  Chemistry Recent Labs Lab 07/01/17 1318 07/01/17 1320  NA 143 141  K 3.9 4.0  CL 104 107  CO2  --  25  GLUCOSE 118* 122*  BUN 19 19  CREATININE 0.90 1.04*  CALCIUM  --  9.3  GFRNONAA  --  50*  GFRAA  --  58*  ANIONGAP  --  9     Recent Labs Lab 07/01/17 1320  PROT 7.4  ALBUMIN 4.2  AST 31  ALT 54  ALKPHOS 82  BILITOT 0.7   Hematology Recent Labs Lab 07/01/17 1318 07/01/17 1320  WBC  --  9.7  RBC  --  4.21  HGB 13.3 12.3  HCT 39.0 37.7  MCV  --  89.5  MCH  --  29.2  MCHC  --  32.6  RDW  --  14.8  PLT  --  327   Cardiac EnzymesNo results for input(s): TROPONINI in the last  168 hours.  Recent Labs Lab 07/01/17 1317  TROPIPOC 0.02    BNPNo results for input(s): BNP, PROBNP in the last 168 hours.  DDimer  Recent Labs Lab 07/01/17 1320  DDIMER 0.65*    Radiology/Studies:  Dg Chest Port 1 View  Result Date: 07/01/2017 CLINICAL DATA:  Shortness of breath.  Atrial fibrillation. EXAM: PORTABLE CHEST 1 VIEW COMPARISON:  Portable chest dated 09/29/2010 and chest CT dated 11/09/2010. FINDINGS: Improved inspiration. No gross change in  enlargement of the cardiac silhouette. Small amount of linear scarring or atelectasis at the left lung base. Otherwise, clear lungs. Diffuse osteopenia. Mild scoliosis. IMPRESSION: No acute abnormality.  Stable mild cardiomegaly. Electronically Signed   By: Claudie Revering M.D.   On: 07/01/2017 13:55    Assessment and Plan:   1. Flutter:  Start xarelto. Continue iv cardizem and transition to PO in am. She is post ablation No history of CAD or low EF start fleainide 75 bid. Echo to make sure EF normal Will arrange outpatient f/u with Dr Caryl Comes and will need lexiscan myovue as outpatient. Likely 48 hour stay to adjust PO meds 2. Vertigo:  Concern for posterior circulation embolic stroke given PAF/lutter Brain MRI has been ordered Continue meclizine  3.  Cholesterol : continue statin 4. HTN  Continue ARB/Diuretic may need to adjust dose to make room for oral cardizem and rate controlling drug 5. DM:  Discussed low carb diet.  Target hemoglobin A1c is 6.5 or less.  Continue current medications. 6. UTI:  Not clear if this ppt events antibiotics written for by primary service  Interestingly she had no dizziness or vertigo during bouts of rapid flutter in ER    Signed, Jenkins Rouge, MD  07/01/2017 2:28 PM

## 2017-07-01 NOTE — Progress Notes (Addendum)
Called to assist in Rx of rapid recurrent atrial arrhythias She could not tolerate MRI/CT BP is stable 150/70  HR escalates to 200-220 range looks like 1:1 flutter  She is clearly in flutter when she goes slower  She appears to have incipient pulmonary edema  Tried 300 mg flecainide with no conversion will d/c oral flecainide orders And starti IV amiodarone with bolus   Cardizem was d/c for MRI  Restarted and rebolused  Currently stable with flutter at HR 100-110  Discussed with ER primary and nurse Will start iv amiodarone   Transfer to step down at [facility] Will have Dr Caryl Comes and EP see in am  Echo is pending   Rx with Lasix and MSo4 sats still 92%   Critical Care time spent face to face 30 minutes  Jenkins Rouge

## 2017-07-01 NOTE — ED Notes (Signed)
Patient is resting comfortably. 

## 2017-07-01 NOTE — ED Provider Notes (Signed)
5 PM I was called and the patient's room as she was short of breath and appeared uncomfortable. Heart rate tachycardic 2-20 bpm. On exam patient appears uncomfortable. Respiratory distress moderate. Speaks in short sentences. Lungs clear auscultation heart tachycardic abdomen nondistended. ED ECG REPORT   Date: 07/01/2017  Rate: 230  Rhythm: svt  QRS Axis: right  Intervals: normal  ST/T Wave abnormalities: nonspecific T wave changes  Conduction Disutrbances:right bundle branch block  Narrative Interpretation:   Old EKG Reviewed: Rate increased over prior tracing.  I have personally reviewed the EKG tracing and agree with the computerized printout as noted. She received adenosine 6 g IV followed by adenosine 12 g IV, ordered by me. Without change in rhythm area hospitalist physician was consulted who called cardiology who suggested to restart Cardizem administer IV flecanide bolus. At 6 PM patient appears dyspneic although she states her her breathing is improved after having received 10 mg intravenous boluses of Cardizem, 2 separate doses and Cardizem intravenous drip at 10 mg per hour CRITICAL CARE Performed by: Orlie Dakin Total critical care time: 30 minutes Critical care time was exclusive of separately billable procedures and treating other patients. Critical care was necessary to treat or prevent imminent or life-threatening deterioration. Critical care was time spent personally by me on the following activities: development of treatment plan with patient and/or surrogate as well as nursing, discussions with consultants, evaluation of patient's response to treatment, examination of patient, obtaining history from patient or surrogate, ordering and performing treatments and interventions, ordering and review of laboratory studies, ordering and review of radiographic studies, pulse oximetry and re-evaluation of patient's condition.   Orlie Dakin, MD 07/01/17 [phone]

## 2017-07-01 NOTE — ED Notes (Addendum)
While Pt was in MRI, pt was transferred to MRI table, pt's heart rate dropped to 38-45 still in Afib. Pt's oxygen saturation also dropped to 70%. Pt's Cardizem drip was stopped per Dr. Darl Householder and parameters of drug. Pt's oxygen was also increased to 4L [place]. Pt stated she felt as if she could not breathe while she was laying flat. Pt stated she could not tolerate procedure. Dr. Darl Householder was notified of this. Following this pt agreed to attempt CT. Pt's pulse remained stable, however pt's oxygen dropped to 85%. Pt became very anxious and stated she could not tolerate procedure. Pt was taken to room and her heart rate is still in Afib with a rate of 90-120 and is maintaining her oxygen at 97% on 4L [place]. Primary RN, Caryl Pina was notified as well

## 2017-07-01 NOTE — ED Notes (Addendum)
While this RN and a NT was attempting to get pt settled and cleaned up after using the restroom, pt continued to be agitated, and her oxygen saturation decreased. Pt went into vtach and EDP and primary RN were called into the room. 2 doses of adenosine were ordered and administered. Diltiazem was restarted as well Per EDP

## 2017-07-01 NOTE — ED Triage Notes (Signed)
Pt complains of dizziness for the past 2 weeks. Pt states she had noticed intermittent dizziness a few months ago and feels it has gotten progressively worse. Pt went to PCP and was prescribed meclizine, which she states helped for 2 days.

## 2017-07-01 NOTE — Progress Notes (Addendum)
  PROGRESS NOTE  I was called by RN around 5:05pm regarding change in patient status. Patient was in MRI, laid flat with HR dropping to 30s with desaturation to 70%. Cardizem gtt was stopped with improvement in HR to 60-70s. She was brought back to her room, and soon went into SVT vs. A Flutter RVR rate 220s. Dr. Winfred Leeds was at bedside upon my arrival. BP was stable. She was visibly in distress. Initially gave adenosine 6mg , then 12mg , without success. I spoke with Dr. Johnsie Cancel twice during this episode. He recommended restarting Cardizem bolus and drip as well as giving flecanide 300mg  pill-in-pocket. She was given cardizem 10mg  bolus, then started on drip, as well as the flecanide dose. HR still maintaining rate 190-200s. She was given another cardizem bolus 10mg , with improvement in HR in 110-120s. Dr. Johnsie Cancel evaluated patient in ED as well, giving amiodarone as well as lasix for pulmonary edema. Patient to transfer to [facility], discussed with Dr. Johnsie Cancel, [facility] will remain primary service. Updated friend at bedside and daughter over the phone.   Critical care time: 5:05pm-6:15pm. 70 minutes.   Dessa Phi, DO [facility] [email or web address] Password [email or web address] 07/01/2017, 6:01 PM

## 2017-07-01 NOTE — ED Provider Notes (Signed)
[facility] DEPT Provider Note   CSN: [id number] Arrival date & time: 07/01/17  1131     History   Chief Complaint Chief Complaint  Patient presents with  . Dizziness    HPI Lydia Myers Patch is a 76 y.o. female hx of DM, HL, HTN, Here presenting with palpitations, dizziness. Patient states that she has intermittent palpitations and dizziness that started about a week and a half ago. She works as a Scientist, water quality and about week and a half ago she suddenly had acute onset of vertiginous symptoms when she closed her eyes but when she opened her eyes a resolved. She also has intermittent palpitations associated with it. She then went to see her primary care doctor about a week ago and was prescribed meclizine. She finished taking 40 tablets of meclizine but intermittently she still has some dizziness and palpitations. Denies any chest pain or shortness breath. She is here for further evaluation. Patient has history of diabetes, high cholesterol, hypertension but no history of strokes in the past.   The history is provided by the patient.    Past Medical History:  Diagnosis Date  . Arthritis   . Diabetes mellitus   . High cholesterol   . Hypertension     Patient Active Problem List   Diagnosis Date Noted  . ATRIAL FLUTTER 11/09/2010  . CHEST PAIN 11/09/2010  . CARDIOMEGALY 11/08/2010  . ASTHMA, UNSPECIFIED 11/10/2009  . RESPIRATORY FAILURE, CHRONIC 11/10/2009  . WEIGHT GAIN, ABNORMAL 11/10/2009  . HYPERLIPIDEMIA 03/01/2008  . HYPERTENSION 03/01/2008  . ALLERGIC RHINITIS 03/01/2008  . SHORTNESS OF BREATH (SOB) 03/01/2008    Past Surgical History:  Procedure Laterality Date  . HIP SURGERY    . JOINT REPLACEMENT      OB History    No data available       Home Medications    Prior to Admission medications   Medication Sig Start Date End Date Taking? Authorizing Provider  aspirin 81 MG tablet Take 160 mg by mouth daily.   Yes [provider]  AZOPT 1 % ophthalmic  suspension Place 1 drop into both eyes 2 (two) times daily. 05/01/17  Yes [provider]  clotrimazole-betamethasone (LOTRISONE) cream Apply 1 application topically daily as needed for irritation. Apply 1 application every day as needed for vaginal irritation 05/23/17  Yes [provider]  JANUVIA 100 MG tablet Take 100 mg by mouth daily. 05/01/17  Yes [provider]  meclizine (ANTIVERT) 12.5 MG tablet Take 12.5-25 mg by mouth 3 (three) times daily as needed for dizziness. 06/20/17  Yes [provider]  metFORMIN (GLUCOPHAGE) 850 MG tablet Take 850-1,700 mg by mouth 2 (two) times daily. Take 850mg  by mouth in the morning, and take 1700mg  by mouth in the evening 06/12/17  Yes [provider]  simvastatin (ZOCOR) 20 MG tablet Take 20 mg by mouth daily. 04/30/17  Yes [provider]  TRAVATAN Z 0.004 % SOLN ophthalmic solution Place 1 drop into both eyes every evening. 05/01/17  Yes [provider]  valsartan-hydrochlorothiazide (DIOVAN-HCT) 320-25 MG tablet Take 1 tablet by mouth daily. 04/30/17  Yes [provider]    Family History No family history on file.  Social History Social History  Substance Use Topics  . Smoking status: Never Smoker  . Smokeless tobacco: Never Used  . Alcohol use No     Allergies   Shrimp [shellfish allergy]   Review of Systems Review of Systems  Neurological: Positive for dizziness.  All  other systems reviewed and are negative.    Physical Exam Updated Vital Signs BP 140/82 (BP Location: Left Arm)   Pulse (!) 209   Temp 98.1 F (36.7 C) (Oral)   Resp (!) 21   SpO2 100%   Physical Exam  Constitutional: She is oriented to person, place, and time. She appears well-developed.  HENT:  Head: Normocephalic.  Eyes: Pupils are equal, round, and reactive to light. Conjunctivae and EOM are normal.  Neck: Normal range of motion. Neck supple.  Cardiovascular: Normal rate, regular rhythm and  normal heart sounds.   Pulmonary/Chest: Effort normal and breath sounds normal. No respiratory distress. She has no wheezes. She has no rales.  Abdominal: Soft. Bowel sounds are normal. She exhibits no distension. There is no tenderness.  Musculoskeletal: Normal range of motion.  Neurological: She is alert and oriented to person, place, and time. No cranial nerve deficit. Coordination normal.  CN 2-12 intact. Nl strength and sensation throughout. Nl gait   Skin: Skin is warm.  Psychiatric: She has a normal mood and affect.  Nursing note and vitals reviewed.    ED Treatments / Results  Labs (all labs ordered are listed, but only abnormal results are displayed) Labs Reviewed  URINALYSIS, ROUTINE W REFLEX MICROSCOPIC - Abnormal; Notable for the following:       Result Value   APPearance HAZY (*)    Hgb urine dipstick SMALL (*)    Protein, ur 100 (*)    Nitrite POSITIVE (*)    Leukocytes, UA MODERATE (*)    All other components within normal limits  URINALYSIS, MICROSCOPIC (REFLEX) - Abnormal; Notable for the following:    Bacteria, UA MANY (*)    Squamous Epithelial / LPF 0-5 (*)    All other components within normal limits  I-STAT CHEM 8, ED - Abnormal; Notable for the following:    Glucose, Bld 118 (*)    All other components within normal limits  PROTIME-INR  APTT  CBC  DIFFERENTIAL  COMPREHENSIVE METABOLIC PANEL  D-DIMER, QUANTITATIVE (NOT AT [facility])  I-STAT TROPONIN, ED    EKG  EKG Interpretation  Date/Time:  Monday July 01 2017 13:22:44 EDT Ventricular Rate:  132 PR Interval:    QRS Duration: 103 QT Interval:  275 QTC Calculation: 450 R Axis:   85 Text Interpretation:  Atrial fibrillation with rapid V-rate Borderline right axis deviation Repolarization abnormality, prob rate related Artifact in lead(s) I II aVR and baseline wander in lead(s) II III aVF aflutter new since previous  Confirmed by Wandra Arthurs [phone]) on 07/01/2017 1:36:00 PM       Radiology No  results found.  Procedures Procedures (including critical care time)  CRITICAL CARE Performed by: Wandra Arthurs   Total critical care time: 30  minutes  Critical care time was exclusive of separately billable procedures and treating other patients.  Critical care was necessary to treat or prevent imminent or life-threatening deterioration.  Critical care was time spent personally by me on the following activities: development of treatment plan with patient and/or surrogate as well as nursing, discussions with consultants, evaluation of patient's response to treatment, examination of patient, obtaining history from patient or surrogate, ordering and performing treatments and interventions, ordering and review of laboratory studies, ordering and review of radiographic studies, pulse oximetry and re-evaluation of patient's condition.;   Medications Ordered in ED Medications  diltiazem (CARDIZEM) 100 mg in dextrose 5% 161mL (1 mg/mL) infusion (not administered)  adenosine (ADENOCARD) 6 MG/2ML injection  6 mg (6 mg Intravenous Given 07/01/17 1318)  sodium chloride 0.9 % bolus 1,000 mL (1,000 mLs Intravenous New Bag/Given 07/01/17 1317)  adenosine (ADENOCARD) 6 MG/2ML injection 12 mg (12 mg Intravenous Given 07/01/17 1322)  diltiazem (CARDIZEM) injection 10 mg (10 mg Intravenous Given 07/01/17 1335)     Initial Impression / Assessment and Plan / ED Course  I have reviewed the triage vital signs and the nursing notes.  Pertinent labs & imaging results that were available during my care of the patient were reviewed by me and considered in my medical decision making (see chart for details).     Lydia Myers is a 76 y.o. female here presenting with dizziness, palpitations. Consider arrhythmias vs TIA vs ACS. Will get labs, MRI brain.   1:30 pm Patient went into SVT vs rapid afib. Patient has palpitations currently. I went to the room and appears to be in SVT. Tried vagal maneuvers  unsuccessfully. Tried adenosine 6 mg then 12 mg and rate slowed down and revealed underlying aflutter. Will give cardizem bolus and drip.   2:22 PM HR down to low 100s with cardizem bolus and drip. Patient's D-dimer mildly elevated. CT angio ordered. Cardiology consulted to see patient as consult. MRI brain still pending but patient not stable to go there yet. Will admit to stepdown for new onset rapid afib on cardizem drip. I held heparin or anticoagulation for now pending MRI brain results. If MRI showed ischemic stroke then she will not be a good candidate for anticoagulation and may need neuro consult before anticoagulation. hospitalist aware and will follow up CT and MRI results.    Final Clinical Impressions(s) / ED Diagnoses   Final diagnoses:  None    New Prescriptions New Prescriptions   No medications on file     Drenda Freeze, MD 07/01/17 [phone]

## 2017-07-02 ENCOUNTER — Inpatient Hospital Stay (HOSPITAL_COMMUNITY): Payer: Medicare Other

## 2017-07-02 ENCOUNTER — Encounter (HOSPITAL_COMMUNITY): Payer: Self-pay | Admitting: Physician Assistant

## 2017-07-02 ENCOUNTER — Other Ambulatory Visit (HOSPITAL_COMMUNITY): Payer: Medicare Other

## 2017-07-02 DIAGNOSIS — R7989 Other specified abnormal findings of blood chemistry: Secondary | ICD-10-CM

## 2017-07-02 DIAGNOSIS — I340 Nonrheumatic mitral (valve) insufficiency: Secondary | ICD-10-CM

## 2017-07-02 DIAGNOSIS — I5032 Chronic diastolic (congestive) heart failure: Secondary | ICD-10-CM

## 2017-07-02 DIAGNOSIS — E784 Other hyperlipidemia: Secondary | ICD-10-CM

## 2017-07-02 DIAGNOSIS — I272 Pulmonary hypertension, unspecified: Secondary | ICD-10-CM

## 2017-07-02 DIAGNOSIS — B961 Klebsiella pneumoniae [K. pneumoniae] as the cause of diseases classified elsewhere: Secondary | ICD-10-CM

## 2017-07-02 DIAGNOSIS — I10 Essential (primary) hypertension: Secondary | ICD-10-CM

## 2017-07-02 DIAGNOSIS — N390 Urinary tract infection, site not specified: Secondary | ICD-10-CM

## 2017-07-02 DIAGNOSIS — E0590 Thyrotoxicosis, unspecified without thyrotoxic crisis or storm: Secondary | ICD-10-CM

## 2017-07-02 DIAGNOSIS — E118 Type 2 diabetes mellitus with unspecified complications: Secondary | ICD-10-CM

## 2017-07-02 LAB — GLUCOSE, CAPILLARY
GLUCOSE-CAPILLARY: 128 mg/dL — AB (ref 65–99)
GLUCOSE-CAPILLARY: 133 mg/dL — AB (ref 65–99)
GLUCOSE-CAPILLARY: 156 mg/dL — AB (ref 65–99)
Glucose-Capillary: 142 mg/dL — ABNORMAL HIGH (ref 65–99)

## 2017-07-02 LAB — BASIC METABOLIC PANEL
Anion gap: 15 (ref 5–15)
BUN: 16 mg/dL (ref 6–20)
CALCIUM: 8.1 mg/dL — AB (ref 8.9–10.3)
CO2: 20 mmol/L — ABNORMAL LOW (ref 22–32)
Chloride: 105 mmol/L (ref 101–111)
Creatinine, Ser: 0.99 mg/dL (ref 0.44–1.00)
GFR calc Af Amer: >60 mL/min (ref >60)
GFR, EST NON AFRICAN AMERICAN: 53 mL/min — AB (ref >60)
GLUCOSE: 219 mg/dL — AB (ref 65–99)
Potassium: 4 mmol/L (ref 3.5–5.1)
Sodium: 140 mmol/L (ref 135–145)

## 2017-07-02 LAB — LIPID PANEL
Cholesterol: 146 mg/dL (ref 0–200)
HDL: 67 mg/dL (ref >40)
LDL CALC: 69 mg/dL (ref 0–99)
Total CHOL/HDL Ratio: 2.2 RATIO
Triglycerides: 52 mg/dL (ref <150)
VLDL: 10 mg/dL (ref 0–40)

## 2017-07-02 LAB — CBC
HCT: 36.7 % (ref 36.0–46.0)
Hemoglobin: 11.8 g/dL — ABNORMAL LOW (ref 12.0–15.0)
MCH: 28.9 pg (ref 26.0–34.0)
MCHC: 32.2 g/dL (ref 30.0–36.0)
MCV: 90 fL (ref 78.0–100.0)
PLATELETS: 253 10*3/uL (ref 150–400)
RBC: 4.08 MIL/uL (ref 3.87–5.11)
RDW: 14.9 % (ref 11.5–15.5)
WBC: 12.2 10*3/uL — ABNORMAL HIGH (ref 4.0–10.5)

## 2017-07-02 LAB — HEMOGLOBIN A1C
Hgb A1c MFr Bld: 6.2 % — ABNORMAL HIGH (ref 4.8–5.6)
Mean Plasma Glucose: 131.24 mg/dL

## 2017-07-02 LAB — ECHOCARDIOGRAM COMPLETE
Height: 61 in
WEIGHTICAEL: 2656 [oz_av]

## 2017-07-02 LAB — T4, FREE: FREE T4: 1.01 ng/dL (ref 0.61–1.12)

## 2017-07-02 LAB — TSH: TSH: 6.66 u[IU]/mL — ABNORMAL HIGH (ref 0.350–4.500)

## 2017-07-02 LAB — MRSA PCR SCREENING: MRSA BY PCR: NEGATIVE

## 2017-07-02 MED ORDER — LORAZEPAM 2 MG/ML IJ SOLN: 2.0000 mg | Freq: Once | INTRAMUSCULAR | Status: DC

## 2017-07-02 MED ORDER — FUROSEMIDE 10 MG/ML IJ SOLN
40.0000 mg | Freq: Every day | INTRAMUSCULAR | Status: DC
  Administered 2017-07-02 – 2017-07-03 (×2): 40 mg via INTRAVENOUS
  Filled 2017-07-02 (×2): qty 4

## 2017-07-02 MED ORDER — METOPROLOL TARTRATE 5 MG/5ML IV SOLN
2.5000 mg | INTRAVENOUS | Status: AC | PRN
  Administered 2017-07-02 (×3): 2.5 mg via INTRAVENOUS
  Filled 2017-07-02 (×2): qty 5

## 2017-07-02 MED ORDER — AMIODARONE HCL 200 MG PO TABS
200.0000 mg | ORAL_TABLET | Freq: Every day | ORAL | Status: DC
  Administered 2017-07-02: 200 mg via ORAL
  Filled 2017-07-02: qty 1

## 2017-07-02 MED ORDER — ORAL CARE MOUTH RINSE
15.0000 mL | Freq: Two times a day (BID) | OROMUCOSAL | Status: DC
  Administered 2017-07-02 – 2017-07-04 (×3): 15 mL via OROMUCOSAL

## 2017-07-02 MED ORDER — IOPAMIDOL (ISOVUE-370) INJECTION 76%
INTRAVENOUS | Status: AC
  Filled 2017-07-02: qty 100

## 2017-07-02 NOTE — Progress Notes (Signed)
Pt refused to go to CT scan. Spoke to pt about reasons why test is ordered. Will paged MD, and continue to monitor pt.

## 2017-07-02 NOTE — Progress Notes (Signed)
Nutrition Brief Note  Patient identified on the Malnutrition Screening Tool (MST) Report  Wt Readings from Last 15 Encounters:  07/02/17 166 lb (75.3 kg)  11/16/10 202 lb (91.6 kg)  11/09/10 207 lb (93.9 kg)  11/02/10 212 lb 8 oz (96.4 kg)  06/22/10 212 lb 8 oz (96.4 kg)  05/11/10 211 lb (95.7 kg)  02/03/10 210 lb (95.3 kg)  01/04/10 214 lb (97.1 kg)  12/12/09 215 lb (97.5 kg)  11/10/09 220 lb (99.8 kg)  04/28/08 205 lb (93 kg)  03/25/08 207 lb (93.9 kg)  03/01/08 205 lb (93 kg)   Lydia Myers is a 76 y.o. female with medical history significant of A Flutter in the past s/p ablation in 2011 with Dr. Caryl Comes, HTN, DM, HLD; she presents with two-week history of dizziness.   Spoke with pt, who reports great appetite. She generally consumes 3 meals per day, focusing on softer texture foods due to lack of teeth. She shares that she has intentionally lost weight by making healthier food choices.   Nutrition-Focused physical exam completed. Findings are no fat depletion, no muscle depletion, and no edema.   Body mass index is 31.37 kg/m. Patient meets criteria for obesity, class I based on current BMI.   Current diet order is Heart Healthy/Carb Modified, patient is consuming approximately 100% of meals at this time. Labs and medications reviewed.   No nutrition interventions warranted at this time. If nutrition issues arise, please consult RD.   Lydia Myers, RD, LDN, CDE Pager: [phone] After hours Pager: [phone]

## 2017-07-02 NOTE — Progress Notes (Signed)
PROGRESS NOTE    Lydia Myers  TKZ:[id number] DOB: 1941/03/17 DOA: 07/01/2017 PCP: Kelton Pillar, MD   Brief Narrative:  76 y.o.BF PMHx A Flutter in the past s/p ablation in 2011 with Dr. Caryl Comes, HTN, COPD, DM type 2 controlled, HLD;   Presents with two-week history of dizziness. She states that the room spins around her, causing her to sit down. She has not passed out from this. She was diagnosed with vertigo by her primary care physician and was prescribed meclizine. Despite taking medication, she continued to be very dizzy. Today, she also noted heart palpitations like "the heart was trying to feed the brain." She denies any fevers or chills, no chest pain or pressure, denies any shortness of breath. No previous nausea or vomiting, no abdominal pain. She does also admit to dysuria. No swelling of her extremities.     Subjective: 8/28 A/O 4, negative CP, positive SOB, negative N/V, negative abdominal pain. States in the past had similar symptoms and saw Dr. Caryl Comes EDP for an ablation. States not on home O2   Assessment & Plan:   Active Problems:   Atrial fibrillation with rapid ventricular response (HCC)   Atrial fibrillation with RVR (HCC)  Chronic diastolic CHF -Strict in and out -Daily weight -See SVT  Severe Pulmonary HTN -See SVT  SVT and underlying A Flutter/Fib (CHADSVASc>5) -Hx of A Flutter s/p ablation by Dr. Caryl Comes in 2011 (report under media tab)  - Amiodarone 200 mg daily -Irbesartan 300 mg daily -HCTZ 25 mg daily -Xarelto  Essential HTN -See SVT  Hyperthyroidism (subacute?) -TSH 6.6 -Obtain free T4/free T3   Dizziness  -Resolved most likely secondary to a flutter but given patient's risk factors obtain MRI -MRI brain pending   Elevated d dimer -CTA chest to rule out PE    positive Klebsiella pneumoniae UTI  -Continue current antibiotic awaiting sensitivities     DM type 2 controlled with complication -Hold home PO meds -8/27 Hemoglobin A1c =  6.2   -Sensitive SSI   HLD -Lipid panel within ADA guidelines - Zocor 20 mg daily    DVT prophylaxis: Xarelto Code Status: DO NOT RESUSCITATE Family Communication: Family present discussion of plan of care Disposition Plan: TBD   Consultants:  Cardiology     Procedures/Significant Events:  Echocardiogram:Left ventricle: mild LVH. Systolic function was normal. -LVEF=   55% to 60%. -Grade 2 diastolic dysfunction  -Mitral valve: moderate regurgitation. - Pulmonary arteries: PA  peak pressure: 81 mm Hg (S).    I have personally reviewed and interpreted all radiology studies and my findings are as above.  VENTILATOR SETTINGS: None   Cultures 8/27 positive Klebsiella pneumoniae 8/28 blood pending      Antimicrobials: Anti-infectives    Start     Stop   07/02/17 1500  cefTRIAXone (ROCEPHIN) 1 g in dextrose 5 % 50 mL IVPB         07/02/17 0000  cefTRIAXone (ROCEPHIN) 1 g in dextrose 5 % 50 mL IVPB  Status:  Discontinued     07/01/17 2331   07/01/17 1400  cefTRIAXone (ROCEPHIN) 1 g in dextrose 5 % 50 mL IVPB     07/01/17 1514       Devices    LINES / TUBES:      Continuous Infusions: . amiodarone 30 mg/hr (07/02/17 0603)  . cefTRIAXone (ROCEPHIN)  IV    . diltiazem (CARDIZEM) infusion 5 mg/hr (07/01/17 2323)     Objective: Vitals:   07/01/17 2029 07/01/17  2109 07/01/17 2229 07/01/17 2356  BP:   (!) 123/54   Pulse:  (!) 151 76   Resp:  (!) 27 19   Temp: 98.3 F (36.8 C)  98 F (36.7 C) 98.2 F (36.8 C)  TempSrc: Oral  Oral Oral  SpO2:  97% 97%   Weight:   166 lb 3.6 oz (75.4 kg)     Intake/Output Summary (Last 24 hours) at 07/02/17 0746 Last data filed at 07/02/17 0400  Gross per 24 hour  Intake           407.82 ml  Output              700 ml  Net          -292.18 ml   Filed Weights   07/01/17 2229  Weight: 166 lb 3.6 oz (75.4 kg)    Examination:  General: A/O 4, positive acute respiratory distress Neck:  Negative scars,  masses, torticollis, lymphadenopathy, JVD Lungs: Clear to auscultation bilaterally without wheezes or crackles Cardiovascular: Irregular irregular rhythm and rate, without murmur gallop or rub normal S1 and S2 Abdomen: negative abdominal pain, nondistended, positive soft, bowel sounds, no rebound, no ascites, no appreciable mass Extremities: No significant cyanosis, clubbing. +2-3 bilateral pedal edema Psychiatric:  Negative depression, negative anxiety, negative fatigue, negative mania  Central nervous system:  Cranial nerves II through XII intact, tongue/uvula midline, all extremities muscle strength 5/5, sensation intact throughout, , negative dysarthria, negative expressive aphasia, negative receptive aphasia.  .     Data Reviewed: Care during the described time interval was provided by me .  I have reviewed this patient's available data, including medical history, events of note, physical examination, and all test results as part of my evaluation.   CBC:  Recent Labs Lab 07/01/17 1318 07/01/17 1320 07/02/17 0238  WBC  --  9.7 12.2*  NEUTROABS  --  7.4  --   HGB 13.3 12.3 11.8*  HCT 39.0 37.7 36.7  MCV  --  89.5 90.0  PLT  --  327 387   Basic Metabolic Panel:  Recent Labs Lab 07/01/17 1318 07/01/17 1320 07/02/17 0238  NA 143 141 140  K 3.9 4.0 4.0  CL 104 107 105  CO2  --  25 20*  GLUCOSE 118* 122* 219*  BUN 19 19 16   CREATININE 0.90 1.04* 0.99  CALCIUM  --  9.3 8.1*   GFR: CrCl cannot be calculated (Unknown ideal weight.). Liver Function Tests:  Recent Labs Lab 07/01/17 1320  AST 31  ALT 54  ALKPHOS 82  BILITOT 0.7  PROT 7.4  ALBUMIN 4.2   No results for input(s): LIPASE, AMYLASE in the last 168 hours. No results for input(s): AMMONIA in the last 168 hours. Coagulation Profile:  Recent Labs Lab 07/01/17 1320  INR 1.16   Cardiac Enzymes: No results for input(s): CKTOTAL, CKMB, CKMBINDEX, TROPONINI in the last 168 hours. BNP (last 3 results) No  results for input(s): PROBNP in the last 8760 hours. HbA1C:  Recent Labs  07/01/17 2345  HGBA1C 6.2*   CBG: No results for input(s): GLUCAP in the last 168 hours. Lipid Profile:  Recent Labs  07/02/17 0238  CHOL 146  HDL 67  LDLCALC 69  TRIG 52  CHOLHDL 2.2   Thyroid Function Tests: No results for input(s): TSH, T4TOTAL, FREET4, T3FREE, THYROIDAB in the last 72 hours. Anemia Panel: No results for input(s): VITAMINB12, FOLATE, FERRITIN, TIBC, IRON, RETICCTPCT in the last 72 hours. Urine analysis:  Component Value Date/Time   COLORURINE YELLOW 07/01/2017 1259   APPEARANCEUR HAZY (A) 07/01/2017 1259   LABSPEC 1.015 07/01/2017 1259   PHURINE 6.0 07/01/2017 1259   GLUCOSEU NEGATIVE 07/01/2017 1259   HGBUR SMALL (A) 07/01/2017 1259   BILIRUBINUR NEGATIVE 07/01/2017 1259   KETONESUR NEGATIVE 07/01/2017 1259   PROTEINUR 100 (A) 07/01/2017 1259   UROBILINOGEN 1.0 09/30/2010 1812   NITRITE POSITIVE (A) 07/01/2017 1259   LEUKOCYTESUR MODERATE (A) 07/01/2017 1259   Sepsis Labs: @LABRCNTIP (procalcitonin:4,lacticidven:4)  ) Recent Results (from the past 240 hour(s))  MRSA PCR Screening     Status: None   Collection Time: 07/01/17 11:35 PM  Result Value Ref Range Status   MRSA by PCR NEGATIVE NEGATIVE Final    Comment:        The GeneXpert MRSA Assay (FDA approved for NASAL specimens only), is one component of a comprehensive MRSA colonization surveillance program. It is not intended to diagnose MRSA infection nor to guide or monitor treatment for MRSA infections.          Radiology Studies: Dg Chest Port 1 View  Result Date: 07/01/2017 CLINICAL DATA:  Shortness of breath.  Atrial fibrillation. EXAM: PORTABLE CHEST 1 VIEW COMPARISON:  Portable chest dated 09/29/2010 and chest CT dated 11/09/2010. FINDINGS: Improved inspiration. No gross change in enlargement of the cardiac silhouette. Small amount of linear scarring or atelectasis at the left lung base.  Otherwise, clear lungs. Diffuse osteopenia. Mild scoliosis. IMPRESSION: No acute abnormality.  Stable mild cardiomegaly. Electronically Signed   By: Claudie Revering M.D.   On: 07/01/2017 13:55        Scheduled Meds: . brinzolamide  1 drop Both Eyes BID  . fentaNYL (SUBLIMAZE) injection  100 mcg Intravenous Once  . irbesartan  300 mg Oral Daily   And  . hydrochlorothiazide  25 mg Oral Daily  . insulin aspart  0-9 Units Subcutaneous TID WC  . latanoprost  1 drop Both Eyes QHS  . mouth rinse  15 mL Mouth Rinse BID  . midazolam  3 mg Intravenous Once  . rivaroxaban  20 mg Oral Q supper  . simvastatin  20 mg Oral Daily   Continuous Infusions: . amiodarone 30 mg/hr (07/02/17 0603)  . cefTRIAXone (ROCEPHIN)  IV    . diltiazem (CARDIZEM) infusion 5 mg/hr (07/01/17 2323)     LOS: 1 day    Time spent: 40 minutes    WOODS, Geraldo Docker, MD [facility] Pager [phone]   If 7PM-7AM, please contact night-coverage [email or web address] Password [facility] 07/02/2017, 7:46 AM

## 2017-07-02 NOTE — Progress Notes (Signed)
  Echocardiogram 2D Echocardiogram has been performed.  Lydia Myers 07/02/2017, 2:48 PM

## 2017-07-02 NOTE — Consult Note (Signed)
Cardiology Consultation:   Patient ID: Lydia Myers; [id number]; 02-13-1941   Admit date: 07/01/2017 Date of Consult: 07/02/2017  Primary Care Provider: Kelton Pillar, MD Primary Electrophysiologist:  none Historically has seen Dr. Caryl Comes, 2012 last   Patient Profile:   Lydia Myers is a 76 y.o. female with a hx of AFlutter ablated in 2011, HTN, DM, HLD who is being seen today for the evaluation of AFlutter w/RVR at the request of Dr. Johnsie Cancel.  History of Present Illness:   Lydia Myers has been having significant trouble with palpitations and what she describes as "crazy spinning".  She states the room was moving faster the she was and she was unable to stand comfortably, not feling lightheaded.  She did not seek attention for about a week until eventually she did feel a near fainting episodes and the next day saw PMD, she was prescribed antivert that she took for 7 days, after 5 days her spinning resolved but not the palpitations.  After 2 days without the antivert the spinning started back up and was told to come to the ER.  Denies any c/o CP, no syncope.  Initially given adenosine noting clear AFlutter and started on Xarelto and planned for Flecainide though developed RVR w/1:1 conduction started amiodarone and dilt gtts.  She was planned for MRI brain to evaluate potential neurological event given vertigo and AFlutter but she was unable to tolerate the test, planned for CT chest to r/o PE as well, though not done.  She is being treated for UTI which she states shehas been having recurrent issues with for the last 8 months.  Presenting LABS K+ 3.9  BUN/Creat 19/0.90 LFTs wnl poc Trop 0.02 WBC 9.7 H/H 12/37 plts 327 DDimer 0.65 (H) nml 0-0.50  Currently she is feeling much better, no palpitations, no spinning sensation, though describes SOB when laying down, had to sleep last night with HOB elevated to seated. This is improved some, but continues and unable to lay flat yet,  denies SOB sitting up.   Past Medical History:  Diagnosis Date  . Arthritis   . Asthma   . COPD (chronic obstructive pulmonary disease) ([place])   . Diabetes mellitus   . Glaucoma   . High cholesterol   . Hypertension     Past Surgical History:  Procedure Laterality Date  . ABDOMINAL HYSTERECTOMY    . HIP SURGERY    . JOINT REPLACEMENT    . left lumpectomy    . REPLACEMENT TOTAL KNEE BILATERAL     2009-Right, 2001-Left  . Right bog toe fusion    . Right trigger finger surgery       Home Medications:  Prior to Admission medications   Medication Sig Start Date End Date Taking? Authorizing Provider  aspirin 81 MG tablet Take 160 mg by mouth daily.   Yes [provider]  AZOPT 1 % ophthalmic suspension Place 1 drop into both eyes 2 (two) times daily. 05/01/17  Yes [provider]  clotrimazole-betamethasone (LOTRISONE) cream Apply 1 application topically daily as needed for irritation. Apply 1 application every day as needed for vaginal irritation 05/23/17  Yes [provider]  JANUVIA 100 MG tablet Take 100 mg by mouth daily. 05/01/17  Yes [provider]  meclizine (ANTIVERT) 12.5 MG tablet Take 12.5-25 mg by mouth 3 (three) times daily as needed for dizziness. 06/20/17  Yes [provider]  metFORMIN (GLUCOPHAGE) 850 MG tablet Take 850-1,700 mg by mouth 2 (two) times daily.  Take 850mg  by mouth in the morning, and take 1700mg  by mouth in the evening 06/12/17  Yes [provider]  simvastatin (ZOCOR) 20 MG tablet Take 20 mg by mouth daily. 04/30/17  Yes [provider]  TRAVATAN Z 0.004 % SOLN ophthalmic solution Place 1 drop into both eyes every evening. 05/01/17  Yes [provider]  valsartan-hydrochlorothiazide (DIOVAN-HCT) 320-25 MG tablet Take 1 tablet by mouth daily. 04/30/17  Yes [provider]    Inpatient Medications: Scheduled Meds: . brinzolamide  1 drop Both Eyes BID  . fentaNYL (SUBLIMAZE)  injection  100 mcg Intravenous Once  . furosemide  40 mg Intravenous Daily  . irbesartan  300 mg Oral Daily   And  . hydrochlorothiazide  25 mg Oral Daily  . insulin aspart  0-9 Units Subcutaneous TID WC  . latanoprost  1 drop Both Eyes QHS  . mouth rinse  15 mL Mouth Rinse BID  . midazolam  3 mg Intravenous Once  . rivaroxaban  20 mg Oral Q supper  . simvastatin  20 mg Oral Daily   Continuous Infusions: . amiodarone 30 mg/hr (07/02/17 0603)  . cefTRIAXone (ROCEPHIN)  IV     PRN Meds: iopamidol, ondansetron (ZOFRAN) IV, white petrolatum  Allergies:    Allergies  Allergen Reactions  . Shrimp [Shellfish Allergy] Nausea And Vomiting    Social History:   Social History   Social History  . Marital status: Single    Spouse name: N/A  . Number of children: N/A  . Years of education: N/A   Occupational History  . Not on file.   Social History Main Topics  . Smoking status: Never Smoker  . Smokeless tobacco: Never Used  . Alcohol use No  . Drug use: No  . Sexual activity: No   Other Topics Concern  . Not on file   Social History Narrative  . No narrative on file    Family History:   Family History  Problem Relation Age of Onset  . Diabetes Brother      ROS:  Please see the history of present illness.  ROS  All other ROS reviewed and negative.     Physical Exam/Data:   Vitals:   07/02/17 0600 07/02/17 0700 07/02/17 0800 07/02/17 1237  BP: 106/67 96/67 107/80 121/64  Pulse: 61 (!) 58 67 71  Resp: 18 18 20 20   Temp:   97.6 F (36.4 C) (!) 97.3 F (36.3 C)  TempSrc:   Oral Oral  SpO2: 99% 99% 100% 99%  Weight:    166 lb (75.3 kg)  Height:    5\' 1"  (1.549 m)    Intake/Output Summary (Last 24 hours) at 07/02/17 1342 Last data filed at 07/02/17 1236  Gross per 24 hour  Intake           647.82 ml  Output              700 ml  Net           -52.18 ml   Filed Weights   07/01/17 2229 07/02/17 1237  Weight: 166 lb 3.6 oz (75.4 kg) 166 lb (75.3 kg)    Body mass index is 31.37 kg/m.  General:  Well nourished, well developed, in no acute distress HEENT: normal Lymph: no adenopathy Neck: no JVD Endocrine:  No thryomegaly Vascular: No carotid bruits Cardiac:  RRR; soft SM, no gallops or rubs Lungs:  Diminished at the bases, no wheezing, rhonchi or rales  Abd:  soft, nontender, no hepatomegaly  Ext: trace-1+ edema Musculoskeletal:  No deformities, age appropriate atrophy Skin: warm and dry  Neuro: no gross focal abnormalities noted Psych:  Normal affect   EKG:  The EKG was personally reviewed and demonstrates:   #1 SR, PACs, PVC, 96bpm, QRS 15ms #2 is AFlutter 1:1 218bpm #3 is AFlutter, 132bpm Telemetry:  Telemetry was personally reviewed and demonstrates:  SR, occ PVCs  Relevant CV Studies:  Echo is ordered, pending  Myovue dobutamine 2012 normal Echo 2012 normal EF   Laboratory Data:  Chemistry  Recent Labs Lab 07/01/17 1318 07/01/17 1320 07/02/17 0238  NA 143 141 140  K 3.9 4.0 4.0  CL 104 107 105  CO2  --  25 20*  GLUCOSE 118* 122* 219*  BUN 19 19 16   CREATININE 0.90 1.04* 0.99  CALCIUM  --  9.3 8.1*  GFRNONAA  --  50* 53*  GFRAA  --  58* >60  ANIONGAP  --  9 15     Recent Labs Lab 07/01/17 1320  PROT 7.4  ALBUMIN 4.2  AST 31  ALT 54  ALKPHOS 82  BILITOT 0.7   Hematology  Recent Labs Lab 07/01/17 1318 07/01/17 1320 07/02/17 0238  WBC  --  9.7 12.2*  RBC  --  4.21 4.08  HGB 13.3 12.3 11.8*  HCT 39.0 37.7 36.7  MCV  --  89.5 90.0  MCH  --  29.2 28.9  MCHC  --  32.6 32.2  RDW  --  14.8 14.9  PLT  --  327 253   Cardiac EnzymesNo results for input(s): TROPONINI in the last 168 hours.   Recent Labs Lab 07/01/17 1317  TROPIPOC 0.02    BNPNo results for input(s): BNP, PROBNP in the last 168 hours.  DDimer   Recent Labs Lab 07/01/17 1320  DDIMER 0.65*    Radiology/Studies:  Dg Chest Port 1 View Result Date: 07/01/2017 CLINICAL DATA:  Shortness of breath.  Atrial  fibrillation. EXAM: PORTABLE CHEST 1 VIEW COMPARISON:  Portable chest dated 09/29/2010 and chest CT dated 11/09/2010. FINDINGS: Improved inspiration. No gross change in enlargement of the cardiac silhouette. Small amount of linear scarring or atelectasis at the left lung base. Otherwise, clear lungs. Diffuse osteopenia. Mild scoliosis. IMPRESSION: No acute abnormality.  Stable mild cardiomegaly. Electronically Signed   By: Claudie Revering M.D.   On: 07/01/2017 13:55    Assessment and Plan:   1. AFlutter w/RVR 1:1     Xarelto started     Ditiazem gtt is off, remains on amiodarone gtt     Check TSH     Change to PO amiodarone       2. Fluid OL     Echo is pending, she has been having racing heart beats for weeks     Will continue IV lasix  2. Vertigo     Not active c/o at this time     Defer neuro eval/management to medicine team  3. HTN     Looks OK  4. UTI     As per medicine team  5. Elevated D. Dimer     SOB sounds orthoppnic and ordered diuresed     Severely elevated PA pressure by echo     D/w Dr. Rayann Heman     Will defer w/u to r/o PE to medicine service           Signed, Baldwin [place], PA-C  07/02/2017 1:42 PM   I have seen, examined  the patient, and reviewed the above assessment and plan.  On exam, RRR.  Changes to above are made where necessary.  PT with very fast atrial arrhythmias, now back in sinus rhythm with amiodarone.  Would convert amiodarone to oral.  Anticipate discharge tomorrow with close outpatient follow-up in the AF clinic.  She has increased oxygen requirement, likely due to CHF in the setting of RVR.  Diuresis is advised.  Mildly elevated dDimer of unknown significance.  Will defer workup to primary team if indicated.  chads2vasc score is 5.  Continue long term anticoagulation. Follow-up with Dr Caryl Comes in the office.  Co Sign: Thompson Grayer, MD

## 2017-07-02 NOTE — Progress Notes (Signed)
[place] for xarelto Indication: atrial fibrillation  Allergies  Allergen Reactions  . Shrimp [Shellfish Allergy] Nausea And Vomiting    Patient Measurements: Weight: 166 lb 3.6 oz (75.4 kg) Heparin Dosing Weight:   Vital Signs: Temp: 97.6 F (36.4 C) (08/28 0800) Temp Source: Oral (08/28 0800) BP: 107/80 (08/28 0800) Pulse Rate: 67 (08/28 0800)  Labs:  Recent Labs  07/01/17 1318 07/01/17 1320 07/02/17 0238  HGB 13.3 12.3 11.8*  HCT 39.0 37.7 36.7  PLT  --  327 253  APTT  --  30  --   LABPROT  --  14.9  --   INR  --  1.16  --   CREATININE 0.90 1.04* 0.99    CrCl cannot be calculated (Unknown ideal weight.).   Medical History: Past Medical History:  Diagnosis Date  . Arthritis   . Asthma   . COPD (chronic obstructive pulmonary disease) ([place])   . Diabetes mellitus   . Glaucoma   . High cholesterol   . Hypertension     Medications:  Scheduled:  . brinzolamide  1 drop Both Eyes BID  . fentaNYL (SUBLIMAZE) injection  100 mcg Intravenous Once  . irbesartan  300 mg Oral Daily   And  . hydrochlorothiazide  25 mg Oral Daily  . insulin aspart  0-9 Units Subcutaneous TID WC  . latanoprost  1 drop Both Eyes QHS  . mouth rinse  15 mL Mouth Rinse BID  . midazolam  3 mg Intravenous Once  . rivaroxaban  20 mg Oral Q supper  . simvastatin  20 mg Oral Daily   Infusions:  . amiodarone 30 mg/hr (07/02/17 0603)  . cefTRIAXone (ROCEPHIN)  IV    . diltiazem (CARDIZEM) infusion 5 mg/hr (07/01/17 2323)    Assessment: 76 yo female with afib/flutter last night, converted to NSR this am.   Patient continues on xarelto for anticoagulation. No need for dose adjustments.  This patients CHA2DS2-VASc Score and unadjusted Ischemic Stroke Rate (% per year) is equal to 7.2 % stroke rate/year from a score of 5  Above score calculated as 1 point each if present [CHF, HTN, DM, Vascular=MI/PAD/Aortic Plaque, Age if 65-74, or  Female] Above score calculated as 2 points each if present [Age > 75, or Stroke/TIA/TE]      0 Has Congestive Heart Failure:  No   0 Has Vascular Disease:  No   1 Has Hypertension:  Yes   2 Age:  76   1 Has Diabetes:  Yes   0 Had Stroke:  No Had TIA:  No Had thromboembolism:  No   1 Female:  Yes    Goal of Therapy:   Monitor platelets by anticoagulation protocol: Yes   Plan:  - Xarelto 20 mg po daily  - monitor CBC as indicated  Erin Hearing PharmD., BCPS Clinical Pharmacist Pager [phone] 07/02/2017 9:36 AM

## 2017-07-03 ENCOUNTER — Inpatient Hospital Stay (HOSPITAL_COMMUNITY): Payer: Medicare Other

## 2017-07-03 DIAGNOSIS — E876 Hypokalemia: Secondary | ICD-10-CM

## 2017-07-03 DIAGNOSIS — M79609 Pain in unspecified limb: Secondary | ICD-10-CM

## 2017-07-03 DIAGNOSIS — E039 Hypothyroidism, unspecified: Secondary | ICD-10-CM

## 2017-07-03 LAB — GLUCOSE, CAPILLARY
GLUCOSE-CAPILLARY: 166 mg/dL — AB (ref 65–99)
GLUCOSE-CAPILLARY: 112 mg/dL — AB (ref 65–99)
Glucose-Capillary: 129 mg/dL — ABNORMAL HIGH (ref 65–99)
Glucose-Capillary: 362 mg/dL — ABNORMAL HIGH (ref 65–99)

## 2017-07-03 LAB — BASIC METABOLIC PANEL
ANION GAP: 10 (ref 5–15)
BUN: 19 mg/dL (ref 6–20)
CO2: 26 mmol/L (ref 22–32)
Calcium: 8.7 mg/dL — ABNORMAL LOW (ref 8.9–10.3)
Chloride: 103 mmol/L (ref 101–111)
Creatinine, Ser: 1.07 mg/dL — ABNORMAL HIGH (ref 0.44–1.00)
GFR calc Af Amer: 56 mL/min — ABNORMAL LOW (ref >60)
GFR, EST NON AFRICAN AMERICAN: 48 mL/min — AB (ref >60)
GLUCOSE: 146 mg/dL — AB (ref 65–99)
POTASSIUM: 3.4 mmol/L — AB (ref 3.5–5.1)
Sodium: 139 mmol/L (ref 135–145)

## 2017-07-03 LAB — URINE CULTURE

## 2017-07-03 LAB — T3, FREE: T3 FREE: 1.9 pg/mL — AB (ref 2.0–4.4)

## 2017-07-03 LAB — MAGNESIUM: Magnesium: 1.5 mg/dL — ABNORMAL LOW (ref 1.7–2.4)

## 2017-07-03 MED ORDER — LEVOTHYROXINE SODIUM 25 MCG PO TABS
25.0000 ug | ORAL_TABLET | Freq: Every day | ORAL | Status: DC
  Administered 2017-07-03 – 2017-07-05 (×3): 25 ug via ORAL
  Filled 2017-07-03 (×2): qty 1

## 2017-07-03 MED ORDER — AMIODARONE HCL 200 MG PO TABS
400.0000 mg | ORAL_TABLET | Freq: Two times a day (BID) | ORAL | Status: DC
  Administered 2017-07-03: 400 mg via ORAL
  Filled 2017-07-03: qty 2

## 2017-07-03 MED ORDER — VERAPAMIL HCL 2.5 MG/ML IV SOLN
5.0000 mg | Freq: Once | INTRAVENOUS | Status: DC
  Filled 2017-07-03: qty 2

## 2017-07-03 MED ORDER — AMIODARONE HCL IN DEXTROSE 360-4.14 MG/200ML-% IV SOLN
60.0000 mg/h | INTRAVENOUS | Status: DC
  Administered 2017-07-03: 60 mg/h via INTRAVENOUS
  Filled 2017-07-03: qty 200

## 2017-07-03 MED ORDER — POTASSIUM CHLORIDE CRYS ER 20 MEQ PO TBCR
20.0000 meq | EXTENDED_RELEASE_TABLET | Freq: Once | ORAL | Status: AC
  Administered 2017-07-03: 20 meq via ORAL
  Filled 2017-07-03: qty 1

## 2017-07-03 MED ORDER — POTASSIUM CHLORIDE CRYS ER 20 MEQ PO TBCR
30.0000 meq | EXTENDED_RELEASE_TABLET | Freq: Two times a day (BID) | ORAL | Status: DC
  Administered 2017-07-03 (×2): 30 meq via ORAL
  Filled 2017-07-03 (×2): qty 1

## 2017-07-03 MED ORDER — IOPAMIDOL (ISOVUE-370) INJECTION 76%
INTRAVENOUS | Status: AC
  Filled 2017-07-03: qty 100

## 2017-07-03 MED ORDER — ATORVASTATIN CALCIUM 10 MG PO TABS
10.0000 mg | ORAL_TABLET | Freq: Every day | ORAL | Status: DC
  Administered 2017-07-04 – 2017-07-05 (×2): 10 mg via ORAL
  Filled 2017-07-03 (×2): qty 1

## 2017-07-03 MED ORDER — AMIODARONE LOAD VIA INFUSION
150.0000 mg | Freq: Once | INTRAVENOUS | Status: AC
  Administered 2017-07-03: 150 mg via INTRAVENOUS
  Filled 2017-07-03: qty 83.34

## 2017-07-03 MED ORDER — AMIODARONE HCL IN DEXTROSE 360-4.14 MG/200ML-% IV SOLN
30.0000 mg/h | INTRAVENOUS | Status: DC
  Administered 2017-07-03 – 2017-07-04 (×3): 30 mg/h via INTRAVENOUS
  Filled 2017-07-03 (×2): qty 200

## 2017-07-03 MED ORDER — MAGNESIUM SULFATE 2 GM/50ML IV SOLN
2.0000 g | Freq: Once | INTRAVENOUS | Status: AC
  Administered 2017-07-03: 2 g via INTRAVENOUS
  Filled 2017-07-03: qty 50

## 2017-07-03 MED ORDER — DILTIAZEM HCL 60 MG PO TABS
60.0000 mg | ORAL_TABLET | Freq: Three times a day (TID) | ORAL | Status: DC
  Administered 2017-07-03 – 2017-07-04 (×4): 60 mg via ORAL
  Filled 2017-07-03 (×4): qty 1

## 2017-07-03 NOTE — Progress Notes (Signed)
Progress Note  Patient Name: Lydia Myers Date of Encounter: 07/03/2017  Primary Cardiologist: historically Dr. Caryl Comes  Subjective   Patient very upset this morning, wants to leave.  Tells me they are trying to force her to do a scan and she doesnt want the CT scan.  Inpatient Medications    Scheduled Meds: . amiodarone  150 mg Intravenous Once  . brinzolamide  1 drop Both Eyes BID  . fentaNYL (SUBLIMAZE) injection  100 mcg Intravenous Once  . furosemide  40 mg Intravenous Daily  . irbesartan  300 mg Oral Daily   And  . hydrochlorothiazide  25 mg Oral Daily  . insulin aspart  0-9 Units Subcutaneous TID WC  . latanoprost  1 drop Both Eyes QHS  . LORazepam  2-4 mg Intravenous Once  . mouth rinse  15 mL Mouth Rinse BID  . rivaroxaban  20 mg Oral Q supper  . simvastatin  20 mg Oral Daily  . verapamil  5 mg Intravenous Once   Continuous Infusions: . amiodarone     Followed by  . amiodarone    . cefTRIAXone (ROCEPHIN)  IV Stopped (07/02/17 1535)   PRN Meds: iopamidol, ondansetron (ZOFRAN) IV, white petrolatum   Vital Signs    Vitals:   07/02/17 2055 07/02/17 2330 07/03/17 0000 07/03/17 0342  BP: 136/78 133/72 129/76 133/70  Pulse: 76 (!) 161 66 75  Resp: 19 (!) 24 (!) 21 18  Temp:  98.2 F (36.8 C)  98.4 F (36.9 C)  TempSrc:  Oral  Oral  SpO2: 100% 99% 97% 97%  Weight:      Height:        Intake/Output Summary (Last 24 hours) at 07/03/17 0751 Last data filed at 07/03/17 0400  Gross per 24 hour  Intake            507.1 ml  Output             2800 ml  Net          -2292.9 ml   Filed Weights   07/01/17 2229 07/02/17 1237  Weight: 166 lb 3.6 oz (75.4 kg) 166 lb (75.3 kg)    Telemetry    In/out of SR rapid AFlutter this AM - Personally Reviewed  ECG    No new EKG - Personally Reviewed  Physical Exam   GEN: No acute physical distress.     Neck: No JVD Cardiac: RRR tachycardic, no murmurs, rubs, or gallops.  Respiratory: Clear to auscultation  bilaterally. GI: Soft, nontender, non-distended  MS: No edema; No deformity. Neuro:  No gross focal deficits are appreciated, she is AAO x3 this AM Psych: anxious and upset upon arrival, able to be calmed and was eventually rational   Labs    Chemistry Recent Labs Lab 07/01/17 1320 07/02/17 0238 07/03/17 0308  NA 141 140 139  K 4.0 4.0 3.4*  CL 107 105 103  CO2 25 20* 26  GLUCOSE 122* 219* 146*  BUN 19 16 19   CREATININE 1.04* 0.99 1.07*  CALCIUM 9.3 8.1* 8.7*  PROT 7.4  --   --   ALBUMIN 4.2  --   --   AST 31  --   --   ALT 54  --   --   ALKPHOS 82  --   --   BILITOT 0.7  --   --   GFRNONAA 50* 53* 48*  GFRAA 58* >60 56*  ANIONGAP 9 15 10  Hematology Recent Labs Lab 07/01/17 1318 07/01/17 1320 07/02/17 0238  WBC  --  9.7 12.2*  RBC  --  4.21 4.08  HGB 13.3 12.3 11.8*  HCT 39.0 37.7 36.7  MCV  --  89.5 90.0  MCH  --  29.2 28.9  MCHC  --  32.6 32.2  RDW  --  14.8 14.9  PLT  --  327 253    Cardiac EnzymesNo results for input(s): TROPONINI in the last 168 hours.  Recent Labs Lab 07/01/17 1317  TROPIPOC 0.02     BNPNo results for input(s): BNP, PROBNP in the last 168 hours.   DDimer  Recent Labs Lab 07/01/17 1320  DDIMER 0.65*     Radiology    Dg Chest Port 1 View Result Date: 07/01/2017 CLINICAL DATA:  Shortness of breath.  Atrial fibrillation. EXAM: PORTABLE CHEST 1 VIEW COMPARISON:  Portable chest dated 09/29/2010 and chest CT dated 11/09/2010. FINDINGS: Improved inspiration. No gross change in enlargement of the cardiac silhouette. Small amount of linear scarring or atelectasis at the left lung base. Otherwise, clear lungs. Diffuse osteopenia. Mild scoliosis. IMPRESSION: No acute abnormality.  Stable mild cardiomegaly. Electronically Signed   By: Claudie Revering M.D.   On: 07/01/2017 13:55    Cardiac Studies   07/03/17 TTE Study Conclusions - Left ventricle: The cavity size was normal. Wall thickness was   increased in a pattern of mild LVH.  Systolic function was normal.   The estimated ejection fraction was in the range of 55% to 60%.   Wall motion was normal; there were no regional wall motion   abnormalities. Features are consistent with a pseudonormal left   ventricular filling pattern, with concomitant abnormal relaxation   and increased filling pressure (grade 2 diastolic dysfunction). - Ventricular septum: The contour showed diastolic flattening. - Mitral valve: There was moderate regurgitation. - Left atrium: The atrium was mildly dilated. - Right ventricle: The cavity size was mildly dilated. Wall   thickness was normal. - Tricuspid valve: There was trivial regurgitation. - Pulmonary arteries: Systolic pressure was severely increased. PA   peak pressure: 81 mm Hg (S).  Patient Profile     76 y.o. female with a hx of AFlutter ablated in 2011, HTN, DM, HLD admitted with AFlutter RVR 1:1 conduction, vertigo and UTI, suspect fluid OL  Assessment & Plan    1. AFlutter w/RVR 1:1     EKG is difficult to determine if typical/atypical, suspect typical     Xarelto started      recurrent AFlutter RVR overnight and this AM, rates 170's 1:1     Resume IV amiodarone this morning      2. Fluid OL     she is able to lay down further this morning more comfortably and without SOB and is happy about that, sats on O2 look OK     Fluid neg 2522ml     BMET noted, replace K+  2. Vertigo     Not active or ongoing c/o at this time, remains resolved     Patient last night was somewhat paranoid and aggressive with staff, threatening to call 911 trying to "force her to do tests"     She had been unable to tolerate MRI upon admit     Spoke with the patient this morning, she is AAO x3, and tells me as long as Dr. Rayann Heman and I are OK with what the other doctors are doing she would be agreeable.  Mentions she is  distrustful of doctors generally     Defer neuro eval/management to medicine team  3. HTN     Looks OK  4. UTI     As  per medicine team  5. Elevated D. Dimer     SOB sounds orthoppnic and ordered diuresed     Severely elevated PA pressure by echo     D/w Dr. Rayann Heman     Will defer w/u to r/o PE to medicine service     Patient refused CT scan   Signed, Baldwin Jamaica, PA-C  07/03/2017, 7:51 AM     I have seen, examined the patient, and reviewed the above assessment and plan.  Changes to above are made where necessary.  On exam, iRRR and quite tachycardic.  She was agitated.  Upon consolation, her heart rates decreased to 110s.  Will add diltiazem (oral) and also try to transition amiodarone to PO.  She is very clear in her decision to decline MRI and CT scans at this time.  We will therefore treat her medically according to her wishes.  Co Sign: Thompson Grayer, MD 07/03/2017 12:41 PM

## 2017-07-03 NOTE — Progress Notes (Signed)
PROGRESS NOTE    Lydia Myers  OYD:[id number] DOB: 1941-06-21 DOA: 07/01/2017 PCP: Kelton Pillar, MD   Brief Narrative:  76 y.o.BF PMHx A Flutter in the past s/p ablation in 2011 with Dr. Caryl Comes, HTN, COPD, DM type 2 controlled, HLD;   Presents with two-week history of dizziness. She states that the room spins around her, causing her to sit down. She has not passed out from this. She was diagnosed with vertigo by her primary care physician and was prescribed meclizine. Despite taking medication, she continued to be very dizzy. Today, she also noted heart palpitations like "the heart was trying to feed the brain." She denies any fevers or chills, no chest pain or pressure, denies any shortness of breath. No previous nausea or vomiting, no abdominal pain. She does also admit to dysuria. No swelling of her extremities.     Subjective: 8/20 9A/O 4, negative CP, positive SOB, negative N/V, negative abdominal pain. States REFUSES TO HAVE BRAIN MRI OR CTA    Assessment & Plan:   Active Problems:   Atrial fibrillation with rapid ventricular response (HCC)   Atrial fibrillation with RVR (HCC)  Chronic diastolic CHF -Strict in and out since admission -2.2 L -Daily weight Filed Weights   07/01/17 2229 07/02/17 1237  Weight: 166 lb 3.6 oz (75.4 kg) 166 lb (75.3 kg)  -See SVT  Severe Pulmonary HTN -See SVT   SVT and underlying A Flutter/Fib With RVR (CHADSVASc>5) -Hx of A Flutter s/p ablation by Dr. Caryl Comes in 2011 (report under media tab)  - 8/29 Restart IV Amiodarone per cardiology. Per Dr. Thompson Grayer, will try to transition patient back on to PO later today -Losartan 300 mg daily -HCTZ 25 mg daily  -Metoprolol IV PRN  -Xarelto  Essential HTN -See SVT  Hypothyroidism (subacute?) -TSH 6.6 -Free T3 low/free T4 low normal -Given given patient's A. fib/flutter, start Synthroid 25 g -Recheck TSH in 6-8 weeks   Dizziness  -Resolved most likely secondary to a flutter but  given patient's risk factors obtain MRI -PATIENT REFUSES BRAIN MRI. Discussed with patient at length the need for brain MRI to determine if she has had a stroke. Advised patient that if she has a stroke and it is undetected/untreated at greater risk of future stroke/DEATH. Patient states she understands and still refuses test.   Elevated d dimer -Patient anticoagulated for A. fib/flutter which will cover patient if she has PE -Discussed with patient at length need to obtain CTA chest  to R/O PE, patient understands the risks of undiagnosed PE to include extension of current PE, increased risk for future PE, increased risk for death. Patient refuses exam.   positive Klebsiella pneumoniae UTI  -Complete five-day course antibiotics, will transition to PO in the A.m. if patient remains afebrile.    DM type 2 controlled with complication -Hold home PO meds -8/27 Hemoglobin A1c=6.2   -Sensitive SSI   HLD -Lipid panel within ADA guidelines - Zocor 20 mg daily  Hypokalemia -Potassium goal> 4 -k-Dur 50 mEq  Hypomagnesemia -Magnesium goal> 2 -Magnesium IV 2 g    DVT prophylaxis: Xarelto Code Status: DO NOT RESUSCITATE Family Communication: Family present discussion of plan of care Disposition Plan: TBD   Consultants:  Cardiology Dr. Thompson Grayer     Procedures/Significant Events:  Echocardiogram:Left ventricle: mild LVH. Systolic function was normal. -LVEF=   55% to 60%. -Grade 2 diastolic dysfunction  -Mitral valve: moderate regurgitation. - Pulmonary arteries: PA  peak pressure: 81 mm Hg (S).  I have personally reviewed and interpreted all radiology studies and my findings are as above.  VENTILATOR SETTINGS: None   Cultures 8/27 positive Klebsiella pneumoniae 8/28 blood pending      Antimicrobials: Anti-infectives    Start     Stop   07/02/17 1500  cefTRIAXone (ROCEPHIN) 1 g in dextrose 5 % 50 mL IVPB         07/02/17 0000  cefTRIAXone (ROCEPHIN) 1 g in  dextrose 5 % 50 mL IVPB  Status:  Discontinued     07/01/17 2331   07/01/17 1400  cefTRIAXone (ROCEPHIN) 1 g in dextrose 5 % 50 mL IVPB     07/01/17 1514       Devices    LINES / TUBES:      Continuous Infusions: . amiodarone 60 mg/hr (07/03/17 0800)   Followed by  . amiodarone    . cefTRIAXone (ROCEPHIN)  IV Stopped (07/02/17 1535)     Objective: Vitals:   07/02/17 2330 07/03/17 0000 07/03/17 0342 07/03/17 0800  BP: 133/72 129/76 133/70 127/70  Pulse: (!) 161 66 75 69  Resp: (!) 24 (!) 21 18 19   Temp: 98.2 F (36.8 C)  98.4 F (36.9 C) 97.7 F (36.5 C)  TempSrc: Oral  Oral Oral  SpO2: 99% 97% 97% 95%  Weight:      Height:        Intake/Output Summary (Last 24 hours) at 07/03/17 0947 Last data filed at 07/03/17 0900  Gross per 24 hour  Intake            627.1 ml  Output             2800 ml  Net          -2172.9 ml   Filed Weights   07/01/17 2229 07/02/17 1237  Weight: 166 lb 3.6 oz (75.4 kg) 166 lb (75.3 kg)    Examination:  General: A/O 4, positive acute respiratory distress  Neck:  Negative scars, masses, torticollis, lymphadenopathy, JVD  Lungs: clear to auscultation bilaterally without wheezes or crackles  Cardiovascular: Irregular irregular rhythm and rate, negative murmurs rubs or gallops, normal S1/S2  Abdomen: Negative abdominal pain, negative distention, positive bowel sounds, no appreciable mass, no ascites  Extremities: No significant cyanosis, clubbing. +2 pedal edema bilateral  Psychiatric:  Negative depression, positive anxiety, negative fatigue, negative mania  Central nervous system:  Cranial nerves II through XII intact, tongue/uvula midline, all extremity muscle strength 5/5, sensation intact throughout, negative dysarthria, negative expressive aphasia, negative receptive aphasia. Did not ambulate patient  .     Data Reviewed: Care during the described time interval was provided by me .  I have reviewed this patient's available  data, including medical history, events of note, physical examination, and all test results as part of my evaluation.   CBC:  Recent Labs Lab 07/01/17 1318 07/01/17 1320 07/02/17 0238  WBC  --  9.7 12.2*  NEUTROABS  --  7.4  --   HGB 13.3 12.3 11.8*  HCT 39.0 37.7 36.7  MCV  --  89.5 90.0  PLT  --  327 063   Basic Metabolic Panel:  Recent Labs Lab 07/01/17 1318 07/01/17 1320 07/02/17 0238 07/03/17 0308  NA 143 141 140 139  K 3.9 4.0 4.0 3.4*  CL 104 107 105 103  CO2  --  25 20* 26  GLUCOSE 118* 122* 219* 146*  BUN 19 19 16 19   CREATININE 0.90 1.04* 0.99 1.07*  CALCIUM  --  9.3 8.1* 8.7*  MG  --   --   --  1.5*   GFR: Estimated Creatinine Clearance: 40.2 mL/min (A) (by C-G formula based on SCr of 1.07 mg/dL (H)). Liver Function Tests:  Recent Labs Lab 07/01/17 1320  AST 31  ALT 54  ALKPHOS 82  BILITOT 0.7  PROT 7.4  ALBUMIN 4.2   No results for input(s): LIPASE, AMYLASE in the last 168 hours. No results for input(s): AMMONIA in the last 168 hours. Coagulation Profile:  Recent Labs Lab 07/01/17 1320  INR 1.16   Cardiac Enzymes: No results for input(s): CKTOTAL, CKMB, CKMBINDEX, TROPONINI in the last 168 hours. BNP (last 3 results) No results for input(s): PROBNP in the last 8760 hours. HbA1C:  Recent Labs  07/01/17 2345  HGBA1C 6.2*   CBG:  Recent Labs Lab 07/02/17 0847 07/02/17 1241 07/02/17 1611 07/02/17 2113 07/03/17 0828  GLUCAP 128* 133* 142* 156* 166*   Lipid Profile:  Recent Labs  07/02/17 0238  CHOL 146  HDL 67  LDLCALC 69  TRIG 52  CHOLHDL 2.2   Thyroid Function Tests:  Recent Labs  07/02/17 1515 07/02/17 1817  TSH 6.660*  --   FREET4  --  1.01  T3FREE  --  1.9*   Anemia Panel: No results for input(s): VITAMINB12, FOLATE, FERRITIN, TIBC, IRON, RETICCTPCT in the last 72 hours. Urine analysis:    Component Value Date/Time   COLORURINE YELLOW 07/01/2017 1259   APPEARANCEUR HAZY (A) 07/01/2017 1259   LABSPEC  1.015 07/01/2017 1259   PHURINE 6.0 07/01/2017 1259   GLUCOSEU NEGATIVE 07/01/2017 1259   HGBUR SMALL (A) 07/01/2017 1259   BILIRUBINUR NEGATIVE 07/01/2017 1259   KETONESUR NEGATIVE 07/01/2017 1259   PROTEINUR 100 (A) 07/01/2017 1259   UROBILINOGEN 1.0 09/30/2010 1812   NITRITE POSITIVE (A) 07/01/2017 1259   LEUKOCYTESUR MODERATE (A) 07/01/2017 1259   Sepsis Labs: @LABRCNTIP (procalcitonin:4,lacticidven:4)  ) Recent Results (from the past 240 hour(s))  Urine culture     Status: Abnormal   Collection Time: 07/01/17 12:59 PM  Result Value Ref Range Status   Specimen Description URINE, RANDOM  Final   Special Requests NONE  Final   Culture >=100,000 COLONIES/mL KLEBSIELLA PNEUMONIAE (A)  Final   Report Status 07/03/2017 FINAL  Final   Organism ID, Bacteria KLEBSIELLA PNEUMONIAE (A)  Final      Susceptibility   Klebsiella pneumoniae - MIC*    AMPICILLIN >=32 RESISTANT Resistant     CEFAZOLIN <=4 SENSITIVE Sensitive     CEFTRIAXONE <=1 SENSITIVE Sensitive     CIPROFLOXACIN >=4 RESISTANT Resistant     GENTAMICIN <=1 SENSITIVE Sensitive     IMIPENEM 0.5 SENSITIVE Sensitive     NITROFURANTOIN 128 RESISTANT Resistant     TRIMETH/SULFA <=20 SENSITIVE Sensitive     AMPICILLIN/SULBACTAM 8 SENSITIVE Sensitive     PIP/TAZO 8 SENSITIVE Sensitive     Extended ESBL NEGATIVE Sensitive     * >=100,000 COLONIES/mL KLEBSIELLA PNEUMONIAE  MRSA PCR Screening     Status: None   Collection Time: 07/01/17 11:35 PM  Result Value Ref Range Status   MRSA by PCR NEGATIVE NEGATIVE Final    Comment:        The GeneXpert MRSA Assay (FDA approved for NASAL specimens only), is one component of a comprehensive MRSA colonization surveillance program. It is not intended to diagnose MRSA infection nor to guide or monitor treatment for MRSA infections.          Radiology Studies: Dg Chest  Port 1 View  Result Date: 07/01/2017 CLINICAL DATA:  Shortness of breath.  Atrial fibrillation. EXAM:  PORTABLE CHEST 1 VIEW COMPARISON:  Portable chest dated 09/29/2010 and chest CT dated 11/09/2010. FINDINGS: Improved inspiration. No gross change in enlargement of the cardiac silhouette. Small amount of linear scarring or atelectasis at the left lung base. Otherwise, clear lungs. Diffuse osteopenia. Mild scoliosis. IMPRESSION: No acute abnormality.  Stable mild cardiomegaly. Electronically Signed   By: Claudie Revering M.D.   On: 07/01/2017 13:55        Scheduled Meds: . brinzolamide  1 drop Both Eyes BID  . fentaNYL (SUBLIMAZE) injection  100 mcg Intravenous Once  . furosemide  40 mg Intravenous Daily  . irbesartan  300 mg Oral Daily   And  . hydrochlorothiazide  25 mg Oral Daily  . insulin aspart  0-9 Units Subcutaneous TID WC  . latanoprost  1 drop Both Eyes QHS  . LORazepam  2-4 mg Intravenous Once  . mouth rinse  15 mL Mouth Rinse BID  . potassium chloride  30 mEq Oral BID  . rivaroxaban  20 mg Oral Q supper  . simvastatin  20 mg Oral Daily  . verapamil  5 mg Intravenous Once   Continuous Infusions: . amiodarone 60 mg/hr (07/03/17 0800)   Followed by  . amiodarone    . cefTRIAXone (ROCEPHIN)  IV Stopped (07/02/17 1535)     LOS: 2 days    Time spent: 40 minutes    Yannis Gumbs, Geraldo Docker, MD [facility] Pager [phone]   If 7PM-7AM, please contact night-coverage [email or web address] Password [facility] 07/03/2017, 9:47 AM

## 2017-07-03 NOTE — Progress Notes (Signed)
*  PRELIMINARY RESULTS* Vascular Ultrasound Bilateral lower extremity venous duplex has been completed.  Preliminary findings: No evidence of deep vein thrombosis or baker's cysts bilaterally.   Lydia Myers 07/03/2017, 2:19 PM

## 2017-07-03 NOTE — Care Management Note (Signed)
Case Management Note  Patient Details  Name: Lydia Myers MRN: [id number] Date of Birth: Sep 17, 1941  Subjective/Objective:     Pt admitted with A fib with RVR               Action/Plan:   PTA independent from home.  CM provided free 30 day Xarelto card and informed pt of copay.  Pt informed CM that preferred pharmacy is [facility] [address] can fill prescription as prescribed.   Expected Discharge Date:  07/03/17               Expected Discharge Plan:  Home/Self Care  In-House Referral:     Discharge planning Services  CM Consult  Post Acute Care Choice:    Choice offered to:     DME Arranged:    DME Agency:     HH Arranged:    HH Agency:     Status of Service:     If discussed at [employer], dates discussed:    Additional Comments: 1. XARELTO  20 MG BID   COVER- YES  CO-PAY-$ 45.00  TIER- 3 DRUG  PRIOR APPROVAL- NO   2 . XARELTO 15 MG BID   COVER- YES  CO-PAY- $ 45.00  TIER - 3 DRUG  PRIOR APPROVAL- NO  Maryclare Labrador, RN 07/03/2017, 9:41 AM

## 2017-07-03 NOTE — Progress Notes (Signed)
Patient arrived on the unit from [facility] on a hospital bed, vital signs obtained see flowsheet, placed on tele, patient oriented to room and staff, bed in lowest position , call bell within reach will continue to monitor.

## 2017-07-04 DIAGNOSIS — R7989 Other specified abnormal findings of blood chemistry: Secondary | ICD-10-CM

## 2017-07-04 DIAGNOSIS — R42 Dizziness and giddiness: Secondary | ICD-10-CM

## 2017-07-04 DIAGNOSIS — A498 Other bacterial infections of unspecified site: Secondary | ICD-10-CM

## 2017-07-04 DIAGNOSIS — I5031 Acute diastolic (congestive) heart failure: Secondary | ICD-10-CM

## 2017-07-04 DIAGNOSIS — E039 Hypothyroidism, unspecified: Secondary | ICD-10-CM

## 2017-07-04 DIAGNOSIS — B961 Klebsiella pneumoniae [K. pneumoniae] as the cause of diseases classified elsewhere: Secondary | ICD-10-CM

## 2017-07-04 LAB — CBC
HCT: 37.6 % (ref 36.0–46.0)
HEMOGLOBIN: 11.9 g/dL — AB (ref 12.0–15.0)
MCH: 28.3 pg (ref 26.0–34.0)
MCHC: 31.6 g/dL (ref 30.0–36.0)
MCV: 89.5 fL (ref 78.0–100.0)
Platelets: 331 10*3/uL (ref 150–400)
RBC: 4.2 MIL/uL (ref 3.87–5.11)
RDW: 14.5 % (ref 11.5–15.5)
WBC: 10.1 10*3/uL (ref 4.0–10.5)

## 2017-07-04 LAB — URINALYSIS, ROUTINE W REFLEX MICROSCOPIC
Bilirubin Urine: NEGATIVE
GLUCOSE, UA: NEGATIVE mg/dL
Ketones, ur: NEGATIVE mg/dL
Leukocytes, UA: NEGATIVE
Nitrite: NEGATIVE
Protein, ur: 100 mg/dL — AB
SPECIFIC GRAVITY, URINE: 1.016 (ref 1.005–1.030)
Squamous Epithelial / LPF: NONE SEEN
pH: 5 (ref 5.0–8.0)

## 2017-07-04 LAB — BASIC METABOLIC PANEL
Anion gap: 11 (ref 5–15)
BUN: 25 mg/dL — ABNORMAL HIGH (ref 6–20)
CALCIUM: 8.9 mg/dL (ref 8.9–10.3)
CO2: 24 mmol/L (ref 22–32)
CREATININE: 1 mg/dL (ref 0.44–1.00)
Chloride: 104 mmol/L (ref 101–111)
GFR calc non Af Amer: 53 mL/min — ABNORMAL LOW (ref >60)
Glucose, Bld: 129 mg/dL — ABNORMAL HIGH (ref 65–99)
Potassium: 3.9 mmol/L (ref 3.5–5.1)
SODIUM: 139 mmol/L (ref 135–145)

## 2017-07-04 LAB — HEMOGLOBIN AND HEMATOCRIT, BLOOD
HCT: 37.3 % (ref 36.0–46.0)
Hemoglobin: 12 g/dL (ref 12.0–15.0)

## 2017-07-04 LAB — GLUCOSE, CAPILLARY
GLUCOSE-CAPILLARY: 129 mg/dL — AB (ref 65–99)
GLUCOSE-CAPILLARY: 244 mg/dL — AB (ref 65–99)
GLUCOSE-CAPILLARY: 173 mg/dL — AB (ref 65–99)
Glucose-Capillary: 175 mg/dL — ABNORMAL HIGH (ref 65–99)

## 2017-07-04 LAB — MAGNESIUM: MAGNESIUM: 2.1 mg/dL (ref 1.7–2.4)

## 2017-07-04 MED ORDER — FUROSEMIDE 20 MG PO TABS
20.0000 mg | ORAL_TABLET | Freq: Every day | ORAL | Status: DC
  Administered 2017-07-04 – 2017-07-05 (×2): 20 mg via ORAL
  Filled 2017-07-04 (×2): qty 1

## 2017-07-04 MED ORDER — POTASSIUM CHLORIDE CRYS ER 10 MEQ PO TBCR
10.0000 meq | EXTENDED_RELEASE_TABLET | Freq: Every day | ORAL | Status: DC
Start: 2017-07-04 — End: 2017-07-05
  Administered 2017-07-04 – 2017-07-05 (×2): 10 meq via ORAL
  Filled 2017-07-04 (×2): qty 1

## 2017-07-04 MED ORDER — DILTIAZEM HCL ER COATED BEADS 240 MG PO CP24
240.0000 mg | ORAL_CAPSULE | Freq: Every day | ORAL | Status: DC
  Administered 2017-07-04 – 2017-07-05 (×2): 240 mg via ORAL
  Filled 2017-07-04 (×2): qty 1

## 2017-07-04 MED ORDER — AMIODARONE HCL 200 MG PO TABS
200.0000 mg | ORAL_TABLET | Freq: Two times a day (BID) | ORAL | Status: DC
  Administered 2017-07-04 – 2017-07-05 (×3): 200 mg via ORAL
  Filled 2017-07-04 (×3): qty 1

## 2017-07-04 NOTE — Progress Notes (Signed)
PROGRESS NOTE    Lydia Myers  QIW:[id number] DOB: February 27, 1941 DOA: 07/01/2017 PCP: Kelton Pillar, MD   Brief Narrative:  76 y.o.BF PMHx A Flutter in the past s/p ablation in 2011 with Dr. Caryl Comes, HTN, COPD, DM type 2 controlled, HLD and other comorbids who presented with two-week history of dizziness. She states that the room spins around her, causing her to sit down. She has not passed out from this. She was diagnosed with vertigo by her primary care physician and was prescribed meclizine. Despite taking medication, she continuedto be very dizzy. Today, she also noted heart palpitations like "the heart was trying to feed the brain." She denies any fevers or chills, no chest pain or pressure, denies any shortness of breath. No previous nausea or vomiting, no abdominal pain. She does also admit to dysuria. Was admitted and found to have SVT with Underlying A Fib/Flutter and Cardiology evaluated and are adjusting medications.   Assessment & Plan:   Active Problems:   Atrial fibrillation with rapid ventricular response (HCC)   Atrial fibrillation with RVR (HCC)  Chronic diastolic CHF -Strict in and out since admission -3.0 L -IV Diuresis changed to po by Cardiology; C/w Furosemide 20 mg po Daily -Daily weights  Severe Pulmonary HTN -See below -C/w Diuresis and change to po   SVT and underlying A Flutter/Fib With RVR (CHADSVASc>5) -Hx of A Flutter s/p ablation by Dr. Caryl Comes in 2011 (report under media tab)  -IV Amiodarone changed to po Amiodarone 200 mg po BID -C/w Diltiazem 240 mg po Daily -Losartan 300 mg daily -HCTZ 25 mg daily -Metoprolol IV PRN  -C/w Xarelto 20 mg  Essential HTN -C/w Diltiazem 240 mg po Daily -Losartan 300 mg daily -HCTZ 25 mg daily  -Metoprolol IV PRN   Hypothyroidism (subacute?) -TSH 6.6 -Free T3 low at 1.9 /free T4 low normal -Given given patient's A. fib/flutter, stareted Synthroid 25 g -Recheck TSH in 6-8 weeks  Dizziness  -Resolved.    -Most likely secondary to a flutter but given patient's risk factors obtain MRI -PATIENT REFUSES BRAIN MRI. Discussed with patient at length the need for brain MRI to determine if she has had a stroke. Advised patient that if she has a stroke and it is undetected/untreated at greater risk of future stroke/DEATH. Patient states she understands and still refuses test.  Elevated D-Dimer -Patient anticoagulated for A. fib/flutter which will cover patient if she has PE -Lower Extremity Venous Duplex showed No evidence of deep vein thrombosis involving the visualized veins of the right lower extremity. No evidence of deep vein thrombosis involving the visualized veins of the left lower extremity. No evidence of Baker&'s cyst on the right or left. -Discussed with patient at length need to obtain CTA chest  to R/O PE, patient understands the risks of undiagnosed PE to include extension of current PE, increased risk for future PE, increased risk for death. Patient refuses exam.  Positive Klebsiella pneumoniae UTI  -Complete five-day course Antibiotics with IV Ceftriaxone  DM Type 2 controlled with complication -Hold home PO meds -8/27 Hemoglobin A1c=6.2   -Sensitive SSI  HLD -Lipid panel within ADA guidelines -C/w Atorvastatin 10 mg po qHS  Hypokalemia -Potassium goal> 4 -K-Dur 10 mEq  Hypomagnesemia -Magnesium goal> 2 -Magnesium IV 2 g    Glaucoma  -C/w Brinzolamide and Latanoprost  DVT prophylaxis: Anticoagulated with Rivaroxaban Code Status: FULL CODE Family Communication: No family at bedside Disposition Plan: Pending PT Eval   Consultants:   Cardiology   Procedures:  None   Antimicrobials:  Anti-infectives    Start     Dose/Rate Route Frequency Ordered Stop   07/02/17 1500  cefTRIAXone (ROCEPHIN) 1 g in dextrose 5 % 50 mL IVPB     1 g 100 mL/hr over 30 Minutes Intravenous Every 24 hours 07/01/17 2331     07/02/17 0000  cefTRIAXone (ROCEPHIN) 1 g in dextrose 5 % 50  mL IVPB  Status:  Discontinued     1 g 100 mL/hr over 30 Minutes Intravenous Every 24 hours 07/01/17 2225 07/01/17 2331   07/01/17 1400  cefTRIAXone (ROCEPHIN) 1 g in dextrose 5 % 50 mL IVPB     1 g 100 mL/hr over 30 Minutes Intravenous  Once 07/01/17 1349 07/01/17 1514     Subjective: Seen and examined and felt better. Complained that she did not want to do imaging studies. No other complaints or concerns.   Objective: Vitals:   07/04/17 1800 07/04/17 1900 07/04/17 2000 07/04/17 2058  BP:    (!) 95/54  Pulse: 72 77 (!) 42 88  Resp: (!) 26 (!) 30 (!) 26 (!) 23  Temp:    98.3 F (36.8 C)  TempSrc:      SpO2: (!) 88% 92% 91% (!) 88%  Weight:      Height:        Intake/Output Summary (Last 24 hours) at 07/04/17 2127 Last data filed at 07/04/17 1610  Gross per 24 hour  Intake           163.66 ml  Output             1000 ml  Net          -836.34 ml   Filed Weights   07/01/17 2229 07/02/17 1237 07/04/17 0400  Weight: 75.4 kg (166 lb 3.6 oz) 75.3 kg (166 lb) 71.8 kg (158 lb 3.2 oz)   Examination: Physical Exam:  Constitutional: WN/WD, NAD and appears calm and comfortable Eyes: Lids and conjunctivae normal, sclerae anicteric  ENMT: External Ears, Nose appear normal. Grossly normal hearing. Mucous membranes are moist.  Neck: Appears normal, supple, no cervical masses, normal ROM, no appreciable thyromegaly, no JVD Respiratory: Clear to auscultation bilaterally, no wheezing, rales, rhonchi or crackles. Normal respiratory effort and patient is not tachypenic. No accessory muscle use.  Cardiovascular: RRR, no murmurs / rubs / gallops. S1 and S2 auscultated. Mild extremity edema.  Abdomen: Soft, non-tender, non-distended. No masses palpated. No appreciable hepatosplenomegaly. Bowel sounds positive x4.  GU: Deferred. Musculoskeletal: No clubbing / cyanosis of digits/nails. No joint deformity upper and lower extremities.  Skin: No rashes, lesions, ulcers on a limited skin exam. No  induration; Warm and dry.  Neurologic: CN 2-12 grossly intact with no focal deficits. Romberg sign cerebellar reflexes not assessed.  Psychiatric: Normal judgment and insight. Alert and oriented x 3. Normal mood and appropriate affect.   Data Reviewed: I have personally reviewed following labs and imaging studies  CBC:  Recent Labs Lab 07/01/17 1318 07/01/17 1320 07/02/17 0238 07/04/17 0305 07/04/17 1032  WBC  --  9.7 12.2* 10.1  --   NEUTROABS  --  7.4  --   --   --   HGB 13.3 12.3 11.8* 11.9* 12.0  HCT 39.0 37.7 36.7 37.6 37.3  MCV  --  89.5 90.0 89.5  --   PLT  --  327 253 331  --    Basic Metabolic Panel:  Recent Labs Lab 07/01/17 1318 07/01/17 1320 07/02/17 0238 07/03/17 0308  07/04/17 0305  NA 143 141 140 139 139  K 3.9 4.0 4.0 3.4* 3.9  CL 104 107 105 103 104  CO2  --  25 20* 26 24  GLUCOSE 118* 122* 219* 146* 129*  BUN 19 19 16 19  25*  CREATININE 0.90 1.04* 0.99 1.07* 1.00  CALCIUM  --  9.3 8.1* 8.7* 8.9  MG  --   --   --  1.5* 2.1   GFR: Estimated Creatinine Clearance: 42 mL/min (by C-G formula based on SCr of 1 mg/dL). Liver Function Tests:  Recent Labs Lab 07/01/17 1320  AST 31  ALT 54  ALKPHOS 82  BILITOT 0.7  PROT 7.4  ALBUMIN 4.2   No results for input(s): LIPASE, AMYLASE in the last 168 hours. No results for input(s): AMMONIA in the last 168 hours. Coagulation Profile:  Recent Labs Lab 07/01/17 1320  INR 1.16   Cardiac Enzymes: No results for input(s): CKTOTAL, CKMB, CKMBINDEX, TROPONINI in the last 168 hours. BNP (last 3 results) No results for input(s): PROBNP in the last 8760 hours. HbA1C:  Recent Labs  07/01/17 2345  HGBA1C 6.2*   CBG:  Recent Labs Lab 07/03/17 2056 07/04/17 0618 07/04/17 1108 07/04/17 1614 07/04/17 2057  GLUCAP 112* 129* 244* 173* 175*   Lipid Profile:  Recent Labs  07/02/17 0238  CHOL 146  HDL 67  LDLCALC 69  TRIG 52  CHOLHDL 2.2   Thyroid Function Tests:  Recent Labs   07/02/17 1515 07/02/17 1817  TSH 6.660*  --   FREET4  --  1.01  T3FREE  --  1.9*   Anemia Panel: No results for input(s): VITAMINB12, FOLATE, FERRITIN, TIBC, IRON, RETICCTPCT in the last 72 hours. Sepsis Labs: No results for input(s): PROCALCITON, LATICACIDVEN in the last 168 hours.  Recent Results (from the past 240 hour(s))  Urine culture     Status: Abnormal   Collection Time: 07/01/17 12:59 PM  Result Value Ref Range Status   Specimen Description URINE, RANDOM  Final   Special Requests NONE  Final   Culture >=100,000 COLONIES/mL KLEBSIELLA PNEUMONIAE (A)  Final   Report Status 07/03/2017 FINAL  Final   Organism ID, Bacteria KLEBSIELLA PNEUMONIAE (A)  Final      Susceptibility   Klebsiella pneumoniae - MIC*    AMPICILLIN >=32 RESISTANT Resistant     CEFAZOLIN <=4 SENSITIVE Sensitive     CEFTRIAXONE <=1 SENSITIVE Sensitive     CIPROFLOXACIN >=4 RESISTANT Resistant     GENTAMICIN <=1 SENSITIVE Sensitive     IMIPENEM 0.5 SENSITIVE Sensitive     NITROFURANTOIN 128 RESISTANT Resistant     TRIMETH/SULFA <=20 SENSITIVE Sensitive     AMPICILLIN/SULBACTAM 8 SENSITIVE Sensitive     PIP/TAZO 8 SENSITIVE Sensitive     Extended ESBL NEGATIVE Sensitive     * >=100,000 COLONIES/mL KLEBSIELLA PNEUMONIAE  MRSA PCR Screening     Status: None   Collection Time: 07/01/17 11:35 PM  Result Value Ref Range Status   MRSA by PCR NEGATIVE NEGATIVE Final    Comment:        The GeneXpert MRSA Assay (FDA approved for NASAL specimens only), is one component of a comprehensive MRSA colonization surveillance program. It is not intended to diagnose MRSA infection nor to guide or monitor treatment for MRSA infections.   Culture, blood (routine x 2)     Status: None (Preliminary result)   Collection Time: 07/02/17  7:15 PM  Result Value Ref Range Status   Specimen Description  BLOOD LEFT HAND  Final   Special Requests IN PEDIATRIC BOTTLE Blood Culture adequate volume  Final   Culture NO GROWTH  2 DAYS  Final   Report Status PENDING  Incomplete  Culture, blood (routine x 2)     Status: None (Preliminary result)   Collection Time: 07/02/17  7:22 PM  Result Value Ref Range Status   Specimen Description BLOOD RIGHT HAND  Final   Special Requests   Final    BOTTLES DRAWN AEROBIC AND ANAEROBIC Blood Culture adequate volume   Culture NO GROWTH 2 DAYS  Final   Report Status PENDING  Incomplete    Radiology Studies: No results found.  Scheduled Meds: . amiodarone  200 mg Oral BID  . atorvastatin  10 mg Oral q1800  . brinzolamide  1 drop Both Eyes BID  . diltiazem  240 mg Oral Daily  . fentaNYL (SUBLIMAZE) injection  100 mcg Intravenous Once  . furosemide  20 mg Oral Daily  . irbesartan  300 mg Oral Daily   And  . hydrochlorothiazide  25 mg Oral Daily  . insulin aspart  0-9 Units Subcutaneous TID WC  . latanoprost  1 drop Both Eyes QHS  . levothyroxine  25 mcg Oral QAC breakfast  . mouth rinse  15 mL Mouth Rinse BID  . potassium chloride  10 mEq Oral Daily  . rivaroxaban  20 mg Oral Q supper  . verapamil  5 mg Intravenous Once   Continuous Infusions: . cefTRIAXone (ROCEPHIN)  IV Stopped (07/04/17 1504)    LOS: 3 days   Kerney Elbe, DO [facility] Pager [phone]  If 7PM-7AM, please contact night-coverage [email or web address] Password [facility] 07/04/2017, 9:27 PM

## 2017-07-04 NOTE — Progress Notes (Signed)
Progress Note  Patient Name: Lydia Myers Date of Encounter: 07/04/2017  Primary Cardiologist: historically Dr. Caryl Comes  Subjective   Patient is much more reasonable this morning, but remains unhappy with care overall and glad no one is forcing scans on her.  No CP, palpitations, SOB is resolved at least at rest.    Inpatient Medications    Scheduled Meds: . atorvastatin  10 mg Oral q1800  . brinzolamide  1 drop Both Eyes BID  . diltiazem  60 mg Oral Q8H  . fentaNYL (SUBLIMAZE) injection  100 mcg Intravenous Once  . furosemide  40 mg Intravenous Daily  . irbesartan  300 mg Oral Daily   And  . hydrochlorothiazide  25 mg Oral Daily  . insulin aspart  0-9 Units Subcutaneous TID WC  . latanoprost  1 drop Both Eyes QHS  . levothyroxine  25 mcg Oral QAC breakfast  . mouth rinse  15 mL Mouth Rinse BID  . potassium chloride  30 mEq Oral BID  . rivaroxaban  20 mg Oral Q supper  . verapamil  5 mg Intravenous Once   Continuous Infusions: . amiodarone 30 mg/hr (07/04/17 0446)  . cefTRIAXone (ROCEPHIN)  IV Stopped (07/03/17 1640)   PRN Meds: iopamidol, ondansetron (ZOFRAN) IV, white petrolatum   Vital Signs    Vitals:   07/04/17 0300 07/04/17 0400 07/04/17 0500 07/04/17 0810  BP:  (!) 102/53  (!) 99/54  Pulse: 89 92 84 96  Resp: (!) 30 15 18 17   Temp:  98 F (36.7 C)  97.7 F (36.5 C)  TempSrc:  Oral  Oral  SpO2: 96% 96% 97% 96%  Weight:  158 lb 3.2 oz (71.8 kg)    Height:        Intake/Output Summary (Last 24 hours) at 07/04/17 0943 Last data filed at 07/04/17 0629  Gross per 24 hour  Intake           163.66 ml  Output             1000 ml  Net          -836.34 ml   Filed Weights   07/01/17 2229 07/02/17 1237 07/04/17 0400  Weight: 166 lb 3.6 oz (75.4 kg) 166 lb (75.3 kg) 158 lb 3.2 oz (71.8 kg)    Telemetry    Remained with AFlutter FVR until this AM, SR 60's-70's - Personally Reviewed  ECG    No new EKG - Personally Reviewed  Physical Exam   GEN: No  acute physical distress.     Neck: No JVD Cardiac: RRR tachycardic, no murmurs, rubs, or gallops.  Respiratory: Clear to auscultation bilaterally. GI: Soft, nontender, non-distended  MS: No edema; No deformity. Neuro:  No gross focal deficits are appreciated, she is AAO x3 this AM Psych: normal affect today   Labs    Chemistry  Recent Labs Lab 07/01/17 1320 07/02/17 0238 07/03/17 0308 07/04/17 0305  NA 141 140 139 139  K 4.0 4.0 3.4* 3.9  CL 107 105 103 104  CO2 25 20* 26 24  GLUCOSE 122* 219* 146* 129*  BUN 19 16 19  25*  CREATININE 1.04* 0.99 1.07* 1.00  CALCIUM 9.3 8.1* 8.7* 8.9  PROT 7.4  --   --   --   ALBUMIN 4.2  --   --   --   AST 31  --   --   --   ALT 54  --   --   --  ALKPHOS 82  --   --   --   BILITOT 0.7  --   --   --   GFRNONAA 50* 53* 48* 53*  GFRAA 58* >60 56* >60  ANIONGAP 9 15 10 11      Hematology  Recent Labs Lab 07/01/17 1320 07/02/17 0238 07/04/17 0305  WBC 9.7 12.2* 10.1  RBC 4.21 4.08 4.20  HGB 12.3 11.8* 11.9*  HCT 37.7 36.7 37.6  MCV 89.5 90.0 89.5  MCH 29.2 28.9 28.3  MCHC 32.6 32.2 31.6  RDW 14.8 14.9 14.5  PLT 327 253 331    Cardiac EnzymesNo results for input(s): TROPONINI in the last 168 hours.   Recent Labs Lab 07/01/17 1317  TROPIPOC 0.02     BNPNo results for input(s): BNP, PROBNP in the last 168 hours.   DDimer   Recent Labs Lab 07/01/17 1320  DDIMER 0.65*     Radiology    Dg Chest Port 1 View Result Date: 07/01/2017 CLINICAL DATA:  Shortness of breath.  Atrial fibrillation. EXAM: PORTABLE CHEST 1 VIEW COMPARISON:  Portable chest dated 09/29/2010 and chest CT dated 11/09/2010. FINDINGS: Improved inspiration. No gross change in enlargement of the cardiac silhouette. Small amount of linear scarring or atelectasis at the left lung base. Otherwise, clear lungs. Diffuse osteopenia. Mild scoliosis. IMPRESSION: No acute abnormality.  Stable mild cardiomegaly. Electronically Signed   By: Claudie Revering M.D.   On:  07/01/2017 13:55    Cardiac Studies   07/03/17 TTE Study Conclusions - Left ventricle: The cavity size was normal. Wall thickness was   increased in a pattern of mild LVH. Systolic function was normal.   The estimated ejection fraction was in the range of 55% to 60%.   Wall motion was normal; there were no regional wall motion   abnormalities. Features are consistent with a pseudonormal left   ventricular filling pattern, with concomitant abnormal relaxation   and increased filling pressure (grade 2 diastolic dysfunction). - Ventricular septum: The contour showed diastolic flattening. - Mitral valve: There was moderate regurgitation. - Left atrium: The atrium was mildly dilated. - Right ventricle: The cavity size was mildly dilated. Wall   thickness was normal. - Tricuspid valve: There was trivial regurgitation. - Pulmonary arteries: Systolic pressure was severely increased. PA   peak pressure: 81 mm Hg (S).  Patient Profile     76 y.o. female with a hx of AFlutter ablated in 2011, HTN, DM, HLD admitted with AFlutter RVR 1:1 conduction, vertigo and UTI, suspect fluid OL  Assessment & Plan    1. AFlutter w/RVR 1:1     EKG is difficult to determine if typical/atypical, suspect typical     Xarelto started     Now in sinus      2. Fluid OL     exam is much better, no c/o SOB, state O2 is bothering her nose     Fluid neg cumulatively  -3053ml     BMET stable     Change to PO lasix with DD on her echo     Try to wean O2 today  2. Vertigo     Not active or ongoing c/o at this time, remains resolved     Patient last night was somewhat paranoid and aggressive with staff, threatening to call 911 trying to "force her to do tests"     She had been unable to tolerate MRI upon admit     Spoke with the patient this morning, she is AAO  x3, and tells me as long as Dr. Rayann Heman and I are OK with what the other doctors are doing she would be agreeable.  Mentions she is distrustful of doctors  generally patient refused MRI  3. HTN     Looks OK  4. UTI     As per medicine team  5. Elevated D. Dimer     SOB sounds orthoppnic and ordered diuresed and is much better     Severely elevated PA pressure by echo     Patient refused CT scan   Signed, Baldwin Jamaica, PA-C  07/04/2017, 9:43 AM    I have seen, examined the patient, and reviewed the above assessment and plan.  On exam, RRR. Changes to above are made where necessary.  Will convert diltiazem to once daily.  Continue amiodarone.  Continue diuresis.  Hopefully home in am if still in sinus rhythm and off O2.  Co Sign: Thompson Grayer, MD 07/04/2017 6:26 PM

## 2017-07-04 NOTE — Progress Notes (Signed)
Patient's urine dark red/brown. Notified MD. H&H ordered; urinalysis ordered. Will continue to monitor

## 2017-07-05 DIAGNOSIS — I483 Typical atrial flutter: Principal | ICD-10-CM

## 2017-07-05 DIAGNOSIS — E119 Type 2 diabetes mellitus without complications: Secondary | ICD-10-CM

## 2017-07-05 LAB — PHOSPHORUS: Phosphorus: 3.8 mg/dL (ref 2.5–4.6)

## 2017-07-05 LAB — COMPREHENSIVE METABOLIC PANEL
ALBUMIN: 3.4 g/dL — AB (ref 3.5–5.0)
ALT: 28 U/L (ref 14–54)
AST: 16 U/L (ref 15–41)
Alkaline Phosphatase: 70 U/L (ref 38–126)
Anion gap: 10 (ref 5–15)
BUN: 19 mg/dL (ref 6–20)
CHLORIDE: 102 mmol/L (ref 101–111)
CO2: 27 mmol/L (ref 22–32)
Calcium: 9.1 mg/dL (ref 8.9–10.3)
Creatinine, Ser: 0.89 mg/dL (ref 0.44–1.00)
GFR calc Af Amer: >60 mL/min (ref >60)
Glucose, Bld: 156 mg/dL — ABNORMAL HIGH (ref 65–99)
POTASSIUM: 4 mmol/L (ref 3.5–5.1)
SODIUM: 139 mmol/L (ref 135–145)
Total Bilirubin: 0.8 mg/dL (ref 0.3–1.2)
Total Protein: 6.5 g/dL (ref 6.5–8.1)

## 2017-07-05 LAB — CBC WITH DIFFERENTIAL/PLATELET
BASOS ABS: 0 10*3/uL (ref 0.0–0.1)
BASOS PCT: 0 %
EOS ABS: 0.2 10*3/uL (ref 0.0–0.7)
Eosinophils Relative: 2 %
HCT: 37.6 % (ref 36.0–46.0)
Hemoglobin: 12.1 g/dL (ref 12.0–15.0)
Lymphocytes Relative: 13 %
Lymphs Abs: 1.4 10*3/uL (ref 0.7–4.0)
MCH: 28.6 pg (ref 26.0–34.0)
MCHC: 32.2 g/dL (ref 30.0–36.0)
MCV: 88.9 fL (ref 78.0–100.0)
MONO ABS: 1.3 10*3/uL — AB (ref 0.1–1.0)
Monocytes Relative: 13 %
Neutro Abs: 7.5 10*3/uL (ref 1.7–7.7)
Neutrophils Relative %: 72 %
PLATELETS: 328 10*3/uL (ref 150–400)
RBC: 4.23 MIL/uL (ref 3.87–5.11)
RDW: 14.2 % (ref 11.5–15.5)
WBC: 10.4 10*3/uL (ref 4.0–10.5)

## 2017-07-05 LAB — GLUCOSE, CAPILLARY
Glucose-Capillary: 134 mg/dL — ABNORMAL HIGH (ref 65–99)
Glucose-Capillary: 154 mg/dL — ABNORMAL HIGH (ref 65–99)

## 2017-07-05 LAB — MAGNESIUM: MAGNESIUM: 2 mg/dL (ref 1.7–2.4)

## 2017-07-05 MED ORDER — RIVAROXABAN 20 MG PO TABS: 20.0000 mg | ORAL_TABLET | Freq: Every day | ORAL | 0 refills | Status: DC

## 2017-07-05 MED ORDER — AMIODARONE HCL 200 MG PO TABS: 200.0000 mg | ORAL_TABLET | Freq: Two times a day (BID) | ORAL | 0 refills | Status: DC

## 2017-07-05 MED ORDER — LEVOTHYROXINE SODIUM 25 MCG PO TABS: 25.0000 ug | ORAL_TABLET | Freq: Every day | ORAL | 0 refills | Status: DC

## 2017-07-05 MED ORDER — ATORVASTATIN CALCIUM 10 MG PO TABS: 10.0000 mg | ORAL_TABLET | Freq: Every day | ORAL | 0 refills | Status: DC

## 2017-07-05 MED ORDER — WHITE PETROLATUM GEL: 1.0000 "application " | 0 refills | Status: DC | PRN

## 2017-07-05 MED ORDER — POTASSIUM CHLORIDE CRYS ER 10 MEQ PO TBCR: 10.0000 meq | EXTENDED_RELEASE_TABLET | Freq: Every day | ORAL | 0 refills | Status: DC

## 2017-07-05 MED ORDER — DILTIAZEM HCL ER COATED BEADS 240 MG PO CP24: 240.0000 mg | ORAL_CAPSULE | Freq: Every day | ORAL | 0 refills | Status: DC

## 2017-07-05 MED ORDER — FUROSEMIDE 20 MG PO TABS: 20.0000 mg | ORAL_TABLET | Freq: Every day | ORAL | 0 refills | Status: DC

## 2017-07-05 NOTE — Care Management Important Message (Signed)
Important Message  Patient Details  Name: Lydia Myers MRN: [id number] Date of Birth: 01-04-1941   Medicare Important Message Given:  Yes    Nathen May 07/05/2017, 9:28 AM

## 2017-07-05 NOTE — Progress Notes (Signed)
SATURATION QUALIFICATIONS: (This note is used to comply with regulatory documentation for home oxygen)  Patient Saturations on Room Air at Rest = 97%  Patient Saturations on Room Air while Ambulating = 90-99%  Patient Saturations on 0 Liters of oxygen while Ambulating = 90-99%  Please briefly explain why patient needs home oxygen: Pt does not have a need at this time for oxygen.  thanks

## 2017-07-05 NOTE — Progress Notes (Signed)
07/05/17 1601  PT Evaluation  Last PT Received On 07/05/17  Assistance Needed +1  History of Present Illness Lydia Myers is a 76 y.o. female with medical history significant of A Flutter in the past s/p ablation in 2011 with Dr. Caryl Comes, HTN, DM, HLD; she presents with two-week history of dizziness.  She was diagnosed with vertigo by her primary care physician and was prescribed meclizine. Despite taking medication, she continued to be very dizzy. In ED found to have A-fib with RVR and MRI for possible CVA and CT for possible PE ordered, but pt has refused these tests.   Precautions  Precautions Fall  Precaution Comments monitor O2 sats and HR during activity.  Restrictions  Weight Bearing Restrictions No  Home Living  Family/patient expects to be discharged to: Private residence  Living Arrangements Alone  Available Help at Discharge Friend(s);Neighbor;Family;Available PRN/intermittently  Type of Home Apartment ("senior citizen apartment" )  Home Access Level entry  [place] One level  Bathroom Shower/Tub Tub/shower unit  [place] (uses 3 in 1 over toilet )  Home Equipment Grab bars - tub/shower;Walker - 2 wheels;Cane - single point;BSC  Prior Function  Level of Independence Needs assistance  Gait / Transfers Assistance Needed uses cane or RW depends on the day and the location of her equipment/convienience  Communication  Communication No difficulties  Pain Assessment  Pain Assessment 0-10  Pain Score 10  Pain Location right elbow  Pain Descriptors / Indicators Sore  Pain Intervention(s) Limited activity within patient's tolerance;Monitored during session;Repositioned;Heat applied  Cognition  Arousal/Alertness Awake/alert  Behavior During Therapy WFL for tasks assessed/performed  Overall Cognitive Status Within Functional Limits for tasks assessed  Upper Extremity Assessment  Upper Extremity Assessment Defer to OT evaluation  RUE Deficits / Details elbow  from blood draw/hematoma (limited elbow flexion and ext ROM and pain at end range.  Lower Extremity Assessment  Lower Extremity Assessment Generalized weakness  Cervical / Trunk Assessment  Cervical / Trunk Assessment Normal  Bed Mobility  General bed mobility comments Pt seated EOB.  Transfers  Overall transfer level Needs assistance  Equipment used Rolling walker (2 wheeled)  Transfers Sit to/from Stand  Sit to Stand Min guard  General transfer comment Min guard assist for safety during transitions.   Ambulation/Gait  Ambulation/Gait assistance Supervision  Ambulation Distance (Feet) 150 Feet  Assistive device Rolling walker (2 wheeled)  Gait Pattern/deviations Step-through pattern;Trunk flexed  General Gait Details Pt walking and talking the entire gait.  HR 112-140 bpm during gait.  Pt asymptomatic.  Pt walked on RA and O2 sats sats varied with poor waveform, but when waveform stabilized it was 91-93% on RA with no DOE during gait at all.    Gait velocity decreased  Balance  Overall balance assessment Needs assistance  Sitting-balance support Feet supported;No upper extremity supported  Sitting balance-Leahy Scale Fair  Sitting balance - Comments Pt has difficulty getting to feet to pull sock off.   Standing balance support Bilateral upper extremity supported  Standing balance-Leahy Scale Fair  Standing balance comment Pt better supported by RW today instead of cane.    General Comments  General comments (skin integrity, edema, etc.) Pt reports no signs of dizziness right now, none when moving around in the bed, and none with gait into the hallway.  I did educate her that she could get therapy for dizziness if it comes back and is truely vertigo vs stroke vs heart issues.   PT - End of  Session  Equipment Utilized During Treatment Gait belt  Activity Tolerance Patient limited by fatigue;Patient limited by pain  Patient left in chair;with call bell/phone within reach;with chair  alarm set  Nurse Communication Mobility status  PT Assessment  PT Recommendation/Assessment Patient needs continued PT services  PT Visit Diagnosis Muscle weakness (generalized) (M62.81);Difficulty in walking, not elsewhere classified (R26.2);Pain  Pain - Right/Left Right  Pain - part of body Arm  PT Problem List Decreased strength;Decreased range of motion;Decreased activity tolerance;Decreased balance;Decreased mobility;Decreased knowledge of use of DME;Cardiopulmonary status limiting activity;Pain  PT Plan  PT Frequency (ACUTE ONLY) Min 3X/week  PT Treatment/Interventions (ACUTE ONLY) Gait training;DME instruction;Functional mobility training;Therapeutic activities;Therapeutic exercise;Balance training;Neuromuscular re-education;Patient/family education;Manual techniques;Modalities  AM-PAC PT "6 Clicks" Daily Activity Outcome Measure  Difficulty turning over in bed (including adjusting bedclothes, sheets and blankets)? 4  Difficulty moving from lying on back to sitting on the side of the bed?  4  Difficulty sitting down on and standing up from a chair with arms (e.g., wheelchair, bedside commode, etc,.)? 4  Help needed moving to and from a bed to chair (including a wheelchair)? 3  Help needed walking in hospital room? 3  Help needed climbing 3-5 steps with a railing?  3  6 Click Score 21  Mobility G Code  CJ  PT Recommendation  Follow Up Recommendations Home health PT  PT equipment None recommended by PT  Individuals Consulted  Consulted and Agree with Results and Recommendations Patient  Acute Rehab PT Goals  Patient Stated Goal to go home later today  PT Goal Formulation With patient  Time For Goal Achievement 07/19/17  Potential to Achieve Goals Good  PT Time Calculation  PT Start Time (ACUTE ONLY) 1202  PT Stop Time (ACUTE ONLY) 1240  PT Time Calculation (min) (ACUTE ONLY) 38 min  PT General Charges  $$ ACUTE PT VISIT 1 Visit  PT Evaluation  $PT Eval Moderate Complexity 1  Mod  PT Treatments  $Gait Training 8-22 mins  $Therapeutic Activity 8-22 mins  Written Expression  Dominant Hand Right  07/05/2017 Pt generally weak and deconditioned and weak on her feet requiring a RW instead of her normal cane.  HR ranged from 112-140 during gait with pt reporting no symptoms of heart racing or DOE.  O2 sats (when I could get a good waveform on the monitor) were 91-93% on RA with no reports of DOE and no audible DOE.  She plans to go home with her daughter staying with her through the weekend.  PT to follow acutely for deficits listed above.  Barbarann Ehlers [place], [place], DPT [phone]

## 2017-07-05 NOTE — Discharge Summary (Signed)
Physician Discharge Summary  Lydia Myers SWF:[id number] DOB: 02/08/1941 DOA: 07/01/2017  PCP: Kelton Pillar, MD  Admit date: 07/01/2017 Discharge date: 07/05/2017  Admitted From: Home Disposition:  Home with Home Health  Recommendations for Outpatient Follow-up:  1. Follow up with PCP in 1-2 weeks 2. Follow up with Cardiology as an outpatient 3. Follow up with [facility] on 07/11/17 at 09:30 4. Please obtain CMP/CBC, Mag, Phos  in one week  Home Health: YES  Equipment/Devices: None    Discharge Condition: Stable CODE STATUS: FULL CODE Diet recommendation: Heart Healthy Diet   Brief/Interim Summary: 76 y.o.BF PMHxA Flutter in thepast s/p ablation in 2011 with Dr. Caryl Comes, HTN, COPD, DM type 2 controlled, HLD and other comorbids who presented with two-week history of dizziness. She states that the room spins around her, causing her to sit down. She has not passed out from this. She was diagnosed with vertigo by her primary care physician and was prescribed meclizine. Despite taking medication, she continuedto be very dizzy. Today, she also noted heart palpitations like "the heart was trying to feed the brain." She denies any fevers or chills, no chest pain or pressure, denies any shortness of breath. No previous nausea or vomiting, no abdominal pain. She does also admit to dysuria. Was admitted and found to have SVT with Underlying A Fib/Flutter and Cardiology evaluated and are adjusted her meds and got her of IV drips. Workup also revealed a Klebsiella UTI that was treated with IV Ceftriaxone for 5 days along with Hypothyroidism. She also had Diastolic Heart failure for which se was diuresed and improved. Patient was deemed medically stable and did not require O2 at D/C and will be D/C'd Home with Home Health PT. She is to follow up with PCP and with Cardiology and [facility] as an outpatient.   Discharge Diagnoses:  Active Problems:   Essential hypertension   Atrial fibrillation  with rapid ventricular response (HCC)   Atrial fibrillation with RVR (HCC)   Hypothyroidism   Dizziness   Elevated d-dimer   Klebsiella pneumoniae infection  Chronic diastolic CHF -Strict in and out since admission -3.9 L -IV Diuresis changed to po by Cardiology; C/w Furosemide 20 mg po Daily at D/c -Daily weights  Severe Pulmonary HTN -Seebelow -C/w Diuresis and change to po  20 mg   SVT and underlying A Flutter/Fib With RVR (CHADSVASc>5) -Hx of A Flutter s/p ablation by Dr. Larae Grooms 2011 (report under media tab)  -IV Amiodarone changed to po Amiodarone 200 mg po BID -C/w Diltiazem 240 mg po Daily -Losartan 300 mgdaily -HCTZ 25 mgdaily -Metoprolol IV PRN  -C/w Xarelto 20 mg -Follow up in [facility]   Essential HTN -C/w Diltiazem 240 mg po Daily -Losartan 300 mgdaily -HCTZ 25 mgdaily  Hypothyroidism (subacute?) -TSH 6.6 -Free T3 low at 1.9 /free T4 low normal -Given given patient's A. fib/flutter, started Synthroid 25 g -Recheck TSH in 6-8 weeks with PCP   Dizziness  -Resolved.  -Most likely secondary to a flutter but given patient's risk factors obtain MRI -PATIENT REFUSED BRAIN MRI. Discussed with patient at length the need for brain MRI to determine if she has had a stroke. Advised patient that if she has a stroke and it is undetected/untreated at greater risk of future stroke/DEATH. Patient states she understands and still refused test.  Elevated D-Dimer -Patient anticoagulated for A. fib/flutter which will cover patient if she has PE -Lower Extremity Venous Duplex showed No evidence of deep vein  thrombosis involving the visualized veins of the right lower extremity. No evidence of deep vein thrombosis involving the visualized veins of the left lower extremity. No evidence of Baker&'s cyst on the right or left. -Discussed with patient at length need to obtain CTA chest to R/O PE, patient understands the risks of undiagnosed PE to include extension of  current PE, increased risk for future PE, increased risk for death. Patient refused exam.  PositiveKlebsiella pneumoniae UTI  -Completed five-day course Antibiotics with IV Ceftriaxone  DM Type 2 controlled with complication -Resume home PO meds -8/27 Hemoglobin A1c=6.2    HLD -Lipid panel within ADA guidelines -C/w Atorvastatin 10 mg po qHS  Hypokalemia -Potassium goal>4 -K-Dur 10 mEq  Hypomagnesemia -Magnesium goal>2 -Magnesium IV 2 g    Glaucoma  -C/w Brinzolamide and Latanoprost      Hematuria, resolved -From the Purewick causing irritation -No further episodes  Discharge Instructions  Discharge Instructions    Call MD for:  difficulty breathing, headache or visual disturbances    Complete by:  As directed    Call MD for:  extreme fatigue    Complete by:  As directed    Call MD for:  hives    Complete by:  As directed    Call MD for:  persistant dizziness or light-headedness    Complete by:  As directed    Call MD for:  persistant nausea and vomiting    Complete by:  As directed    Call MD for:  redness, tenderness, or signs of infection (pain, swelling, redness, odor or green/yellow discharge around incision site)    Complete by:  As directed    Call MD for:  severe uncontrolled pain    Complete by:  As directed    Call MD for:  temperature >100.4    Complete by:  As directed    Diet - low sodium heart healthy    Complete by:  As directed    Diet Carb Modified    Complete by:  As directed    Discharge instructions    Complete by:  As directed    Follow Up with PCP and Cardiology as an outpatient. Take all medications as prescribed. If symptoms change or worsen please return to the ED for evaluation.   Increase activity slowly    Complete by:  As directed      Allergies as of 07/05/2017      Reactions   Shrimp [shellfish Allergy] Nausea And Vomiting      Medication List    STOP taking these medications   aspirin 81 MG tablet    simvastatin 20 MG tablet Commonly known as:  ZOCOR     TAKE these medications   amiodarone 200 MG tablet Commonly known as:  PACERONE Take 1 tablet (200 mg total) by mouth 2 (two) times daily.   atorvastatin 10 MG tablet Commonly known as:  LIPITOR Take 1 tablet (10 mg total) by mouth daily at 6 PM.   AZOPT 1 % ophthalmic suspension Generic drug:  brinzolamide Place 1 drop into both eyes 2 (two) times daily.   clotrimazole-betamethasone cream Commonly known as:  LOTRISONE Apply 1 application topically daily as needed for irritation. Apply 1 application every day as needed for vaginal irritation   diltiazem 240 MG 24 hr capsule Commonly known as:  CARDIZEM CD Take 1 capsule (240 mg total) by mouth daily.   furosemide 20 MG tablet Commonly known as:  LASIX Take 1 tablet (20 mg  total) by mouth daily.   JANUVIA 100 MG tablet Generic drug:  sitaGLIPtin Take 100 mg by mouth daily.   levothyroxine 25 MCG tablet Commonly known as:  SYNTHROID, LEVOTHROID Take 1 tablet (25 mcg total) by mouth daily before breakfast.   meclizine 12.5 MG tablet Commonly known as:  ANTIVERT Take 12.5-25 mg by mouth 3 (three) times daily as needed for dizziness.   metFORMIN 850 MG tablet Commonly known as:  GLUCOPHAGE Take 850-1,700 mg by mouth 2 (two) times daily. Take 850mg  by mouth in the morning, and take 1700mg  by mouth in the evening   potassium chloride 10 MEQ tablet Commonly known as:  K-DUR,KLOR-CON Take 1 tablet (10 mEq total) by mouth daily.   rivaroxaban 20 MG Tabs tablet Commonly known as:  XARELTO Take 1 tablet (20 mg total) by mouth daily with supper.   TRAVATAN Z 0.004 % Soln ophthalmic solution Generic drug:  Travoprost (BAK Free) Place 1 drop into both eyes every evening.   valsartan-hydrochlorothiazide 320-25 MG tablet Commonly known as:  DIOVAN-HCT Take 1 tablet by mouth daily.   white petrolatum Gel Commonly known as:  VASELINE Apply 1 application topically as  needed for lip care.            Discharge Care Instructions        Start     Ordered   07/06/17 0000  diltiazem (CARDIZEM CD) 240 MG 24 hr capsule  Daily     07/05/17 1559   07/06/17 0000  furosemide (LASIX) 20 MG tablet  Daily     07/05/17 1559   07/06/17 0000  levothyroxine (SYNTHROID, LEVOTHROID) 25 MCG tablet  Daily before breakfast     07/05/17 1559   07/06/17 0000  potassium chloride (K-DUR,KLOR-CON) 10 MEQ tablet  Daily     07/05/17 1559   07/05/17 0000  amiodarone (PACERONE) 200 MG tablet  2 times daily     07/05/17 1559   07/05/17 0000  atorvastatin (LIPITOR) 10 MG tablet  Daily-1800     07/05/17 1559   07/05/17 0000  white petrolatum (VASELINE) GEL  As needed     07/05/17 1559   07/05/17 0000  Increase activity slowly     07/05/17 1614   07/05/17 0000  Diet - low sodium heart healthy     07/05/17 1614   07/05/17 0000  Discharge instructions    Comments:  Follow Up with PCP and Cardiology as an outpatient. Take all medications as prescribed. If symptoms change or worsen please return to the ED for evaluation.   07/05/17 1614   07/05/17 0000  Call MD for:  temperature >100.4     07/05/17 1614   07/05/17 0000  Call MD for:  persistant nausea and vomiting     07/05/17 1614   07/05/17 0000  Call MD for:  severe uncontrolled pain     07/05/17 1614   07/05/17 0000  Call MD for:  redness, tenderness, or signs of infection (pain, swelling, redness, odor or green/yellow discharge around incision site)     07/05/17 1614   07/05/17 0000  Call MD for:  difficulty breathing, headache or visual disturbances     07/05/17 1614   07/05/17 0000  Call MD for:  hives     07/05/17 1614   07/05/17 0000  Call MD for:  persistant dizziness or light-headedness     07/05/17 1614   07/05/17 0000  Call MD for:  extreme fatigue     07/05/17 1614  07/05/17 0000  Diet Carb Modified     07/05/17 1614   07/05/17 0000  rivaroxaban (XARELTO) 20 MG TABS tablet  Daily with supper      07/05/17 1650     Follow-up Information    [facility] Follow up on 07/11/2017.   Specialty:  Cardiology Why:  9:30AM  Contact information: [address] [id number] [place], [place] Follow up.   Why:  HHPT/aide arranged - they will call you to set up home visits Contact information: [place] [address] [phone]          Allergies  Allergen Reactions  . Shrimp [Shellfish Allergy] Nausea And Vomiting   Consultations:  Cardiology- EP  Procedures/Studies: Dg Chest Port 1 View  Result Date: 07/01/2017 CLINICAL DATA:  Shortness of breath.  Atrial fibrillation. EXAM: PORTABLE CHEST 1 VIEW COMPARISON:  Portable chest dated 09/29/2010 and chest CT dated 11/09/2010. FINDINGS: Improved inspiration. No gross change in enlargement of the cardiac silhouette. Small amount of linear scarring or atelectasis at the left lung base. Otherwise, clear lungs. Diffuse osteopenia. Mild scoliosis. IMPRESSION: No acute abnormality.  Stable mild cardiomegaly. Electronically Signed   By: Claudie Revering M.D.   On: 07/01/2017 13:55    ECHOCARDIOGRAM Study Conclusions  - Left ventricle: The cavity size was normal. Wall thickness was   increased in a pattern of mild LVH. Systolic function was normal.   The estimated ejection fraction was in the range of 55% to 60%.   Wall motion was normal; there were no regional wall motion   abnormalities. Features are consistent with a pseudonormal left   ventricular filling pattern, with concomitant abnormal relaxation   and increased filling pressure (grade 2 diastolic dysfunction). - Ventricular septum: The contour showed diastolic flattening. - Mitral valve: There was moderate regurgitation. - Left atrium: The atrium was mildly dilated. - Right ventricle: The cavity size was mildly dilated. Wall   thickness was normal. -  Tricuspid valve: There was trivial regurgitation. - Pulmonary arteries: Systolic pressure was severely increased. PA   peak pressure: 81 mm Hg (S).   Subjective: Seen and examined and felt better and had no complaints. Wanting to go home.  Discharge Exam: Vitals:   07/05/17 1240 07/05/17 1454  BP:  (!) 87/74  Pulse: (!) 140 100  Resp:  (!) 21  Temp:  98.3 F (36.8 C)  SpO2: 92% 96%   Vitals:   07/05/17 1018 07/05/17 1019 07/05/17 1240 07/05/17 1454  BP:    (!) 87/74  Pulse: (!) 102 96 (!) 140 100  Resp:  19  (!) 21  Temp:    98.3 F (36.8 C)  TempSrc:    Oral  SpO2:   92% 96%  Weight:      Height:       General: Pt is alert, awake, not in acute distress Cardiovascular: RRR, S1/S2 +, no rubs, no gallops Respiratory: CTA bilaterally, no wheezing, no rhonchi Abdominal: Soft, NT, ND, bowel sounds + Extremities: no edema, no cyanosis; Has some Right had chronic weakness  The results of significant diagnostics from this hospitalization (including imaging, microbiology, ancillary and laboratory) are listed below for reference.    Microbiology: Recent Results (from the past 240 hour(s))  Urine culture     Status: Abnormal   Collection Time: 07/01/17 12:59 PM  Result Value Ref Range Status   Specimen Description URINE, RANDOM  Final   Special Requests NONE  Final   Culture >=100,000 COLONIES/mL KLEBSIELLA PNEUMONIAE (A)  Final   Report Status 07/03/2017 FINAL  Final   Organism ID, Bacteria KLEBSIELLA PNEUMONIAE (A)  Final      Susceptibility   Klebsiella pneumoniae - MIC*    AMPICILLIN >=32 RESISTANT Resistant     CEFAZOLIN <=4 SENSITIVE Sensitive     CEFTRIAXONE <=1 SENSITIVE Sensitive     CIPROFLOXACIN >=4 RESISTANT Resistant     GENTAMICIN <=1 SENSITIVE Sensitive     IMIPENEM 0.5 SENSITIVE Sensitive     NITROFURANTOIN 128 RESISTANT Resistant     TRIMETH/SULFA <=20 SENSITIVE Sensitive     AMPICILLIN/SULBACTAM 8 SENSITIVE Sensitive     PIP/TAZO 8 SENSITIVE Sensitive      Extended ESBL NEGATIVE Sensitive     * >=100,000 COLONIES/mL KLEBSIELLA PNEUMONIAE  MRSA PCR Screening     Status: None   Collection Time: 07/01/17 11:35 PM  Result Value Ref Range Status   MRSA by PCR NEGATIVE NEGATIVE Final    Comment:        The GeneXpert MRSA Assay (FDA approved for NASAL specimens only), is one component of a comprehensive MRSA colonization surveillance program. It is not intended to diagnose MRSA infection nor to guide or monitor treatment for MRSA infections.   Culture, blood (routine x 2)     Status: None (Preliminary result)   Collection Time: 07/02/17  7:15 PM  Result Value Ref Range Status   Specimen Description BLOOD LEFT HAND  Final   Special Requests IN PEDIATRIC BOTTLE Blood Culture adequate volume  Final   Culture NO GROWTH 3 DAYS  Final   Report Status PENDING  Incomplete  Culture, blood (routine x 2)     Status: None (Preliminary result)   Collection Time: 07/02/17  7:22 PM  Result Value Ref Range Status   Specimen Description BLOOD RIGHT HAND  Final   Special Requests   Final    BOTTLES DRAWN AEROBIC AND ANAEROBIC Blood Culture adequate volume   Culture NO GROWTH 3 DAYS  Final   Report Status PENDING  Incomplete    Labs: BNP (last 3 results) No results for input(s): BNP in the last 8760 hours. Basic Metabolic Panel:  Recent Labs Lab 07/01/17 1320 07/02/17 0238 07/03/17 0308 07/04/17 0305 07/05/17 0258  NA 141 140 139 139 139  K 4.0 4.0 3.4* 3.9 4.0  CL 107 105 103 104 102  CO2 25 20* 26 24 27   GLUCOSE 122* 219* 146* 129* 156*  BUN 19 16 19  25* 19  CREATININE 1.04* 0.99 1.07* 1.00 0.89  CALCIUM 9.3 8.1* 8.7* 8.9 9.1  MG  --   --  1.5* 2.1 2.0  PHOS  --   --   --   --  3.8   Liver Function Tests:  Recent Labs Lab 07/01/17 1320 07/05/17 0258  AST 31 16  ALT 54 28  ALKPHOS 82 70  BILITOT 0.7 0.8  PROT 7.4 6.5  ALBUMIN 4.2 3.4*   No results for input(s): LIPASE, AMYLASE in the last 168 hours. No results for  input(s): AMMONIA in the last 168 hours. CBC:  Recent Labs Lab 07/01/17 1320 07/02/17 0238 07/04/17 0305 07/04/17 1032 07/05/17 0258  WBC 9.7 12.2* 10.1  --  10.4  NEUTROABS 7.4  --   --   --  7.5  HGB 12.3 11.8* 11.9* 12.0 12.1  HCT 37.7 36.7 37.6 37.3 37.6  MCV 89.5 90.0 89.5  --  88.9  PLT 327 253 331  --  328   Cardiac Enzymes: No results for input(s): CKTOTAL, CKMB, CKMBINDEX, TROPONINI in the last 168 hours. BNP: Invalid input(s): POCBNP CBG:  Recent Labs Lab 07/04/17 1108 07/04/17 1614 07/04/17 2057 07/05/17 0604 07/05/17 1115  GLUCAP 244* 173* 175* 134* 154*   D-Dimer No results for input(s): DDIMER in the last 72 hours. Hgb A1c No results for input(s): HGBA1C in the last 72 hours. Lipid Profile No results for input(s): CHOL, HDL, LDLCALC, TRIG, CHOLHDL, LDLDIRECT in the last 72 hours. Thyroid function studies No results for input(s): TSH, T4TOTAL, T3FREE, THYROIDAB in the last 72 hours.  Invalid input(s): FREET3 Anemia work up No results for input(s): VITAMINB12, FOLATE, FERRITIN, TIBC, IRON, RETICCTPCT in the last 72 hours. Urinalysis    Component Value Date/Time   COLORURINE AMBER (A) 07/04/2017 0629   APPEARANCEUR HAZY (A) 07/04/2017 0629   LABSPEC 1.016 07/04/2017 0629   PHURINE 5.0 07/04/2017 0629   GLUCOSEU NEGATIVE 07/04/2017 0629   HGBUR LARGE (A) 07/04/2017 0629   BILIRUBINUR NEGATIVE 07/04/2017 0629   KETONESUR NEGATIVE 07/04/2017 0629   PROTEINUR 100 (A) 07/04/2017 0629   UROBILINOGEN 1.0 09/30/2010 1812   NITRITE NEGATIVE 07/04/2017 0629   LEUKOCYTESUR NEGATIVE 07/04/2017 0629   Sepsis Labs Invalid input(s): PROCALCITONIN,  WBC,  LACTICIDVEN Microbiology Recent Results (from the past 240 hour(s))  Urine culture     Status: Abnormal   Collection Time: 07/01/17 12:59 PM  Result Value Ref Range Status   Specimen Description URINE, RANDOM  Final   Special Requests NONE  Final   Culture >=100,000 COLONIES/mL KLEBSIELLA PNEUMONIAE  (A)  Final   Report Status 07/03/2017 FINAL  Final   Organism ID, Bacteria KLEBSIELLA PNEUMONIAE (A)  Final      Susceptibility   Klebsiella pneumoniae - MIC*    AMPICILLIN >=32 RESISTANT Resistant     CEFAZOLIN <=4 SENSITIVE Sensitive     CEFTRIAXONE <=1 SENSITIVE Sensitive     CIPROFLOXACIN >=4 RESISTANT Resistant     GENTAMICIN <=1 SENSITIVE Sensitive     IMIPENEM 0.5 SENSITIVE Sensitive     NITROFURANTOIN 128 RESISTANT Resistant     TRIMETH/SULFA <=20 SENSITIVE Sensitive     AMPICILLIN/SULBACTAM 8 SENSITIVE Sensitive     PIP/TAZO 8 SENSITIVE Sensitive     Extended ESBL NEGATIVE Sensitive     * >=100,000 COLONIES/mL KLEBSIELLA PNEUMONIAE  MRSA PCR Screening     Status: None   Collection Time: 07/01/17 11:35 PM  Result Value Ref Range Status   MRSA by PCR NEGATIVE NEGATIVE Final    Comment:        The GeneXpert MRSA Assay (FDA approved for NASAL specimens only), is one component of a comprehensive MRSA colonization surveillance program. It is not intended to diagnose MRSA infection nor to guide or monitor treatment for MRSA infections.   Culture, blood (routine x 2)     Status: None (Preliminary result)   Collection Time: 07/02/17  7:15 PM  Result Value Ref Range Status   Specimen Description BLOOD LEFT HAND  Final   Special Requests IN PEDIATRIC BOTTLE Blood Culture adequate volume  Final   Culture NO GROWTH 3 DAYS  Final   Report Status PENDING  Incomplete  Culture, blood (routine x 2)     Status: None (Preliminary result)   Collection Time: 07/02/17  7:22 PM  Result Value Ref Range Status   Specimen Description BLOOD RIGHT HAND  Final   Special Requests   Final  BOTTLES DRAWN AEROBIC AND ANAEROBIC Blood Culture adequate volume   Culture NO GROWTH 3 DAYS  Final   Report Status PENDING  Incomplete   Time coordinating discharge: 35 minutes  SIGNED:  Kerney Elbe, DO [facility] 07/05/2017, 10:00 PM Pager [phone]  If 7PM-7AM, please contact  night-coverage [email or web address] Password [email or web address]

## 2017-07-05 NOTE — Evaluation (Signed)
Occupational Therapy Evaluation and Discharge Patient Details Name: Lydia Myers MRN: [id number] DOB: 1941/06/22 Today's Date: 07/05/2017    History of Present Illness Sharilyn Geisinger Metzner is a 76 y.o. female with medical history significant of A Flutter in the past s/p ablation in 2011 with Dr. Caryl Comes, HTN, DM, HLD; she presents with two-week history of dizziness.  She was diagnosed with vertigo by her primary care physician and was prescribed meclizine. Despite taking medication, she continued to be very dizzy. In ED found to have A-fib with RVR and MRI for possible CVA and CT for possible PE ordered, but pt has refused these tests.    Clinical Impression   This 76 yo female admitted with above presents to acute OT at a Mod I level, no further OT needs, we will D/C pt from OT.    Follow Up Recommendations  No OT follow up    Equipment Recommendations  None recommended by OT       Precautions / Restrictions Precautions Precautions: Fall Precaution Comments:  monitor O2 sats and HR during activity. Restrictions Weight Bearing Restrictions: No      Mobility Bed Mobility               General bed mobility comments: Pt up in recliner upon my arrival  Transfers Overall transfer level: Modified independent Equipment used: Straight cane Transfers: Sit to/from Stand           General transfer comment: In her room and bathroom    Balance Overall balance assessment: Needs assistance Sitting-balance support: Feet supported;No upper extremity supported Sitting balance-Leahy Scale: Good     Standing balance support: Single extremity supported Standing balance-Leahy Scale: Poor Standing balance comment: reliant on SPC                           ADL either performed or assessed with clinical judgement   ADL Overall ADL's : Modified independent                                             Vision Patient Visual Report: No change from  baseline              Pertinent Vitals/Pain Pain Assessment: 0-10 Pain Score: 8  Pain Location: right elbow Pain Descriptors / Indicators: Sore Pain Intervention(s): Limited activity within patient's tolerance;Monitored during session;Repositioned;Heat applied     Hand Dominance  Right    Extremity/Trunk Assessment Upper Extremity Assessment Upper Extremity Assessment: RUE deficits/detail RUE Deficits / Details: elbow from blood draw/hematoma RUE Coordination: decreased gross motor (for full elbow extension and flexion)     Communication Communication Communication:No difficulties    Cognition Arousal/Alertness: Awake/alert Behavior During Therapy: WFL for tasks assessed/performed Overall Cognitive Status: Within Functional Limits for tasks assessed                                                Home Living Family/patient expects to be discharged to::  Private residence  Living Arrangements: Alone   Available Help at Discharge:  Friend(s);Neighbor;Family;Available PRN/intermittently Type of Home: Apartment "senior citizen apartment" Home Access:  Level entry     Home Layout:One level     Bathroom Shower/Tub: Tub/shower unit  Bathroom Toilet: Standard uses 3 in 1 over toilet  Home Equipment: Grab bars - tub/shower;Walker - 2 wheels;Cane - single point;Bedside commode Adaptive Equipment: Sock aid Additional Comments: Asked about new sock aid and I informed her where she could get one      Prior Functioning/Environment Level of Independence:  Needs assistance  Gait / Transfers Assistance Needed: uses cane or RW depends on the day and the location of her equipment/convienience                        OT Goals(Current goals can be found in the care plan section) Acute Rehab OT Goals Patient Stated Goal: to go home today  OT Frequency:                AM-PAC PT "6 Clicks" Daily Activity     Outcome Measure Help from another person  eating meals?: None Help from another person taking care of personal grooming?: None Help from another person toileting, which includes using toliet, bedpan, or urinal?: None Help from another person bathing (including washing, rinsing, drying)?: None Help from another person to put on and taking off regular upper body clothing?: None Help from another person to put on and taking off regular lower body clothing?: None 6 Click Score: 24   End of Session Equipment Utilized During Treatment:  [facility])  Activity Tolerance: Patient tolerated treatment well Patient left: in chair;with call bell/phone within reach                   Time: 1411-1431 OT Time Calculation (min): 20 min Charges:  OT General Charges $OT Visit: 1 Visit OT Evaluation $OT Eval Moderate Complexity: [address] [place], [place] [phone] 07/05/2017

## 2017-07-05 NOTE — Care Management Note (Signed)
Case Management Note Original Note Created Maryclare Labrador, RN 07/03/2017, 9:41 AM  Patient Details  Name: Lydia Myers MRN: [id number] Date of Birth: August 08, 1941  Subjective/Objective:     Pt admitted with A fib with RVR               Action/Plan:   PTA independent from home.  CM provided free 30 day Xarelto card and informed pt of copay.  Pt informed CM that preferred pharmacy is [facility] off [address] can fill prescription as prescribed.   Expected Discharge Date:  07/05/17               Expected Discharge Plan:  [place]  In-House Referral:     Discharge planning Services  CM Consult  Post Acute Care Choice:  Home Health Choice offered to:  Patient  DME Arranged:  N/A DME Agency:  NA  HH Arranged:  PT, Nurse's Aide [place] Agency:  [place]  Status of Service:  Completed, signed off  If discussed at [place] of Stay Meetings, dates discussed:    Additional Comments:  07/05/17- 1630- Marvetta Gibbons RN, CM- pt for d/c home today- spoke with pt at bedside- confirmed pt had 30 day free card for Xarelto- and understood copay cost after that. Pt also will need HH - MD to place orders- choice offered for [facility] agency in [facility]- per pt she would like to use [facility] for services- pt states she has needed DME- referral called to Santiago Glad with [facility] for HHPT/aide- referral accepted-   Maryclare Labrador, RN 07/03/2017, 9:41 AM-- 1. XARELTO  20 MG BID   COVER- YES  CO-PAY-$ 45.00  TIER- 3 DRUG  PRIOR APPROVAL- NO   2 . XARELTO 15 MG BID   COVER- YES  CO-PAY- $ 45.00  TIER - 3 DRUG  PRIOR APPROVAL- NO    Dahlia Client [place], RN 07/05/2017, 4:29 PM [phone]

## 2017-07-05 NOTE — Progress Notes (Signed)
Electrophysiology Rounding Note  Patient Name: Lydia Myers Date of Encounter: 07/05/2017  Primary Cardiologist: Dr Caryl Comes  Subjective   The patient is doing well today.  At this time, the patient denies chest pain, shortness of breath, or any new concerns.  She wants to go home  Inpatient Medications    Scheduled Meds: . amiodarone  200 mg Oral BID  . atorvastatin  10 mg Oral q1800  . brinzolamide  1 drop Both Eyes BID  . diltiazem  240 mg Oral Daily  . fentaNYL (SUBLIMAZE) injection  100 mcg Intravenous Once  . furosemide  20 mg Oral Daily  . irbesartan  300 mg Oral Daily   And  . hydrochlorothiazide  25 mg Oral Daily  . insulin aspart  0-9 Units Subcutaneous TID WC  . latanoprost  1 drop Both Eyes QHS  . levothyroxine  25 mcg Oral QAC breakfast  . mouth rinse  15 mL Mouth Rinse BID  . potassium chloride  10 mEq Oral Daily  . rivaroxaban  20 mg Oral Q supper  . verapamil  5 mg Intravenous Once   Continuous Infusions: . cefTRIAXone (ROCEPHIN)  IV Stopped (07/04/17 1504)   PRN Meds: iopamidol, ondansetron (ZOFRAN) IV, white petrolatum   Vital Signs    Vitals:   07/05/17 0802 07/05/17 0815 07/05/17 1018 07/05/17 1019  BP:  113/60    Pulse: 71 68 (!) 102 96  Resp: (!) 24 (!) 22  19  Temp:  99.2 F (37.3 C)    TempSrc:  Oral    SpO2:  95%    Weight:      Height:        Intake/Output Summary (Last 24 hours) at 07/05/17 1233 Last data filed at 07/05/17 1141  Gross per 24 hour  Intake            79.88 ml  Output             1002 ml  Net          -922.12 ml   Filed Weights   07/02/17 1237 07/04/17 0400 07/05/17 0620  Weight: 166 lb (75.3 kg) 158 lb 3.2 oz (71.8 kg) 153 lb 1.6 oz (69.4 kg)    Physical Exam    GEN- The patient is well appearing, alert and oriented x 3 today.   Head- normocephalic, atraumatic Eyes-  Sclera clear, conjunctiva pink Ears- hearing intact Oropharynx- clear Neck- supple Lungs- Clear to ausculation bilaterally, normal work  of breathing Heart- irregular rate and rhythm, no murmurs, rubs or gallops GI- soft, NT, ND, + BS Extremities- no clubbing, cyanosis, or edema Skin- no rash or lesion Psych- euthymic mood, full affect Neuro- strength and sensation are intact  Labs    CBC  Recent Labs  07/04/17 0305 07/04/17 1032 07/05/17 0258  WBC 10.1  --  10.4  NEUTROABS  --   --  7.5  HGB 11.9* 12.0 12.1  HCT 37.6 37.3 37.6  MCV 89.5  --  88.9  PLT 331  --  301   Basic Metabolic Panel  Recent Labs  07/04/17 0305 07/05/17 0258  NA 139 139  K 3.9 4.0  CL 104 102  CO2 24 27  GLUCOSE 129* 156*  BUN 25* 19  CREATININE 1.00 0.89  CALCIUM 8.9 9.1  MG 2.1 2.0  PHOS  --  3.8   Liver Function Tests  Recent Labs  07/05/17 0258  AST 16  ALT 28  ALKPHOS 70  BILITOT 0.8  PROT 6.5  ALBUMIN 3.4*   Thyroid Function Tests  Recent Labs  07/02/17 1515 07/02/17 1817  TSH 6.660*  --   T3FREE  --  1.9*    Telemetry    Intermittent atrial flutter, V rates improved (personally reviewed)   Patient Profile     Lydia Myers is a 76 y.o. female with a past medical history significant for atrial arrhythmias.  He was admitted for tachycardia and SOB.   Assessment & Plan    1. Atrial flutter Clinically improved Likely typical Could consider repeat ablation in the future once more stable Continue amiodarone oral load V rates are improved with diltiazem,  Could continue amiodarone load at home Outpatient follow-up in AF clnic  2. Acute diastolic dysfunction Improving with diuresis Would continue low dose lasix at home  3. HTN Stable No change required today  4. UTI Per medicines team  OK to discharge from my standpoint Will arrange AF clinic follow-up for next week  Thompson Grayer MD, [facility] 07/05/2017 12:36 PM

## 2017-07-07 LAB — CULTURE, BLOOD (ROUTINE X 2)
CULTURE: NO GROWTH
Culture: NO GROWTH
SPECIAL REQUESTS: ADEQUATE
SPECIAL REQUESTS: ADEQUATE

## 2017-07-11 ENCOUNTER — Ambulatory Visit (HOSPITAL_COMMUNITY)
Admission: RE | Admit: 2017-07-11 | Discharge: 2017-07-11 | Disposition: A | Discharge status: Home / Self Care | Payer: Medicare Other | Source: Ambulatory Visit | Attending: Nurse Practitioner | Admitting: Nurse Practitioner

## 2017-07-11 ENCOUNTER — Encounter (HOSPITAL_COMMUNITY): Payer: Self-pay | Admitting: Nurse Practitioner

## 2017-07-11 VITALS — BP 146/56 | HR 77 | Ht 61.0 in | Wt 158.0 lb

## 2017-07-11 DIAGNOSIS — Z833 Family history of diabetes mellitus: Secondary | ICD-10-CM | POA: Diagnosis not present

## 2017-07-11 DIAGNOSIS — Z9889 Other specified postprocedural states: Secondary | ICD-10-CM | POA: Diagnosis not present

## 2017-07-11 DIAGNOSIS — Z91013 Allergy to seafood: Secondary | ICD-10-CM | POA: Diagnosis not present

## 2017-07-11 DIAGNOSIS — I5032 Chronic diastolic (congestive) heart failure: Secondary | ICD-10-CM | POA: Insufficient documentation

## 2017-07-11 DIAGNOSIS — H409 Unspecified glaucoma: Secondary | ICD-10-CM | POA: Insufficient documentation

## 2017-07-11 DIAGNOSIS — Z7984 Long term (current) use of oral hypoglycemic drugs: Secondary | ICD-10-CM | POA: Insufficient documentation

## 2017-07-11 DIAGNOSIS — Z7901 Long term (current) use of anticoagulants: Secondary | ICD-10-CM | POA: Diagnosis not present

## 2017-07-11 DIAGNOSIS — I483 Typical atrial flutter: Secondary | ICD-10-CM

## 2017-07-11 DIAGNOSIS — I4891 Unspecified atrial fibrillation: Secondary | ICD-10-CM | POA: Insufficient documentation

## 2017-07-11 DIAGNOSIS — E119 Type 2 diabetes mellitus without complications: Secondary | ICD-10-CM | POA: Diagnosis not present

## 2017-07-11 DIAGNOSIS — Z981 Arthrodesis status: Secondary | ICD-10-CM | POA: Diagnosis not present

## 2017-07-11 DIAGNOSIS — J449 Chronic obstructive pulmonary disease, unspecified: Secondary | ICD-10-CM | POA: Diagnosis not present

## 2017-07-11 DIAGNOSIS — Z96653 Presence of artificial knee joint, bilateral: Secondary | ICD-10-CM | POA: Insufficient documentation

## 2017-07-11 DIAGNOSIS — I110 Hypertensive heart disease with heart failure: Secondary | ICD-10-CM | POA: Diagnosis not present

## 2017-07-11 DIAGNOSIS — E7800 Pure hypercholesterolemia, unspecified: Secondary | ICD-10-CM | POA: Insufficient documentation

## 2017-07-11 DIAGNOSIS — Z90710 Acquired absence of both cervix and uterus: Secondary | ICD-10-CM | POA: Insufficient documentation

## 2017-07-11 LAB — BASIC METABOLIC PANEL
ANION GAP: 10 (ref 5–15)
BUN: 26 mg/dL — ABNORMAL HIGH (ref 6–20)
CALCIUM: 9.2 mg/dL (ref 8.9–10.3)
CO2: 23 mmol/L (ref 22–32)
Chloride: 104 mmol/L (ref 101–111)
Creatinine, Ser: 1.17 mg/dL — ABNORMAL HIGH (ref 0.44–1.00)
GFR, EST AFRICAN AMERICAN: 50 mL/min — AB (ref >60)
GFR, EST NON AFRICAN AMERICAN: 43 mL/min — AB (ref >60)
GLUCOSE: 189 mg/dL — AB (ref 65–99)
POTASSIUM: 4.7 mmol/L (ref 3.5–5.1)
SODIUM: 137 mmol/L (ref 135–145)

## 2017-07-11 NOTE — Progress Notes (Addendum)
Primary Care Physician: Kelton Pillar, MD Referring Physician:   Karna Abed Z[place]ito is a 76 y.o. female with a h/o A Flutter in thepast s/p ablation in 2011 with Dr. Caryl Comes, HTN, COPD, DM type 2 controlled, HLD and other comorbids who presentedwith two-week history of dizziness. She states that the room spins around her, causing her to sit down. She has not passed out from this. She was diagnosed with vertigo by her primary care physician and was prescribed meclizine. Despite taking medication, she continuedto be very dizzy  Also admitted to dysuria. Was admitted and found to have SVT with underlying A Flutter and Cardiology evaluated, adjusted her meds and got her of IV drips. Workup also revealed a Klebsiella UTI that was treated with IV Ceftriaxone for 5 days along with Hypothyroidism. She also had Diastolic Heart failure for which se was diuresed and improved.D/c on po lasix. Patient was deemed medically stable and did not require O2 at D/C and will be D/C'd Home with Home Health PT.  She is in the afib clinic today for f/u. She is feeling better. No further dizziness. No awareness of irregular heart beat. Continues loading on amiodarone 200 mg bid. On xarleto 20 mg for chadsvasc score of at least 6.  Today, she denies symptoms of palpitations, chest pain, shortness of breath, orthopnea, PND, lower extremity edema, dizziness, presyncope, syncope, or neurologic sequela. The patient is tolerating medications without difficulties and is otherwise without complaint today.   Past Medical History:  Diagnosis Date  . Arthritis   . Asthma   . COPD (chronic obstructive pulmonary disease) ([place])   . Diabetes mellitus   . Glaucoma   . High cholesterol   . Hypertension    Past Surgical History:  Procedure Laterality Date  . ABDOMINAL HYSTERECTOMY    . HIP SURGERY    . JOINT REPLACEMENT    . left lumpectomy    . REPLACEMENT TOTAL KNEE BILATERAL     2009-Right, 2001-Left  . Right bog toe  fusion    . Right trigger finger surgery      Current Outpatient Prescriptions  Medication Sig Dispense Refill  . amiodarone (PACERONE) 200 MG tablet Take 1 tablet (200 mg total) by mouth 2 (two) times daily. 60 tablet 0  . atorvastatin (LIPITOR) 10 MG tablet Take 1 tablet (10 mg total) by mouth daily at 6 PM. 30 tablet 0  . AZOPT 1 % ophthalmic suspension Place 1 drop into both eyes 2 (two) times daily.  0  . clotrimazole-betamethasone (LOTRISONE) cream Apply 1 application topically daily as needed for irritation. Apply 1 application every day as needed for vaginal irritation  0  . diltiazem (CARDIZEM CD) 240 MG 24 hr capsule Take 1 capsule (240 mg total) by mouth daily. 30 capsule 0  . furosemide (LASIX) 20 MG tablet Take 1 tablet (20 mg total) by mouth daily. 30 tablet 0  . JANUVIA 100 MG tablet Take 100 mg by mouth daily.  0  . levothyroxine (SYNTHROID, LEVOTHROID) 25 MCG tablet Take 1 tablet (25 mcg total) by mouth daily before breakfast. 30 tablet 0  . metFORMIN (GLUCOPHAGE) 850 MG tablet Take 850-1,700 mg by mouth 2 (two) times daily. Take 850mg  by mouth in the morning, and take 1700mg  by mouth in the evening  1  . potassium chloride (K-DUR,KLOR-CON) 10 MEQ tablet Take 1 tablet (10 mEq total) by mouth daily. 30 tablet 0  . rivaroxaban (XARELTO) 20 MG TABS tablet Take 1 tablet (20 mg total)  by mouth daily with supper. 30 tablet 0  . TRAVATAN Z 0.004 % SOLN ophthalmic solution Place 1 drop into both eyes every evening.  0  . valsartan-hydrochlorothiazide (DIOVAN-HCT) 320-25 MG tablet Take 1 tablet by mouth daily.  0  . white petrolatum (VASELINE) GEL Apply 1 application topically as needed for lip care. 16.8 g 0   No current facility-administered medications for this encounter.     Allergies  Allergen Reactions  . Shrimp [Shellfish Allergy] Nausea And Vomiting    Social History   Social History  . Marital status: Single    Spouse name: N/A  . Number of children: N/A  . Years  of education: N/A   Occupational History  . Not on file.   Social History Main Topics  . Smoking status: Never Smoker  . Smokeless tobacco: Never Used  . Alcohol use No  . Drug use: No  . Sexual activity: No   Other Topics Concern  . Not on file   Social History Narrative  . No narrative on file    Family History  Problem Relation Age of Onset  . Diabetes Brother     ROS- All systems are reviewed and negative except as per the HPI above  Physical Exam: Vitals:   07/11/17 0931  BP: (!) 146/56  Pulse: 77  Weight: 158 lb (71.7 kg)  Height: 5\' 1"  (1.549 m)   Wt Readings from Last 3 Encounters:  07/11/17 158 lb (71.7 kg)  07/05/17 153 lb 1.6 oz (69.4 kg)  11/16/10 202 lb (91.6 kg)    Labs: Lab Results  Component Value Date   NA 137 07/11/2017   K 4.7 07/11/2017   CL 104 07/11/2017   CO2 23 07/11/2017   GLUCOSE 189 (H) 07/11/2017   BUN 26 (H) 07/11/2017   CREATININE 1.17 (H) 07/11/2017   CALCIUM 9.2 07/11/2017   PHOS 3.8 07/05/2017   MG 2.0 07/05/2017   Lab Results  Component Value Date   INR 1.16 07/01/2017   Lab Results  Component Value Date   CHOL 146 07/02/2017   HDL 67 07/02/2017   LDLCALC 69 07/02/2017   TRIG 52 07/02/2017     GEN- The patient is well appearing, alert and oriented x 3 today.   Head- normocephalic, atraumatic Eyes-  Sclera clear, conjunctiva pink Ears- hearing intact Oropharynx- clear Neck- supple, no JVP Lymph- no cervical lymphadenopathy Lungs- Clear to ausculation bilaterally, normal work of breathing Heart- Regular rate and rhythm, no murmurs, rubs or gallops, PMI not laterally displaced GI- soft, NT, ND, + BS Extremities- no clubbing, cyanosis, or edema MS- no significant deformity or atrophy Skin- no rash or lesion Psych- euthymic mood, full affect Neuro- strength and sensation are intact  EKG- SR at 77 bpm, pr int 176 ms, qrs int 72 ms, qtc 561 ms (prolonged but acceptable with amiodarone) Epic records reviewed   Echo-Study Conclusions  - Left ventricle: The cavity size was normal. Wall thickness was   increased in a pattern of mild LVH. Systolic function was normal.   The estimated ejection fraction was in the range of 55% to 60%.   Wall motion was normal; there were no regional wall motion   abnormalities. Features are consistent with a pseudonormal left   ventricular filling pattern, with concomitant abnormal relaxation   and increased filling pressure (grade 2 diastolic dysfunction). - Ventricular septum: The contour showed diastolic flattening. - Mitral valve: There was moderate regurgitation. - Left atrium: The atrium was mildly  dilated. - Right ventricle: The cavity size was mildly dilated. Wall   thickness was normal. - Tricuspid valve: There was trivial regurgitation. - Pulmonary arteries: Systolic pressure was severely increased. PA   peak pressure: 81 mm Hg (S).    Assessment and Plan: 1. Atrial flutter Likely typical per Dr. Rayann Heman, he also said that repeat flutter ablation could be considered in the future when more stable In SR today with 200 mg amiodarone bid Continue  cardizem 240 mg a day Will discuss with Dr. Rayann Heman to see how long she will continue 200 mg bid dose of amiodarone  Feels improved  Continue xarelto 20 mg a day  2. Diastolic HF  Weight stable No LLE Denies shortness of breath Continue  lasix 20 mg qd bmet today  F/u with EP in 2-4 weeks  Addendum: Discussed amio load and qtc with Dr. Rayann Heman, will reduce  amio to 200 mg qd from bid  Bmet also showed slight increase in creatinine and BUN, new to  diuretic use, will reduce lasix to 20 mg qod F/u with Dr. Rayann Heman in 3 weeks  Geroge Baseman. Jouri Threat, [facility] [address] [place], [place] [address] [phone]

## 2017-07-12 ENCOUNTER — Other Ambulatory Visit (HOSPITAL_COMMUNITY): Payer: Self-pay | Admitting: *Deleted

## 2017-07-12 MED ORDER — FUROSEMIDE 20 MG PO TABS: 20.0000 mg | ORAL_TABLET | ORAL | 3 refills | Status: DC

## 2017-07-12 MED ORDER — AMIODARONE HCL 200 MG PO TABS: 200.0000 mg | ORAL_TABLET | Freq: Every day | ORAL | 3 refills | Status: DC

## 2017-07-12 NOTE — Addendum Note (Signed)
Encounter addended by: Sherran Needs, NP on: 07/12/2017  4:26 PM<BR>    Actions taken: Sign clinical note

## 2017-08-05 ENCOUNTER — Encounter: Payer: Self-pay | Admitting: Internal Medicine

## 2017-08-05 ENCOUNTER — Telehealth: Payer: Self-pay | Admitting: Internal Medicine

## 2017-08-05 ENCOUNTER — Ambulatory Visit (INDEPENDENT_AMBULATORY_CARE_PROVIDER_SITE_OTHER): Payer: Medicare Other | Admitting: Internal Medicine

## 2017-08-05 VITALS — BP 136/60 | HR 83 | Ht 61.0 in | Wt 158.0 lb

## 2017-08-05 DIAGNOSIS — I10 Essential (primary) hypertension: Secondary | ICD-10-CM

## 2017-08-05 DIAGNOSIS — I483 Typical atrial flutter: Secondary | ICD-10-CM | POA: Diagnosis not present

## 2017-08-05 NOTE — Progress Notes (Signed)
PCP: Kelton Pillar, MD   Lydia Myers is a 76 y.o. female who presents today for routine electrophysiology followup.  Since her recent discharge, the patient reports doing very well.  No symptoms of arrhythmia. Today, she denies symptoms of palpitations, chest pain, shortness of breath,  lower extremity edema, dizziness, presyncope, or syncope.  The patient is otherwise without complaint today.   Past Medical History:  Diagnosis Date  . Arthritis   . Asthma   . COPD (chronic obstructive pulmonary disease) ([place])   . Diabetes mellitus   . Glaucoma   . High cholesterol   . Hypertension    Past Surgical History:  Procedure Laterality Date  . ABDOMINAL HYSTERECTOMY    . HIP SURGERY    . JOINT REPLACEMENT    . left lumpectomy    . REPLACEMENT TOTAL KNEE BILATERAL     2009-Right, 2001-Left  . Right bog toe fusion    . Right trigger finger surgery      ROS- all systems are reviewed and negatives except as per HPI above  Current Outpatient Prescriptions  Medication Sig Dispense Refill  . amiodarone (PACERONE) 200 MG tablet Take 1 tablet (200 mg total) by mouth daily. 30 tablet 3  . atorvastatin (LIPITOR) 10 MG tablet Take 1 tablet (10 mg total) by mouth daily at 6 PM. 30 tablet 0  . AZOPT 1 % ophthalmic suspension Place 1 drop into both eyes 2 (two) times daily.  0  . clotrimazole-betamethasone (LOTRISONE) cream Apply 1 application topically daily as needed for irritation. Apply 1 application every day as needed for vaginal irritation  0  . diltiazem (CARDIZEM CD) 240 MG 24 hr capsule Take 1 capsule (240 mg total) by mouth daily. 30 capsule 0  . JANUVIA 100 MG tablet Take 100 mg by mouth daily.  0  . levothyroxine (SYNTHROID, LEVOTHROID) 25 MCG tablet Take 1 tablet (25 mcg total) by mouth daily before breakfast. 30 tablet 0  . metFORMIN (GLUCOPHAGE) 850 MG tablet Take 850-1,700 mg by mouth 2 (two) times daily. Take 850mg  by mouth in the morning, and take 1700mg  by mouth in the  evening  1  . olmesartan-hydrochlorothiazide (BENICAR HCT) 40-25 MG tablet Take 1 tablet by mouth daily.  12  . potassium chloride (K-DUR,KLOR-CON) 10 MEQ tablet Take 1 tablet (10 mEq total) by mouth daily. 30 tablet 0  . rivaroxaban (XARELTO) 20 MG TABS tablet Take 1 tablet (20 mg total) by mouth daily with supper. 30 tablet 0  . TRAVATAN Z 0.004 % SOLN ophthalmic solution Place 1 drop into both eyes every evening.  0  . white petrolatum (VASELINE) GEL Apply 1 application topically as needed for lip care. 16.8 g 0   No current facility-administered medications for this visit.     Physical Exam: Vitals:   08/05/17 1223  BP: 136/60  Pulse: 83  SpO2: 98%  Weight: 158 lb (71.7 kg)  Height: 5\' 1"  (1.549 m)    GEN- The patient is well appearing, alert and oriented x 3 today.   Head- normocephalic, atraumatic Eyes-  Sclera clear, conjunctiva pink Ears- hearing intact Oropharynx- clear Lungs- Clear to ausculation bilaterally, normal work of breathing Heart- Regular rate and rhythm, no murmurs, rubs or gallops, PMI not laterally displaced GI- soft, NT, ND, + BS Extremities- no clubbing, cyanosis, or edema  EKG tracing ordered today is personally reviewed and shows sinus rhythm 83 bpm, LAA, otherwise normal ekg  Assessment and Plan:  1. Atrial flutter Typical  appearing by ekg I have offered repeat ablation She is clear that she is not interested in any additional CV procedures at this time No changes today  2. HTN Stable No change required today  Return to see me in 69months  Thompson Grayer MD, [facility] 08/05/2017 12:56 PM

## 2017-08-05 NOTE — Patient Instructions (Addendum)

## 2017-08-05 NOTE — Telephone Encounter (Signed)
New message    [place] Ar- Dr. Alfredia Ferguson hospitalist is calling to find out if Dr. Rayann Heman is going to continue to prescribe her heart medication. She said that pt has been out since Sunday.

## 2017-08-06 ENCOUNTER — Other Ambulatory Visit (HOSPITAL_COMMUNITY): Payer: Self-pay | Admitting: *Deleted

## 2017-08-06 MED ORDER — ATORVASTATIN CALCIUM 10 MG PO TABS
10.0000 mg | ORAL_TABLET | Freq: Every day | ORAL | 2 refills | Status: DC
Start: 2017-08-06 — End: 2018-04-21

## 2017-08-06 MED ORDER — RIVAROXABAN 20 MG PO TABS: 20.0000 mg | ORAL_TABLET | Freq: Every day | ORAL | 2 refills | Status: DC

## 2017-08-06 MED ORDER — AMIODARONE HCL 200 MG PO TABS: 200.0000 mg | ORAL_TABLET | Freq: Every day | ORAL | 2 refills | Status: DC

## 2017-08-06 MED ORDER — DILTIAZEM HCL ER COATED BEADS 240 MG PO CP24: 240.0000 mg | ORAL_CAPSULE | Freq: Every day | ORAL | 2 refills | Status: DC

## 2017-08-06 MED ORDER — POTASSIUM CHLORIDE CRYS ER 10 MEQ PO TBCR: 10.0000 meq | EXTENDED_RELEASE_TABLET | Freq: Every day | ORAL | 2 refills | Status: DC

## 2017-08-06 NOTE — Telephone Encounter (Signed)
Refills have been sent to her pharmacy.

## 2017-08-12 ENCOUNTER — Other Ambulatory Visit (HOSPITAL_COMMUNITY): Payer: Self-pay | Admitting: *Deleted

## 2017-08-12 MED ORDER — FUROSEMIDE 20 MG PO TABS: 20.0000 mg | ORAL_TABLET | ORAL | 2 refills | Status: DC

## 2017-11-11 ENCOUNTER — Ambulatory Visit: Payer: Medicare Other | Admitting: Internal Medicine

## 2017-11-11 ENCOUNTER — Encounter: Payer: Self-pay | Admitting: Internal Medicine

## 2017-11-11 VITALS — BP 138/76 | HR 71 | Ht 64.0 in | Wt 154.0 lb

## 2017-11-11 DIAGNOSIS — I10 Essential (primary) hypertension: Secondary | ICD-10-CM

## 2017-11-11 DIAGNOSIS — I481 Persistent atrial fibrillation: Secondary | ICD-10-CM

## 2017-11-11 DIAGNOSIS — I4819 Other persistent atrial fibrillation: Secondary | ICD-10-CM

## 2017-11-11 DIAGNOSIS — I483 Typical atrial flutter: Secondary | ICD-10-CM | POA: Diagnosis not present

## 2017-11-11 NOTE — Progress Notes (Signed)
PCP: Kelton Pillar, MD    Lydia Myers is a 77 y.o. female who presents today for routine electrophysiology followup.  Since last being seen in our clinic, the patient reports doing very well.  Her primary concern is with occasional nausea and poor appetite.  She also has occasional chest heaviness.  Today, she denies symptoms of palpitations, exertional chest pain, shortness of breath,  lower extremity edema, dizziness, presyncope, or syncope.  The patient is otherwise without complaint today.   Past Medical History:  Diagnosis Date  . Arthritis   . Asthma   . COPD (chronic obstructive pulmonary disease) ([place])   . Diabetes mellitus   . Glaucoma   . High cholesterol   . Hypertension    Past Surgical History:  Procedure Laterality Date  . ABDOMINAL HYSTERECTOMY    . HIP SURGERY    . JOINT REPLACEMENT    . left lumpectomy    . REPLACEMENT TOTAL KNEE BILATERAL     2009-Right, 2001-Left  . Right bog toe fusion    . Right trigger finger surgery      ROS- all systems are reviewed and negatives except as per HPI above  Current Outpatient Medications  Medication Sig Dispense Refill  . amiodarone (PACERONE) 200 MG tablet Take 1 tablet (200 mg total) by mouth daily. 90 tablet 2  . atorvastatin (LIPITOR) 10 MG tablet Take 1 tablet (10 mg total) by mouth daily at 6 PM. 90 tablet 2  . AZOPT 1 % ophthalmic suspension Place 1 drop into both eyes 2 (two) times daily.  0  . clotrimazole-betamethasone (LOTRISONE) cream Apply 1 application topically daily as needed for irritation. Apply 1 application every day as needed for vaginal irritation  0  . diltiazem (CARDIZEM CD) 240 MG 24 hr capsule Take 1 capsule (240 mg total) by mouth daily. 90 capsule 2  . JANUVIA 100 MG tablet Take 100 mg by mouth daily.  0  . levothyroxine (SYNTHROID, LEVOTHROID) 25 MCG tablet Take 1 tablet (25 mcg total) by mouth daily before breakfast. 30 tablet 0  . metFORMIN (GLUCOPHAGE) 850 MG tablet Take 850-1,700 mg  by mouth 2 (two) times daily. Take 850mg  by mouth in the morning, and take 1700mg  by mouth in the evening  1  . olmesartan-hydrochlorothiazide (BENICAR HCT) 40-25 MG tablet Take 1 tablet by mouth daily.  12  . potassium chloride (K-DUR,KLOR-CON) 10 MEQ tablet Take 1 tablet (10 mEq total) by mouth daily. 90 tablet 2  . rivaroxaban (XARELTO) 20 MG TABS tablet Take 1 tablet (20 mg total) by mouth daily with supper. 90 tablet 2  . TRAVATAN Z 0.004 % SOLN ophthalmic solution Place 1 drop into both eyes every evening.  0  . white petrolatum (VASELINE) GEL Apply 1 application topically as needed for lip care. 16.8 g 0  . furosemide (LASIX) 20 MG tablet Take 1 tablet (20 mg total) by mouth every other day. 45 tablet 2   No current facility-administered medications for this visit.     Physical Exam: Vitals:   11/11/17 0958  BP: 138/76  Pulse: 71  Weight: 154 lb (69.9 kg)  Height: 5\' 4"  (1.626 m)    GEN- The patient is well appearing, alert and oriented x 3 today.   Head- normocephalic, atraumatic Eyes-  Sclera clear, conjunctiva pink Ears- hearing intact Oropharynx- clear Lungs- Clear to ausculation bilaterally, normal work of breathing Heart- irregular rate and rhythm, no murmurs, rubs or gallops, PMI not laterally displaced GI- soft,  NT, ND, + BS Extremities- no clubbing, cyanosis, or edema  EKG tracing ordered today is personally reviewed and shows probably atrial fibrillation with V rate 71 bpm, cant completely exclude sinus with frequent PACs and artifact  Assessment and Plan:  1. Atrial arrhythmias Atrial flutter (typical) previously documented.  Today, appears to be in afib. Continue rate control as our long term strategy Stop amiodarone today  2. HTN Stable No change required today  3. Chest tightness Likely GI, though I cannot exclude CV cause. I have advised lexiscan myoview which she is clear in her decision to decline at this time.  If her symptoms worsen then she may  be willing to reconsider.  Return to afib clinic every 3 months I will see when needed  Thompson Grayer MD, [facility] 11/11/2017 10:15 AM

## 2017-11-11 NOTE — Patient Instructions (Addendum)
Medication Instructions:  Your physician has recommended you make the following change in your medication: 1. STOP Amiodarone  * If you need a refill on your cardiac medications before your next appointment, please call your pharmacy. *  Labwork: None ordered  Testing/Procedures: None ordered  Follow-Up: Your physician recommends that you schedule a follow-up appointment in: 3 months with Roderic Palau in the AFib clinic.   Thank you for choosing [facility]!!   Trinidad Curet, RN [phone]

## 2018-02-12 ENCOUNTER — Ambulatory Visit (HOSPITAL_COMMUNITY)
Admission: RE | Admit: 2018-02-12 | Discharge: 2018-02-12 | Disposition: A | Discharge status: Home / Self Care | Payer: Medicare Other | Source: Ambulatory Visit | Attending: Nurse Practitioner | Admitting: Nurse Practitioner

## 2018-02-12 ENCOUNTER — Encounter (HOSPITAL_COMMUNITY): Payer: Self-pay | Admitting: Nurse Practitioner

## 2018-02-12 VITALS — BP 150/74 | HR 91 | Ht 64.0 in | Wt 153.4 lb

## 2018-02-12 DIAGNOSIS — Z7901 Long term (current) use of anticoagulants: Secondary | ICD-10-CM | POA: Insufficient documentation

## 2018-02-12 DIAGNOSIS — I491 Atrial premature depolarization: Secondary | ICD-10-CM | POA: Diagnosis not present

## 2018-02-12 DIAGNOSIS — I110 Hypertensive heart disease with heart failure: Secondary | ICD-10-CM | POA: Insufficient documentation

## 2018-02-12 DIAGNOSIS — E7800 Pure hypercholesterolemia, unspecified: Secondary | ICD-10-CM | POA: Insufficient documentation

## 2018-02-12 DIAGNOSIS — I4892 Unspecified atrial flutter: Secondary | ICD-10-CM | POA: Diagnosis present

## 2018-02-12 DIAGNOSIS — Z79899 Other long term (current) drug therapy: Secondary | ICD-10-CM | POA: Diagnosis not present

## 2018-02-12 DIAGNOSIS — Z96653 Presence of artificial knee joint, bilateral: Secondary | ICD-10-CM | POA: Insufficient documentation

## 2018-02-12 DIAGNOSIS — R42 Dizziness and giddiness: Secondary | ICD-10-CM | POA: Insufficient documentation

## 2018-02-12 DIAGNOSIS — E785 Hyperlipidemia, unspecified: Secondary | ICD-10-CM | POA: Diagnosis not present

## 2018-02-12 DIAGNOSIS — I480 Paroxysmal atrial fibrillation: Secondary | ICD-10-CM | POA: Diagnosis not present

## 2018-02-12 DIAGNOSIS — H409 Unspecified glaucoma: Secondary | ICD-10-CM | POA: Insufficient documentation

## 2018-02-12 DIAGNOSIS — J449 Chronic obstructive pulmonary disease, unspecified: Secondary | ICD-10-CM | POA: Diagnosis not present

## 2018-02-12 DIAGNOSIS — I5030 Unspecified diastolic (congestive) heart failure: Secondary | ICD-10-CM | POA: Insufficient documentation

## 2018-02-12 DIAGNOSIS — E119 Type 2 diabetes mellitus without complications: Secondary | ICD-10-CM | POA: Insufficient documentation

## 2018-02-12 DIAGNOSIS — Z9889 Other specified postprocedural states: Secondary | ICD-10-CM | POA: Insufficient documentation

## 2018-02-12 DIAGNOSIS — Z7984 Long term (current) use of oral hypoglycemic drugs: Secondary | ICD-10-CM | POA: Insufficient documentation

## 2018-02-12 LAB — BASIC METABOLIC PANEL
ANION GAP: 12 (ref 5–15)
BUN: 17 mg/dL (ref 6–20)
CALCIUM: 9.4 mg/dL (ref 8.9–10.3)
CHLORIDE: 106 mmol/L (ref 101–111)
CO2: 23 mmol/L (ref 22–32)
Creatinine, Ser: 1.17 mg/dL — ABNORMAL HIGH (ref 0.44–1.00)
GFR calc Af Amer: 50 mL/min — ABNORMAL LOW (ref >60)
GFR calc non Af Amer: 43 mL/min — ABNORMAL LOW (ref >60)
GLUCOSE: 107 mg/dL — AB (ref 65–99)
POTASSIUM: 3.7 mmol/L (ref 3.5–5.1)
Sodium: 141 mmol/L (ref 135–145)

## 2018-02-13 ENCOUNTER — Other Ambulatory Visit (HOSPITAL_COMMUNITY): Payer: Self-pay | Admitting: *Deleted

## 2018-02-13 MED ORDER — RIVAROXABAN 15 MG PO TABS: 15.0000 mg | ORAL_TABLET | Freq: Every day | ORAL | 6 refills | Status: DC

## 2018-02-13 NOTE — Progress Notes (Signed)
Primary Care Physician: Kelton Pillar, MD Referring Physician: Dr. Juanell Fairly Asencio is a 77 y.o. female with a h/o A Flutter in thepast s/p ablation in 2011 with Dr. Caryl Comes, HTN, COPD, DM type 2 controlled, HLD and other comorbids who presentedwith two-week history of dizziness. She states that the room spins around her, causing her to sit down. She has not passed out from this. She was diagnosed with vertigo by her primary care physician and was prescribed meclizine. Despite taking medication, she continuedto be very dizzy  Remote admisssion, 06/2017, and found to have SVT with underlying A Flutter and Cardiology evaluated, adjusted her meds and got her off IV drips. Workup also revealed a Klebsiella UTI that was treated with IV Ceftriaxone for 5 days,  along with Hypothyroidism. She also had Diastolic Heart failure for which se was diuresed and improved.D/c on po lasix. Patient was deemed medically stable and did not require O2 at D/C and will be D/C'd Home with Home Health PT.  She had f/u  in the afib clinic 07/2017 for f/u. She was  feeling better. No further dizziness. No awareness of irregular heart beat. Continued loading on amiodarone 200 mg bid. On xarleto 20 mg for chadsvasc score of at least 6.  Saw Dr. Rayann Heman last in January of this year and was in afib. Amiodarone was stopped and rate control was the plan. SHe was to f/u in afib clinic.  In afib clinic, 02/12/18, she is in [place]. She feels well. No LLE. She continues to c/o of some chest tightness that does not appear to be exertional but more so present all the time to some degree. She mentioned this to Dr.Allred in January. He offered a stress test but she declined, She declines as well today. The chest discomfort can be reproduced with palpation along the upper chest wall, possibly more MS in origin..  Today, she denies symptoms of palpitations,   shortness of breath, orthopnea, PND, lower extremity edema, dizziness,  presyncope, syncope, or neurologic sequela.+ for chronic chest tightness. The patient is tolerating medications without difficulties and is otherwise without complaint today.   Past Medical History:  Diagnosis Date  . Arthritis   . Asthma   . COPD (chronic obstructive pulmonary disease) ([place])   . Diabetes mellitus   . Glaucoma   . High cholesterol   . Hypertension    Past Surgical History:  Procedure Laterality Date  . ABDOMINAL HYSTERECTOMY    . HIP SURGERY    . JOINT REPLACEMENT    . left lumpectomy    . REPLACEMENT TOTAL KNEE BILATERAL     2009-Right, 2001-Left  . Right bog toe fusion    . Right trigger finger surgery      Current Outpatient Medications  Medication Sig Dispense Refill  . atorvastatin (LIPITOR) 10 MG tablet Take 1 tablet (10 mg total) by mouth daily at 6 PM. 90 tablet 2  . AZOPT 1 % ophthalmic suspension Place 1 drop into both eyes 2 (two) times daily.  0  . clotrimazole-betamethasone (LOTRISONE) cream Apply 1 application topically daily as needed for irritation. Apply 1 application every day as needed for vaginal irritation  0  . diltiazem (CARDIZEM CD) 240 MG 24 hr capsule Take 1 capsule (240 mg total) by mouth daily. 90 capsule 2  . furosemide (LASIX) 20 MG tablet Take 1 tablet (20 mg total) by mouth every other day. 45 tablet 2  . JANUVIA 100 MG tablet Take 100 mg  by mouth daily.  0  . levothyroxine (SYNTHROID, LEVOTHROID) 25 MCG tablet Take 1 tablet (25 mcg total) by mouth daily before breakfast. 30 tablet 0  . metFORMIN (GLUCOPHAGE) 850 MG tablet Take 850-1,700 mg by mouth 2 (two) times daily. Take 850mg  by mouth in the morning, and take 1700mg  by mouth in the evening  1  . olmesartan-hydrochlorothiazide (BENICAR HCT) 40-25 MG tablet Take 1 tablet by mouth daily.  12  . potassium chloride (K-DUR,KLOR-CON) 10 MEQ tablet Take 1 tablet (10 mEq total) by mouth daily. 90 tablet 2  . rivaroxaban (XARELTO) 20 MG TABS tablet Take 1 tablet (20 mg total) by mouth  daily with supper. 90 tablet 2  . TRAVATAN Z 0.004 % SOLN ophthalmic solution Place 1 drop into both eyes every evening.  0  . white petrolatum (VASELINE) GEL Apply 1 application topically as needed for lip care. 16.8 g 0   No current facility-administered medications for this encounter.     Allergies  Allergen Reactions  . Shrimp [Shellfish Allergy] Nausea And Vomiting    Social History   Socioeconomic History  . Marital status: Single    Spouse name: Not on file  . Number of children: Not on file  . Years of education: Not on file  . Highest education level: Not on file  Occupational History  . Not on file  Social Needs  . Financial resource strain: Not on file  . Food insecurity:    Worry: Not on file    Inability: Not on file  . Transportation needs:    Medical: Not on file    Non-medical: Not on file  Tobacco Use  . Smoking status: Never Smoker  . Smokeless tobacco: Never Used  Substance and Sexual Activity  . Alcohol use: No  . Drug use: No  . Sexual activity: Never  Lifestyle  . Physical activity:    Days per week: Not on file    Minutes per session: Not on file  . Stress: Not on file  Relationships  . Social connections:    Talks on phone: Not on file    Gets together: Not on file    Attends religious service: Not on file    Active member of club or organization: Not on file    Attends meetings of clubs or organizations: Not on file    Relationship status: Not on file  . Intimate partner violence:    Fear of current or ex partner: Not on file    Emotionally abused: Not on file    Physically abused: Not on file    Forced sexual activity: Not on file  Other Topics Concern  . Not on file  Social History Narrative  . Not on file    Family History  Problem Relation Age of Onset  . Diabetes Brother     ROS- All systems are reviewed and negative except as per the HPI above  Physical Exam: Vitals:   02/12/18 0858  BP: (!) 150/74  Pulse: 91    SpO2: 90%  Weight: 153 lb 6.4 oz (69.6 kg)  Height: 5\' 4"  (1.626 m)   Wt Readings from Last 3 Encounters:  02/12/18 153 lb 6.4 oz (69.6 kg)  11/11/17 154 lb (69.9 kg)  08/05/17 158 lb (71.7 kg)    Labs: Lab Results  Component Value Date   NA 141 02/12/2018   K 3.7 02/12/2018   CL 106 02/12/2018   CO2 23 02/12/2018   GLUCOSE 107 (H)  02/12/2018   BUN 17 02/12/2018   CREATININE 1.17 (H) 02/12/2018   CALCIUM 9.4 02/12/2018   PHOS 3.8 07/05/2017   MG 2.0 07/05/2017   Lab Results  Component Value Date   INR 1.16 07/01/2017   Lab Results  Component Value Date   CHOL 146 07/02/2017   HDL 67 07/02/2017   LDLCALC 69 07/02/2017   TRIG 52 07/02/2017     GEN- The patient is well appearing, alert and oriented x 3 today.   Head- normocephalic, atraumatic Eyes-  Sclera clear, conjunctiva pink Ears- hearing intact Oropharynx- clear Neck- supple, no JVP Lymph- no cervical lymphadenopathy Lungs- Clear to ausculation bilaterally, normal work of breathing Heart- Regular rate and rhythm, no murmurs, rubs or gallops, PMI not laterally displaced GI- soft, NT, ND, + BS Extremities- no clubbing, cyanosis, or edema MS- no significant deformity or atrophy Skin- no rash or lesion Psych- euthymic mood, full affect Neuro- strength and sensation are intact  EKG- SR at 77 bpm, pr int 176 ms, qrs int 72 ms, qtc 561 ms (prolonged but acceptable with amiodarone) Epic records reviewed  Echo-Study Conclusions  - Left ventricle: The cavity size was normal. Wall thickness was   increased in a pattern of mild LVH. Systolic function was normal.   The estimated ejection fraction was in the range of 55% to 60%.   Wall motion was normal; there were no regional wall motion   abnormalities. Features are consistent with a pseudonormal left   ventricular filling pattern, with concomitant abnormal relaxation   and increased filling pressure (grade 2 diastolic dysfunction). - Ventricular septum: The  contour showed diastolic flattening. - Mitral valve: There was moderate regurgitation. - Left atrium: The atrium was mildly dilated. - Right ventricle: The cavity size was mildly dilated. Wall   thickness was normal. - Tricuspid valve: There was trivial regurgitation. - Pulmonary arteries: Systolic pressure was severely increased. PA   peak pressure: 81 mm Hg (S).    Assessment and Plan: 1. Atrial fib flutter Off amiodarone and in SR today Continue  cardizem 240 mg a day Feels improved  Bmet today shows a crcl cal at 43.43 Therefore will change xarelto 20 mg to 15 mg daily for chadsvasc score of at least 5  2. Diastolic HF  Weight stable No LLE Denies shortness of breath Continue  lasix 20 mg qod bmet today  F/u with afib clinic in 3 months   Butch Penny C. Christyne Mccain, [facility] [address] [place], [place] [address] [phone]

## 2018-04-07 ENCOUNTER — Other Ambulatory Visit (HOSPITAL_COMMUNITY): Payer: Self-pay | Admitting: Nurse Practitioner

## 2018-04-21 ENCOUNTER — Other Ambulatory Visit (HOSPITAL_COMMUNITY): Payer: Self-pay | Admitting: Nurse Practitioner

## 2018-05-13 ENCOUNTER — Ambulatory Visit (HOSPITAL_COMMUNITY): Payer: Medicare Other | Admitting: Nurse Practitioner

## 2018-07-21 ENCOUNTER — Other Ambulatory Visit (HOSPITAL_COMMUNITY): Payer: Self-pay | Admitting: Nurse Practitioner

## 2018-07-31 ENCOUNTER — Ambulatory Visit (HOSPITAL_COMMUNITY)
Admission: RE | Admit: 2018-07-31 | Discharge: 2018-07-31 | Disposition: A | Discharge status: Home / Self Care | Payer: Medicare Other | Source: Ambulatory Visit | Attending: Nurse Practitioner | Admitting: Nurse Practitioner

## 2018-07-31 ENCOUNTER — Encounter (HOSPITAL_COMMUNITY): Payer: Self-pay | Admitting: Nurse Practitioner

## 2018-07-31 VITALS — BP 162/98 | HR 82 | Ht 64.0 in | Wt 161.0 lb

## 2018-07-31 DIAGNOSIS — Z91013 Allergy to seafood: Secondary | ICD-10-CM | POA: Insufficient documentation

## 2018-07-31 DIAGNOSIS — I4892 Unspecified atrial flutter: Secondary | ICD-10-CM | POA: Diagnosis not present

## 2018-07-31 DIAGNOSIS — Z79890 Hormone replacement therapy (postmenopausal): Secondary | ICD-10-CM | POA: Diagnosis not present

## 2018-07-31 DIAGNOSIS — Z96653 Presence of artificial knee joint, bilateral: Secondary | ICD-10-CM | POA: Insufficient documentation

## 2018-07-31 DIAGNOSIS — I4891 Unspecified atrial fibrillation: Secondary | ICD-10-CM | POA: Insufficient documentation

## 2018-07-31 DIAGNOSIS — Z79899 Other long term (current) drug therapy: Secondary | ICD-10-CM | POA: Diagnosis not present

## 2018-07-31 DIAGNOSIS — M1990 Unspecified osteoarthritis, unspecified site: Secondary | ICD-10-CM | POA: Diagnosis not present

## 2018-07-31 DIAGNOSIS — H409 Unspecified glaucoma: Secondary | ICD-10-CM | POA: Insufficient documentation

## 2018-07-31 DIAGNOSIS — E119 Type 2 diabetes mellitus without complications: Secondary | ICD-10-CM | POA: Insufficient documentation

## 2018-07-31 DIAGNOSIS — Z7984 Long term (current) use of oral hypoglycemic drugs: Secondary | ICD-10-CM | POA: Diagnosis not present

## 2018-07-31 DIAGNOSIS — E7800 Pure hypercholesterolemia, unspecified: Secondary | ICD-10-CM | POA: Diagnosis not present

## 2018-07-31 DIAGNOSIS — Z7901 Long term (current) use of anticoagulants: Secondary | ICD-10-CM | POA: Insufficient documentation

## 2018-07-31 DIAGNOSIS — I480 Paroxysmal atrial fibrillation: Secondary | ICD-10-CM | POA: Diagnosis not present

## 2018-07-31 DIAGNOSIS — J449 Chronic obstructive pulmonary disease, unspecified: Secondary | ICD-10-CM | POA: Diagnosis not present

## 2018-07-31 DIAGNOSIS — I5032 Chronic diastolic (congestive) heart failure: Secondary | ICD-10-CM | POA: Diagnosis not present

## 2018-07-31 DIAGNOSIS — I110 Hypertensive heart disease with heart failure: Secondary | ICD-10-CM | POA: Diagnosis not present

## 2018-07-31 NOTE — Progress Notes (Signed)
Primary Care Physician: Kelton Pillar, MD Referring Physician: Dr. Levora Angel Lydia Myers is Lydia 77 y.o. female with Lydia h/o Lydia Flutter in thepast s/p ablation in 2011 with Dr. Caryl Comes, HTN, COPD, DM type 2 controlled, with h/o of longstanding afib in the afib clinic for f/u.   She had f/u  in the afib clinic 07/2017 for f/u. She was  feeling better. No further dizziness. No awareness of irregular heart beat. She was  loading on amiodarone 200 mg bid. On xarleto 20 mg for chadsvasc score of at least 6.  Saw Dr. Rayann Heman last in January of this year and was in afib. Amiodarone was stopped and rate control was the plan. SHe was to f/u in afib clinic.  In afib clinic, 02/12/18, she was  in [place]. She felt well. No LLE. She had some chest tightness that did  not appear to be exertional but more so present all the time to some degree. She mentioned this to Dr.Allred in January. He offered Lydia stress test but she declined, She declines as well today. The chest discomfort can be reproduced with palpation along the upper chest wall, possibly more MS in origin.   F/u in afib clinic 9/26. She is in rate controlled afib and feels well.  No c/o of chest tightness or arrhythmia. She feels she is doing very well. No bleeding issues.  Today, she denies symptoms of palpitations,   shortness of breath, orthopnea, PND, lower extremity edema, dizziness, presyncope, syncope, or neurologic sequela. The patient is tolerating medications without difficulties and is otherwise without complaint today.   Past Medical History:  Diagnosis Date  . Arthritis   . Asthma   . COPD (chronic obstructive pulmonary disease) ([place])   . Diabetes mellitus   . Glaucoma   . High cholesterol   . Hypertension    Past Surgical History:  Procedure Laterality Date  . ABDOMINAL HYSTERECTOMY    . HIP SURGERY    . JOINT REPLACEMENT    . left lumpectomy    . REPLACEMENT TOTAL KNEE BILATERAL     2009-Right, 2001-Left  . Right bog toe fusion     . Right trigger finger surgery      Current Outpatient Medications  Medication Sig Dispense Refill  . atorvastatin (LIPITOR) 10 MG tablet TAKE 1 TABLET(10 MG) BY MOUTH DAILY AT 6 PM 90 tablet 0  . AZOPT 1 % ophthalmic suspension Place 1 drop into both eyes 2 (two) times daily.  0  . CARTIA XT 240 MG 24 hr capsule TAKE 1 CAPSULE(240 MG) BY MOUTH DAILY 90 capsule 0  . clotrimazole-betamethasone (LOTRISONE) cream Apply 1 application topically daily as needed for irritation. Apply 1 application every day as needed for vaginal irritation  0  . furosemide (LASIX) 20 MG tablet Take 1 tablet (20 mg total) by mouth every other day. (Patient taking differently: Take 20 mg by mouth every 3 (three) days. ) 45 tablet 2  . JANUVIA 100 MG tablet Take 100 mg by mouth daily.  0  . levothyroxine (SYNTHROID, LEVOTHROID) 25 MCG tablet Take 1 tablet (25 mcg total) by mouth daily before breakfast. 30 tablet 0  . loratadine (CLARITIN) 10 MG tablet Take 10 mg by mouth daily.    . metFORMIN (GLUCOPHAGE) 850 MG tablet Take 850-1,700 mg by mouth 2 (two) times daily. Take 850mg  by mouth in the morning, and take 1700mg  by mouth in the evening  1  . olmesartan-hydrochlorothiazide (BENICAR HCT) 40-25 MG  tablet Take 1 tablet by mouth daily.  12  . potassium chloride (K-DUR,KLOR-CON) 10 MEQ tablet TAKE 1 TABLET(10 MEQ) BY MOUTH DAILY 90 tablet 0  . rivaroxaban (XARELTO) 15 MG TABS tablet Take 1 tablet (15 mg total) by mouth daily with supper. 30 tablet 6  . TRAVATAN Z 0.004 % SOLN ophthalmic solution Place 1 drop into both eyes every evening.  0  . white petrolatum (VASELINE) GEL Apply 1 application topically as needed for lip care. 16.8 g 0   No current facility-administered medications for this encounter.     Allergies  Allergen Reactions  . Shrimp [Shellfish Allergy] Nausea And Vomiting    Social History   Socioeconomic History  . Marital status: Single    Spouse name: Not on file  . Number of children: Not on  file  . Years of education: Not on file  . Highest education level: Not on file  Occupational History  . Not on file  Social Needs  . Financial resource strain: Not on file  . Food insecurity:    Worry: Not on file    Inability: Not on file  . Transportation needs:    Medical: Not on file    Non-medical: Not on file  Tobacco Use  . Smoking status: Never Smoker  . Smokeless tobacco: Never Used  Substance and Sexual Activity  . Alcohol use: No  . Drug use: No  . Sexual activity: Never  Lifestyle  . Physical activity:    Days per week: Not on file    Minutes per session: Not on file  . Stress: Not on file  Relationships  . Social connections:    Talks on phone: Not on file    Gets together: Not on file    Attends religious service: Not on file    Active member of club or organization: Not on file    Attends meetings of clubs or organizations: Not on file    Relationship status: Not on file  . Intimate partner violence:    Fear of current or ex partner: Not on file    Emotionally abused: Not on file    Physically abused: Not on file    Forced sexual activity: Not on file  Other Topics Concern  . Not on file  Social History Narrative  . Not on file    Family History  Problem Relation Age of Onset  . Diabetes Brother     ROS- All systems are reviewed and negative except as per the HPI above  Physical Exam: Vitals:   07/31/18 0836  BP: (!) 162/98  Pulse: 82  Weight: 73 kg  Height: 5\' 4"  (1.626 m)   Wt Readings from Last 3 Encounters:  07/31/18 73 kg  02/12/18 69.6 kg  11/11/17 69.9 kg    Labs: Lab Results  Component Value Date   NA 141 02/12/2018   K 3.7 02/12/2018   CL 106 02/12/2018   CO2 23 02/12/2018   GLUCOSE 107 (H) 02/12/2018   BUN 17 02/12/2018   CREATININE 1.17 (H) 02/12/2018   CALCIUM 9.4 02/12/2018   PHOS 3.8 07/05/2017   MG 2.0 07/05/2017   Lab Results  Component Value Date   INR 1.16 07/01/2017   Lab Results  Component Value  Date   CHOL 146 07/02/2017   HDL 67 07/02/2017   LDLCALC 69 07/02/2017   TRIG 52 07/02/2017     GEN- The patient is well appearing, alert and oriented x 3 today.  Head- normocephalic, atraumatic Eyes-  Sclera clear, conjunctiva pink Ears- hearing intact Oropharynx- clear Neck- supple, no JVP Lymph- no cervical lymphadenopathy Lungs- Clear to ausculation bilaterally, normal work of breathing Heart- irregular rate and rhythm, no murmurs, rubs or gallops, PMI not laterally displaced GI- soft, NT, ND, + BS Extremities- no clubbing, cyanosis, or edema MS- no significant deformity or atrophy Skin- no rash or lesion Psych- euthymic mood, full affect Neuro- strength and sensation are intact  EKG- afib at 81 bpm, qrs int 76 ms, qtc 462 ms Epic records reviewed  Echo-Study Conclusions  - Left ventricle: The cavity size was normal. Wall thickness was   increased in Lydia pattern of mild LVH. Systolic function was normal.   The estimated ejection fraction was in the range of 55% to 60%.   Wall motion was normal; there were no regional wall motion   abnormalities. Features are consistent with Lydia pseudonormal left   ventricular filling pattern, with concomitant abnormal relaxation   and increased filling pressure (grade 2 diastolic dysfunction). - Ventricular septum: The contour showed diastolic flattening. - Mitral valve: There was moderate regurgitation. - Left atrium: The atrium was mildly dilated. - Right ventricle: The cavity size was mildly dilated. Wall   thickness was normal. - Tricuspid valve: There was trivial regurgitation. - Pulmonary arteries: Systolic pressure was severely increased. PA   peak pressure: 81 mm Hg (S).    Assessment and Plan: 1. Atrial fib flutter Rate controlled which is her treatment plan going forward She appears to be asymptomatic  Continue cardizem 240 mg Lydia day Feels improved  Continue xarelto 15 mg daily for chadsvasc score of at least 5  2. H/o  diastolic HF  Weight stable No LLE Denies shortness of breath Continue  lasix 20 mg  every 3 days   F/u with afib clinic in 6 months   Butch Penny C. Osa Campoli, [facility] [address] [place], [place] [address] [phone]

## 2018-08-25 ENCOUNTER — Other Ambulatory Visit (HOSPITAL_COMMUNITY): Payer: Self-pay | Admitting: Nurse Practitioner

## 2018-09-23 ENCOUNTER — Other Ambulatory Visit (HOSPITAL_COMMUNITY): Payer: Self-pay | Admitting: Nurse Practitioner

## 2018-10-17 ENCOUNTER — Other Ambulatory Visit (HOSPITAL_COMMUNITY): Payer: Self-pay | Admitting: Nurse Practitioner

## 2018-10-20 ENCOUNTER — Other Ambulatory Visit (HOSPITAL_COMMUNITY): Payer: Self-pay | Admitting: Nurse Practitioner

## 2019-01-19 ENCOUNTER — Other Ambulatory Visit (HOSPITAL_COMMUNITY): Payer: Self-pay | Admitting: Nurse Practitioner

## 2019-02-02 ENCOUNTER — Other Ambulatory Visit: Payer: Self-pay

## 2019-02-02 ENCOUNTER — Ambulatory Visit (HOSPITAL_COMMUNITY)
Admission: RE | Admit: 2019-02-02 | Discharge: 2019-02-02 | Disposition: A | Discharge status: Home / Self Care | Payer: Medicare Other | Source: Ambulatory Visit | Attending: Nurse Practitioner | Admitting: Nurse Practitioner

## 2019-02-02 DIAGNOSIS — I4821 Permanent atrial fibrillation: Secondary | ICD-10-CM | POA: Diagnosis not present

## 2019-02-02 NOTE — Progress Notes (Signed)
Electrophysiology TeleHealth Note   Due to national recommendations of social distancing due to [place] 19, audio telehealth visit is felt to be most appropriate for this patient at this time.  See MyChart message/consent below  from today for patient consent regarding telehealth for the [facility].    Date:  02/02/2019   ID:  Lydia Myers, DOB 17-Dec-1940, MRN [id number]  Location: home  Provider location: [address] [place], [place] [address] Evaluation Performed: New patient consult/Follow up   PCP:  Kelton Pillar, MD  Primary Cardiologist:  none  Primary Electrophysiologist:Dr. Allred  CC: permament  afib   History of Present Illness: Lydia Myers is a 78 y.o. female who presents via audio  conferencing for a telehealth visit today.   The patient is referred for new consultation regarding  permanent afib. She reports today that her HR runs mostly in the 70's, specifically 78 bpm this am with a BP of 148/91.She is not aware of any spells of heart racing. She feels well. She does not have any pedal edema, takes lasix 3x a week. Occassionally has some thigh soreness and pharmacist said it may be her statin. Noi bleeding issues with xarelto.  Today, she denies symptoms of palpitations, chest pain, shortness of breath, orthopnea, PND, lower extremity edema, claudication, dizziness, presyncope, syncope, bleeding, or neurologic sequela. The patient is tolerating medications without difficulties and is otherwise without complaint today.   she denies symptoms of cough, fevers, chills, or new SOB worrisome for COVID 19.     Past Medical History:  Diagnosis Date  . Arthritis   . Asthma   . COPD (chronic obstructive pulmonary disease) ([place])   . Diabetes mellitus   . Glaucoma   . High cholesterol   . Hypertension    Past Surgical History:  Procedure Laterality Date  . ABDOMINAL HYSTERECTOMY    . HIP SURGERY    . JOINT REPLACEMENT    . left lumpectomy     . REPLACEMENT TOTAL KNEE BILATERAL     2009-Right, 2001-Left  . Right bog toe fusion    . Right trigger finger surgery       Current Outpatient Medications  Medication Sig Dispense Refill  . atorvastatin (LIPITOR) 10 MG tablet TAKE 1 TABLET BY MOUTH DAILY AT 6PM 90 tablet 0  . AZOPT 1 % ophthalmic suspension Place 1 drop into both eyes 2 (two) times daily.  0  . clotrimazole-betamethasone (LOTRISONE) cream Apply 1 application topically daily as needed for irritation. Apply 1 application every day as needed for vaginal irritation  0  . diltiazem (CARDIZEM CD) 240 MG 24 hr capsule TAKE ONE CAPSULE BY MOUTH EVERY DAY 90 capsule 0  . furosemide (LASIX) 20 MG tablet Take 1 tablet (20 mg total) by mouth every other day. (Patient taking differently: Take 20 mg by mouth every 3 (three) days. ) 45 tablet 2  . furosemide (LASIX) 20 MG tablet Take 1 tablet by mouth every 3 days. 15 tablet 6  . JANUVIA 100 MG tablet Take 100 mg by mouth daily.  0  . KLOR-CON M10 10 MEQ tablet TAKE 1 TABLET BY MOUTH EVERY DAY 90 tablet 0  . levothyroxine (SYNTHROID, LEVOTHROID) 25 MCG tablet Take 1 tablet (25 mcg total) by mouth daily before breakfast. 30 tablet 0  . loratadine (CLARITIN) 10 MG tablet Take 10 mg by mouth daily.    . metFORMIN (GLUCOPHAGE) 850 MG tablet Take 850-1,700 mg by mouth 2 (two)  times daily. Take 850mg  by mouth in the morning, and take 1700mg  by mouth in the evening  1  . olmesartan-hydrochlorothiazide (BENICAR HCT) 40-25 MG tablet Take 1 tablet by mouth daily.  12  . TRAVATAN Z 0.004 % SOLN ophthalmic solution Place 1 drop into both eyes every evening.  0  . white petrolatum (VASELINE) GEL Apply 1 application topically as needed for lip care. 16.8 g 0  . XARELTO 15 MG TABS tablet TAKE 1 TABLET BY MOUTH EVERY DAY WITH DINNER 30 tablet 6   No current facility-administered medications for this encounter.     Allergies:   Shrimp [shellfish allergy]   Social History:  The patient  reports that  she has never smoked. She has never used smokeless tobacco. She reports that she does not drink alcohol or use drugs.   Family History:  The patient's  family history includes Diabetes in her brother.    ROS:  Please see the history of present illness.   All other systems are personally reviewed and negative.   Exam: N/A  Recent Labs: 02/12/2018: BUN 17; Creatinine, Ser 1.17; Potassium 3.7; Sodium 141  personally reviewed      ASSESSMENT AND PLAN:  1.Permanent atrial fibrillation Pt sounds to be well rate controlled  She feels well at her current baseline No  change in treatment  This patients CHA2DS2-VASc Score and unadjusted Ischemic Stroke Rate (% per year) is equal to 7.2 % stroke rate/year from a score of 5  Above score calculated as 1 point each if present [CHF, HTN, DM, Vascular=MI/PAD/Aortic Plaque, Age if 65-74, or Female] Above score calculated as 2 points each if present [Age > 75, or Stroke/TIA/TE]  2.Htn Stable  3. Intermittent thigh muscle soreness  She could try a statin holiday to see if any improvement for 1-2   COVID screen The patient does not have any symptoms that suggest any further testing/ screening at this time.  Social distancing reinforced today.   Follow-up:   6 months  Current medicines are reviewed at length with the patient today.   The patient does not have concerns regarding her medicines.  The following changes were made today:  none  Labs/ tests ordered today include: none No orders of the defined types were placed in this encounter.   Patient Risk:  after full review of this patients clinical status, I feel that they are at mod risk at this time.   Today, I have spent 20 minutes with the patient with telehealth technology discussing afib management and medications.  Eduard Roux NP  02/02/2019 10:23 AM  [facility] [address] [place], [place] [address] [phone]   I hereby  voluntarily request, consent and authorize the [facility] and its employed or contracted physicians, physician assistants, nurse practitioners or other licensed health care professionals (the Practitioner), to provide me with telemedicine health care services (the "Services") as deemed necessary by the treating Practitioner. I acknowledge and consent to receive the Services by the Practitioner via telemedicine. I understand that the telemedicine visit will involve communicating with the Practitioner through live audiovisual communication technology and the disclosure of certain medical information by electronic transmission. I acknowledge that I have been given the opportunity to request an in-person assessment or other available alternative prior to the telemedicine visit and am voluntarily participating in the telemedicine visit.   I understand that I have the right to withhold or withdraw my consent to the use of telemedicine  in the course of my care at any time, without affecting my right to future care or treatment, and that the Practitioner or I may terminate the telemedicine visit at any time. I understand that I have the right to inspect all information obtained and/or recorded in the course of the telemedicine visit and may receive copies of available information for a reasonable fee.  I understand that some of the potential risks of receiving the Services via telemedicine include:   Delay or interruption in medical evaluation due to technological equipment failure or disruption;  Information transmitted may not be sufficient (e.g. poor resolution of images) to allow for appropriate medical decision making by the Practitioner; and/or  In rare instances, security protocols could fail, causing a breach of personal health information.   Furthermore, I acknowledge that it is my responsibility to provide information about my medical history, conditions and care that is complete and  accurate to the best of my ability. I acknowledge that Practitioner's advice, recommendations, and/or decision may be based on factors not within their control, such as incomplete or inaccurate data provided by me or distortions of diagnostic images or specimens that may result from electronic transmissions. I understand that the practice of medicine is not an exact science and that Practitioner makes no warranties or guarantees regarding treatment outcomes. I acknowledge that I will receive a copy of this consent concurrently upon execution via email to the email address I last provided but may also request a printed copy by calling the office of the [facility].  I understand that my insurance will be billed for this visit.   I have read or had this consent read to me.  I understand the contents of this consent, which adequately explains the benefits and risks of the Services being provided via telemedicine.  I have been provided ample opportunity to ask questions regarding this consent and the Services and have had my questions answered to my satisfaction.  I give my informed consent for the services to be provided through the use of telemedicine in my medical care  By participating in this telemedicine visit I agree to the above.

## 2019-03-02 ENCOUNTER — Other Ambulatory Visit (HOSPITAL_COMMUNITY): Payer: Self-pay | Admitting: Nurse Practitioner

## 2019-03-05 ENCOUNTER — Other Ambulatory Visit (HOSPITAL_COMMUNITY): Payer: Self-pay | Admitting: *Deleted

## 2019-03-05 MED ORDER — RIVAROXABAN 15 MG PO TABS: ORAL_TABLET | ORAL | 6 refills | Status: DC

## 2019-04-06 ENCOUNTER — Other Ambulatory Visit (HOSPITAL_COMMUNITY): Payer: Self-pay | Admitting: Nurse Practitioner

## 2019-05-30 ENCOUNTER — Other Ambulatory Visit (HOSPITAL_COMMUNITY): Payer: Self-pay | Admitting: Nurse Practitioner

## 2019-06-01 ENCOUNTER — Other Ambulatory Visit (HOSPITAL_COMMUNITY): Payer: Self-pay | Admitting: Nurse Practitioner

## 2019-07-31 ENCOUNTER — Other Ambulatory Visit: Payer: Self-pay

## 2019-07-31 ENCOUNTER — Encounter (HOSPITAL_COMMUNITY): Payer: Self-pay | Admitting: Nurse Practitioner

## 2019-07-31 ENCOUNTER — Ambulatory Visit (HOSPITAL_COMMUNITY)
Admission: RE | Admit: 2019-07-31 | Discharge: 2019-07-31 | Disposition: A | Discharge status: Home / Self Care | Payer: Medicare Other | Source: Ambulatory Visit | Attending: Nurse Practitioner | Admitting: Nurse Practitioner

## 2019-07-31 VITALS — BP 170/86 | HR 92 | Ht 64.0 in | Wt 166.6 lb

## 2019-07-31 DIAGNOSIS — Z7984 Long term (current) use of oral hypoglycemic drugs: Secondary | ICD-10-CM | POA: Insufficient documentation

## 2019-07-31 DIAGNOSIS — Z833 Family history of diabetes mellitus: Secondary | ICD-10-CM | POA: Diagnosis not present

## 2019-07-31 DIAGNOSIS — I4821 Permanent atrial fibrillation: Secondary | ICD-10-CM | POA: Diagnosis present

## 2019-07-31 DIAGNOSIS — E1139 Type 2 diabetes mellitus with other diabetic ophthalmic complication: Secondary | ICD-10-CM | POA: Diagnosis not present

## 2019-07-31 DIAGNOSIS — H42 Glaucoma in diseases classified elsewhere: Secondary | ICD-10-CM | POA: Insufficient documentation

## 2019-07-31 DIAGNOSIS — Z79899 Other long term (current) drug therapy: Secondary | ICD-10-CM | POA: Diagnosis not present

## 2019-07-31 DIAGNOSIS — M1990 Unspecified osteoarthritis, unspecified site: Secondary | ICD-10-CM | POA: Insufficient documentation

## 2019-07-31 DIAGNOSIS — Z79890 Hormone replacement therapy (postmenopausal): Secondary | ICD-10-CM | POA: Diagnosis not present

## 2019-07-31 DIAGNOSIS — H409 Unspecified glaucoma: Secondary | ICD-10-CM | POA: Diagnosis not present

## 2019-07-31 DIAGNOSIS — I10 Essential (primary) hypertension: Secondary | ICD-10-CM | POA: Insufficient documentation

## 2019-07-31 DIAGNOSIS — I493 Ventricular premature depolarization: Secondary | ICD-10-CM | POA: Insufficient documentation

## 2019-07-31 DIAGNOSIS — E7800 Pure hypercholesterolemia, unspecified: Secondary | ICD-10-CM | POA: Diagnosis not present

## 2019-07-31 DIAGNOSIS — J449 Chronic obstructive pulmonary disease, unspecified: Secondary | ICD-10-CM | POA: Insufficient documentation

## 2019-07-31 DIAGNOSIS — Z96653 Presence of artificial knee joint, bilateral: Secondary | ICD-10-CM | POA: Diagnosis not present

## 2019-07-31 DIAGNOSIS — Z7901 Long term (current) use of anticoagulants: Secondary | ICD-10-CM | POA: Insufficient documentation

## 2019-07-31 NOTE — Progress Notes (Signed)
Date:  07/31/2019   ID:  Lydia Myers, DOB 1941/09/26, MRN [id number]  Location: home  Provider location: [address] [place], [place] [address] Evaluation Performed:  Follow up   PCP:  Kelton Pillar, MD  Primary Cardiologist:  none  Primary Electrophysiologist:Dr. Allred  CC: permament  afib   History of Present Illness: Lydia Myers is a 78 y.o. female who presents for f/u of permanent afib today.  . She reports today that her HR runs mostly in the 70's. BP elevated this am, had a hard time getting here with the heavy rain. Rechecked 150/80.Marland KitchenShe is not aware of any spells of heart racing. She feels well. She does not have any pedal edema, takes lasix 3x a week.  No  bleeding issues with xarelto.  Today, she denies symptoms of palpitations, chest pain, shortness of breath, orthopnea, PND, lower extremity edema, claudication, dizziness, presyncope, syncope, bleeding, or neurologic sequela. The patient is tolerating medications without difficulties and is otherwise without complaint today.   she denies symptoms of cough, fevers, chills, or new SOB worrisome for COVID 19.     Past Medical History:  Diagnosis Date  . Arthritis   . Asthma   . COPD (chronic obstructive pulmonary disease) ([place])   . Diabetes mellitus   . Glaucoma   . High cholesterol   . Hypertension    Past Surgical History:  Procedure Laterality Date  . ABDOMINAL HYSTERECTOMY    . HIP SURGERY    . JOINT REPLACEMENT    . left lumpectomy    . REPLACEMENT TOTAL KNEE BILATERAL     2009-Right, 2001-Left  . Right bog toe fusion    . Right trigger finger surgery       Current Outpatient Medications  Medication Sig Dispense Refill  . atorvastatin (LIPITOR) 10 MG tablet TAKE 1 TABLET BY MOUTH EVERY DAY AT 6PM 90 tablet 0  . AZOPT 1 % ophthalmic suspension Place 1 drop into both eyes 2 (two) times daily.  0  . clotrimazole-betamethasone (LOTRISONE) cream Apply 1 application topically daily as  needed for irritation. Apply 1 application every day as needed for vaginal irritation  0  . diltiazem (CARDIZEM CD) 240 MG 24 hr capsule TAKE 1 CAPSULE BY MOUTH EVERY DAY 90 capsule 2  . furosemide (LASIX) 20 MG tablet Take 1 tablet by mouth every 3 days. 15 tablet 6  . JANUVIA 100 MG tablet Take 100 mg by mouth daily.  0  . KLOR-CON M10 10 MEQ tablet TAKE 1 TABLET BY MOUTH EVERY DAY 90 tablet 2  . levothyroxine (SYNTHROID, LEVOTHROID) 25 MCG tablet Take 1 tablet (25 mcg total) by mouth daily before breakfast. 30 tablet 0  . loratadine (CLARITIN) 10 MG tablet Take 10 mg by mouth daily.    . metFORMIN (GLUCOPHAGE) 850 MG tablet Take 850-1,700 mg by mouth 2 (two) times daily. Take 850mg  by mouth in the morning, and take 1700mg  by mouth in the evening  1  . olmesartan-hydrochlorothiazide (BENICAR HCT) 40-25 MG tablet Take 1 tablet by mouth daily.  12  . Rivaroxaban (XARELTO) 15 MG TABS tablet TAKE 1 TABLET BY MOUTH EVERY DAY WITH DINNER 30 tablet 6  . TRAVATAN Z 0.004 % SOLN ophthalmic solution Place 1 drop into both eyes every evening.  0  . white petrolatum (VASELINE) GEL Apply 1 application topically as needed for lip care. 16.8 g 0   No current facility-administered medications for this encounter.  Allergies:   Shrimp [shellfish allergy]   Social History:  The patient  reports that she has never smoked. She has never used smokeless tobacco. She reports that she does not drink alcohol or use drugs.   Family History:  The patient's  family history includes Diabetes in her brother.    ROS: GEN- The patient is well appearing, alert and oriented x 3 today.   Head- normocephalic, atraumatic Eyes-  Sclera clear, conjunctiva pink Ears- hearing intact Oropharynx- clear Lungs- Clear to ausculation bilaterally, normal work of breathing Heart- Regular rate and rhythm, no murmurs, rubs or gallops, PMI not laterally displaced GI- soft, NT, ND, + BS Extremities- no clubbing, cyanosis, or edema   EKG-Sinus rhythm at 92 bpm, PR int 168 ms, qrs int 76 ms, qtc 469 ms  Exam: N/A  Recent Labs: No results found for requested labs within last 8760 hours.  personally reviewed      ASSESSMENT AND PLAN:  1.Permanent atrial fibrillation Pt sounds to be well rate controlled  She feels well at her current baseline No  change in treatment  This patients CHA2DS2-VASc Score and unadjusted Ischemic Stroke Rate (% per year) is equal to 7.2 % stroke rate/year from a score of 5  Above score calculated as 1 point each if present [CHF, HTN, DM, Vascular=MI/PAD/Aortic Plaque, Age if 65-74, or Female] Above score calculated as 2 points each if present [Age > 75, or Stroke/TIA/TE]  2.HTN Elevated today, better on recheck BP is stable at home  COVID screen The patient does not have any symptoms that suggest any further testing/ screening at this time.  Social distancing reinforced today.   Follow-up:  6 months  Current medicines are reviewed at length with the patient today.   The patient does not have concerns regarding her medicines.  The following changes were made today:  none  Labs/ tests ordered today include: none Orders Placed This Encounter  Procedures  . EKG 12-Lead    Patient Risk:  after full review of this patients clinical status, I feel that they are at moderate risk at this time.   Signed, Roderic Palau NP  07/31/2019 8:59 AM  [facility] [address] [place],  [address] [phone]

## 2019-08-24 ENCOUNTER — Other Ambulatory Visit (HOSPITAL_COMMUNITY): Payer: Self-pay | Admitting: *Deleted

## 2019-10-05 ENCOUNTER — Other Ambulatory Visit (HOSPITAL_COMMUNITY): Payer: Self-pay | Admitting: *Deleted

## 2019-10-05 MED ORDER — RIVAROXABAN 15 MG PO TABS: ORAL_TABLET | ORAL | 6 refills | Status: DC

## 2019-12-07 ENCOUNTER — Telehealth (HOSPITAL_COMMUNITY): Payer: Self-pay | Admitting: Nurse Practitioner

## 2019-12-07 ENCOUNTER — Other Ambulatory Visit: Payer: Self-pay

## 2019-12-07 ENCOUNTER — Ambulatory Visit (HOSPITAL_COMMUNITY): Admission: RE | Admit: 2019-12-07 | Payer: Medicare Other | Source: Ambulatory Visit | Admitting: Nurse Practitioner

## 2019-12-07 NOTE — Telephone Encounter (Signed)
Had a virtual visit set up for patient who has permament afib but she states that she has felt  bad for 2 weeks and her BP today was 98/60 with a pulse of 170 bpm, but she thinks her BP cuff may be broke. Also has  been aware of  feeling nervous more short of breath and feeling her heart beat out of her chest for this period of time and is sitting up to sleep. She is taking her lasix as ordered.  I have asked her to come into the office tomorrow for a in person  visit as her condition sounds too difficult to dx over the phone. If she worsens, to the ER otherwise, I will see tomorrow.

## 2019-12-08 ENCOUNTER — Other Ambulatory Visit: Payer: Self-pay

## 2019-12-08 ENCOUNTER — Encounter (HOSPITAL_COMMUNITY): Payer: Self-pay | Admitting: Nurse Practitioner

## 2019-12-08 ENCOUNTER — Emergency Department (HOSPITAL_COMMUNITY): Payer: Medicare Other

## 2019-12-08 ENCOUNTER — Ambulatory Visit (HOSPITAL_COMMUNITY)
Admission: RE | Admit: 2019-12-08 | Discharge: 2019-12-08 | Disposition: A | Discharge status: Home / Self Care | Payer: Medicare Other | Source: Ambulatory Visit | Attending: Nurse Practitioner | Admitting: Nurse Practitioner

## 2019-12-08 ENCOUNTER — Inpatient Hospital Stay (HOSPITAL_COMMUNITY)
Admission: EM | Admit: 2019-12-08 | Discharge: 2019-12-15 | DRG: 243 | Disposition: A | Discharge status: Home / Self Care | Payer: Medicare Other | Attending: Cardiology | Admitting: Cardiology

## 2019-12-08 ENCOUNTER — Telehealth (HOSPITAL_COMMUNITY): Payer: Self-pay | Admitting: *Deleted

## 2019-12-08 ENCOUNTER — Encounter (HOSPITAL_COMMUNITY): Payer: Self-pay | Admitting: Emergency Medicine

## 2019-12-08 ENCOUNTER — Other Ambulatory Visit (HOSPITAL_COMMUNITY): Payer: Self-pay | Admitting: Nurse Practitioner

## 2019-12-08 VITALS — BP 134/100 | HR 202 | Ht 64.0 in | Wt 166.0 lb

## 2019-12-08 DIAGNOSIS — Z20822 Contact with and (suspected) exposure to covid-19: Secondary | ICD-10-CM | POA: Diagnosis present

## 2019-12-08 DIAGNOSIS — Z79899 Other long term (current) drug therapy: Secondary | ICD-10-CM | POA: Insufficient documentation

## 2019-12-08 DIAGNOSIS — Z950 Presence of cardiac pacemaker: Secondary | ICD-10-CM | POA: Diagnosis not present

## 2019-12-08 DIAGNOSIS — I429 Cardiomyopathy, unspecified: Secondary | ICD-10-CM | POA: Diagnosis present

## 2019-12-08 DIAGNOSIS — I959 Hypotension, unspecified: Secondary | ICD-10-CM | POA: Diagnosis not present

## 2019-12-08 DIAGNOSIS — I5032 Chronic diastolic (congestive) heart failure: Secondary | ICD-10-CM | POA: Diagnosis present

## 2019-12-08 DIAGNOSIS — Z90710 Acquired absence of both cervix and uterus: Secondary | ICD-10-CM

## 2019-12-08 DIAGNOSIS — Z96653 Presence of artificial knee joint, bilateral: Secondary | ICD-10-CM | POA: Diagnosis present

## 2019-12-08 DIAGNOSIS — I5189 Other ill-defined heart diseases: Secondary | ICD-10-CM

## 2019-12-08 DIAGNOSIS — I130 Hypertensive heart and chronic kidney disease with heart failure and stage 1 through stage 4 chronic kidney disease, or unspecified chronic kidney disease: Secondary | ICD-10-CM | POA: Diagnosis present

## 2019-12-08 DIAGNOSIS — Z79890 Hormone replacement therapy (postmenopausal): Secondary | ICD-10-CM | POA: Diagnosis not present

## 2019-12-08 DIAGNOSIS — Z833 Family history of diabetes mellitus: Secondary | ICD-10-CM | POA: Diagnosis not present

## 2019-12-08 DIAGNOSIS — I4891 Unspecified atrial fibrillation: Secondary | ICD-10-CM | POA: Diagnosis not present

## 2019-12-08 DIAGNOSIS — N179 Acute kidney failure, unspecified: Secondary | ICD-10-CM | POA: Diagnosis present

## 2019-12-08 DIAGNOSIS — E7800 Pure hypercholesterolemia, unspecified: Secondary | ICD-10-CM | POA: Diagnosis present

## 2019-12-08 DIAGNOSIS — E785 Hyperlipidemia, unspecified: Secondary | ICD-10-CM | POA: Diagnosis present

## 2019-12-08 DIAGNOSIS — I4892 Unspecified atrial flutter: Secondary | ICD-10-CM | POA: Diagnosis not present

## 2019-12-08 DIAGNOSIS — I483 Typical atrial flutter: Secondary | ICD-10-CM | POA: Diagnosis present

## 2019-12-08 DIAGNOSIS — Z7901 Long term (current) use of anticoagulants: Secondary | ICD-10-CM

## 2019-12-08 DIAGNOSIS — I340 Nonrheumatic mitral (valve) insufficiency: Secondary | ICD-10-CM | POA: Diagnosis not present

## 2019-12-08 DIAGNOSIS — I371 Nonrheumatic pulmonary valve insufficiency: Secondary | ICD-10-CM | POA: Diagnosis not present

## 2019-12-08 DIAGNOSIS — I447 Left bundle-branch block, unspecified: Secondary | ICD-10-CM | POA: Diagnosis not present

## 2019-12-08 DIAGNOSIS — I471 Supraventricular tachycardia: Secondary | ICD-10-CM

## 2019-12-08 DIAGNOSIS — J449 Chronic obstructive pulmonary disease, unspecified: Secondary | ICD-10-CM | POA: Diagnosis present

## 2019-12-08 DIAGNOSIS — R9431 Abnormal electrocardiogram [ECG] [EKG]: Secondary | ICD-10-CM | POA: Diagnosis not present

## 2019-12-08 DIAGNOSIS — E1122 Type 2 diabetes mellitus with diabetic chronic kidney disease: Secondary | ICD-10-CM | POA: Diagnosis present

## 2019-12-08 DIAGNOSIS — I519 Heart disease, unspecified: Secondary | ICD-10-CM

## 2019-12-08 DIAGNOSIS — I5031 Acute diastolic (congestive) heart failure: Secondary | ICD-10-CM | POA: Diagnosis not present

## 2019-12-08 DIAGNOSIS — Z91013 Allergy to seafood: Secondary | ICD-10-CM

## 2019-12-08 DIAGNOSIS — I509 Heart failure, unspecified: Secondary | ICD-10-CM

## 2019-12-08 DIAGNOSIS — I4821 Permanent atrial fibrillation: Secondary | ICD-10-CM | POA: Insufficient documentation

## 2019-12-08 DIAGNOSIS — E039 Hypothyroidism, unspecified: Secondary | ICD-10-CM | POA: Diagnosis present

## 2019-12-08 DIAGNOSIS — N1831 Chronic kidney disease, stage 3a: Secondary | ICD-10-CM | POA: Diagnosis present

## 2019-12-08 DIAGNOSIS — Z7984 Long term (current) use of oral hypoglycemic drugs: Secondary | ICD-10-CM | POA: Diagnosis not present

## 2019-12-08 DIAGNOSIS — I361 Nonrheumatic tricuspid (valve) insufficiency: Secondary | ICD-10-CM | POA: Diagnosis not present

## 2019-12-08 DIAGNOSIS — R0602 Shortness of breath: Secondary | ICD-10-CM

## 2019-12-08 HISTORY — DX: Cardiomyopathy, unspecified: I42.9

## 2019-12-08 HISTORY — DX: Heart disease, unspecified: I51.9

## 2019-12-08 HISTORY — DX: Other ill-defined heart diseases: I51.89

## 2019-12-08 LAB — PROTIME-INR
INR: 1.8 — ABNORMAL HIGH (ref 0.8–1.2)
INR: 1.6 — ABNORMAL HIGH (ref 0.8–1.2)
Prothrombin Time: 20.8 seconds — ABNORMAL HIGH (ref 11.4–15.2)
Prothrombin Time: 19.2 seconds — ABNORMAL HIGH (ref 11.4–15.2)

## 2019-12-08 LAB — CBC WITH DIFFERENTIAL/PLATELET
Abs Immature Granulocytes: 0.03 10*3/uL (ref 0.00–0.07)
Basophils Absolute: 0 10*3/uL (ref 0.0–0.1)
Basophils Relative: 1 %
Eosinophils Absolute: 0.1 10*3/uL (ref 0.0–0.5)
Eosinophils Relative: 1 %
HCT: 41 % (ref 36.0–46.0)
Hemoglobin: 12.7 g/dL (ref 12.0–15.0)
Immature Granulocytes: 0 %
Lymphocytes Relative: 23 %
Lymphs Abs: 1.8 10*3/uL (ref 0.7–4.0)
MCH: 28.4 pg (ref 26.0–34.0)
MCHC: 31 g/dL (ref 30.0–36.0)
MCV: 91.7 fL (ref 80.0–100.0)
Monocytes Absolute: 0.9 10*3/uL (ref 0.1–1.0)
Monocytes Relative: 12 %
Neutro Abs: 4.9 10*3/uL (ref 1.7–7.7)
Neutrophils Relative %: 63 %
Platelets: 382 10*3/uL (ref 150–400)
RBC: 4.47 MIL/uL (ref 3.87–5.11)
RDW: 14.3 % (ref 11.5–15.5)
WBC: 7.7 10*3/uL (ref 4.0–10.5)
nRBC: 0 % (ref 0.0–0.2)

## 2019-12-08 LAB — BASIC METABOLIC PANEL
Anion gap: 12 (ref 5–15)
BUN: 29 mg/dL — ABNORMAL HIGH (ref 8–23)
CO2: 21 mmol/L — ABNORMAL LOW (ref 22–32)
Calcium: 9.3 mg/dL (ref 8.9–10.3)
Chloride: 107 mmol/L (ref 98–111)
Creatinine, Ser: 1.53 mg/dL — ABNORMAL HIGH (ref 0.44–1.00)
GFR calc Af Amer: 37 mL/min — ABNORMAL LOW (ref >60)
GFR calc non Af Amer: 32 mL/min — ABNORMAL LOW (ref >60)
Glucose, Bld: 142 mg/dL — ABNORMAL HIGH (ref 70–99)
Potassium: 4.9 mmol/L (ref 3.5–5.1)
Sodium: 140 mmol/L (ref 135–145)

## 2019-12-08 LAB — CBG MONITORING, ED: Glucose-Capillary: 172 mg/dL — ABNORMAL HIGH (ref 70–99)

## 2019-12-08 LAB — TROPONIN I (HIGH SENSITIVITY): Troponin I (High Sensitivity): 16 ng/L (ref <18)

## 2019-12-08 LAB — SARS CORONAVIRUS 2 (TAT 6-24 HRS): SARS Coronavirus 2: NEGATIVE

## 2019-12-08 MED ORDER — WHITE PETROLATUM GEL: 1.0000 "application " | Status: DC | PRN

## 2019-12-08 MED ORDER — INSULIN ASPART 100 UNIT/ML ~~LOC~~ SOLN
0.0000 [IU] | Freq: Three times a day (TID) | SUBCUTANEOUS | Status: DC
  Administered 2019-12-09: 09:00:00 2 [IU] via SUBCUTANEOUS
  Administered 2019-12-09: 18:00:00 3 [IU] via SUBCUTANEOUS
  Administered 2019-12-13: 12:00:00 8 [IU] via SUBCUTANEOUS

## 2019-12-08 MED ORDER — POTASSIUM CHLORIDE CRYS ER 10 MEQ PO TBCR
10.0000 meq | EXTENDED_RELEASE_TABLET | Freq: Every day | ORAL | Status: DC
  Administered 2019-12-09 – 2019-12-15 (×7): 10 meq via ORAL
  Filled 2019-12-08 (×8): qty 1

## 2019-12-08 MED ORDER — BRINZOLAMIDE 1 % OP SUSP
1.0000 [drp] | Freq: Two times a day (BID) | OPHTHALMIC | Status: DC
  Administered 2019-12-09 – 2019-12-15 (×13): 1 [drp] via OPHTHALMIC
  Filled 2019-12-08: qty 10

## 2019-12-08 MED ORDER — WHITE PETROLATUM EX OINT
TOPICAL_OINTMENT | CUTANEOUS | Status: DC | PRN
  Filled 2019-12-08: qty 5
  Filled 2019-12-08: qty 28.35

## 2019-12-08 MED ORDER — METOPROLOL TARTRATE 5 MG/5ML IV SOLN
5.0000 mg | Freq: Once | INTRAVENOUS | Status: AC
  Administered 2019-12-08: 19:00:00 5 mg via INTRAVENOUS
  Filled 2019-12-08: qty 5

## 2019-12-08 MED ORDER — ATORVASTATIN CALCIUM 10 MG PO TABS
10.0000 mg | ORAL_TABLET | Freq: Every day | ORAL | Status: DC
  Administered 2019-12-09 – 2019-12-13 (×5): 10 mg via ORAL
  Filled 2019-12-08 (×5): qty 1

## 2019-12-08 MED ORDER — DILTIAZEM LOAD VIA INFUSION
10.0000 mg | Freq: Once | INTRAVENOUS | Status: AC
  Administered 2019-12-08: 17:00:00 10 mg via INTRAVENOUS
  Filled 2019-12-08: qty 10

## 2019-12-08 MED ORDER — DILTIAZEM HCL-DEXTROSE 125-5 MG/125ML-% IV SOLN (PREMIX)
5.0000 mg/h | INTRAVENOUS | Status: DC
  Administered 2019-12-08: 5 mg/h via INTRAVENOUS
  Administered 2019-12-09 (×2): 15 mg/h via INTRAVENOUS
  Filled 2019-12-08 (×4): qty 125

## 2019-12-08 MED ORDER — RIVAROXABAN 15 MG PO TABS
15.0000 mg | ORAL_TABLET | Freq: Every day | ORAL | Status: DC
  Administered 2019-12-08 – 2019-12-13 (×6): 15 mg via ORAL
  Filled 2019-12-08 (×7): qty 1

## 2019-12-08 MED ORDER — FUROSEMIDE 10 MG/ML IJ SOLN
20.0000 mg | Freq: Once | INTRAMUSCULAR | Status: AC
  Administered 2019-12-08: 23:00:00 20 mg via INTRAVENOUS
  Filled 2019-12-08: qty 2

## 2019-12-08 MED ORDER — INSULIN ASPART 100 UNIT/ML ~~LOC~~ SOLN: 0.0000 [IU] | Freq: Every day | SUBCUTANEOUS | Status: DC

## 2019-12-08 MED ORDER — METOPROLOL TARTRATE 5 MG/5ML IV SOLN
2.5000 mg | INTRAVENOUS | Status: DC | PRN
  Administered 2019-12-11 (×3): 2.5 mg via INTRAVENOUS
  Filled 2019-12-08 (×3): qty 5

## 2019-12-08 MED ORDER — ADENOSINE 6 MG/2ML IV SOLN
6.0000 mg | Freq: Once | INTRAVENOUS | Status: AC
  Administered 2019-12-08: 17:00:00 6 mg via INTRAVENOUS
  Filled 2019-12-08: qty 2

## 2019-12-08 MED ORDER — LEVOTHYROXINE SODIUM 25 MCG PO TABS
25.0000 ug | ORAL_TABLET | Freq: Every day | ORAL | Status: DC
  Administered 2019-12-09 – 2019-12-15 (×6): 25 ug via ORAL
  Filled 2019-12-08 (×6): qty 1

## 2019-12-08 MED ORDER — LINAGLIPTIN 5 MG PO TABS
5.0000 mg | ORAL_TABLET | Freq: Every day | ORAL | Status: DC
  Administered 2019-12-09: 18:00:00 5 mg via ORAL
  Filled 2019-12-08 (×2): qty 1

## 2019-12-08 MED ORDER — LATANOPROST 0.005 % OP SOLN
1.0000 [drp] | Freq: Every day | OPHTHALMIC | Status: DC
  Administered 2019-12-10 – 2019-12-14 (×5): 1 [drp] via OPHTHALMIC
  Filled 2019-12-08 (×2): qty 2.5

## 2019-12-08 MED ORDER — LORATADINE 10 MG PO TABS
10.0000 mg | ORAL_TABLET | Freq: Every day | ORAL | Status: DC
  Administered 2019-12-14 – 2019-12-15 (×2): 10 mg via ORAL
  Filled 2019-12-08 (×5): qty 1

## 2019-12-08 NOTE — Telephone Encounter (Signed)
Patient friend for contact is Charlene at [phone]. Her children live out of town but are listed in demographics if needed for contact.

## 2019-12-08 NOTE — ED Notes (Signed)
Pt. states distress, feels like she is unable to breathe. This tech sat patient up, pt is now at 90 degrees. Pt.'s O2 stat is 93. This tech will follow up with nurse.

## 2019-12-08 NOTE — ED Notes (Signed)
Tell pt Lydia Myers call and she will check on her tomorrow

## 2019-12-08 NOTE — ED Notes (Signed)
The daughter Valentino Saxon would like an update from the dr. Whenever possible

## 2019-12-08 NOTE — ED Notes (Signed)
Attempted to call nursing report.  

## 2019-12-08 NOTE — ED Provider Notes (Signed)
[facility] EMERGENCY DEPARTMENT Provider Note   CSN: [id number] Arrival date & time: 12/08/19  1557     History Chief Complaint  Patient presents with  . Atrial Fibrillation    Lydia Myers is a 79 y.o. female.  Patient is an 79 year old female with past medical history of diabetes, COPD, hypertension, and atrial fibrillation.  She was sent today from the A. fib clinic for evaluation of rapid heart rate.  Patient states she has felt intermittent fluttering off and on for the past several weeks.  This became worse yesterday evening.  She called the clinic and was seen, then sent here for elevated heart rate.  She apparently was found to have a heart rate over 200.  Patient feels fatigued and feels palpitations, but denies any chest pain or shortness of breath.  She denies any fevers or chills.  The history is provided by the patient.  Atrial Fibrillation This is a recurrent problem. The problem occurs constantly. The problem has not changed since onset.Pertinent negatives include no chest pain and no shortness of breath. Nothing aggravates the symptoms. Nothing relieves the symptoms. She has tried nothing for the symptoms.       Past Medical History:  Diagnosis Date  . Arthritis   . Asthma   . COPD (chronic obstructive pulmonary disease) ([place])   . Diabetes mellitus   . Glaucoma   . High cholesterol   . Hypertension     Patient Active Problem List   Diagnosis Date Noted  . Hypothyroidism 07/04/2017  . Dizziness 07/04/2017  . Elevated d-dimer 07/04/2017  . Klebsiella pneumoniae infection 07/04/2017  . Atrial fibrillation with rapid ventricular response ([place]) 07/01/2017  . Atrial fibrillation with RVR ([place]) 07/01/2017  . Pulmonary edema cardiac cause ([place])   . ATRIAL FLUTTER 11/09/2010  . CHEST PAIN 11/09/2010  . CARDIOMEGALY 11/08/2010  . ASTHMA, UNSPECIFIED 11/10/2009  . RESPIRATORY FAILURE, CHRONIC 11/10/2009  . WEIGHT GAIN, ABNORMAL 11/10/2009    . HYPERLIPIDEMIA 03/01/2008  . Essential hypertension 03/01/2008  . ALLERGIC RHINITIS 03/01/2008  . SHORTNESS OF BREATH (SOB) 03/01/2008    Past Surgical History:  Procedure Laterality Date  . ABDOMINAL HYSTERECTOMY    . HIP SURGERY    . JOINT REPLACEMENT    . left lumpectomy    . REPLACEMENT TOTAL KNEE BILATERAL     2009-Right, 2001-Left  . Right bog toe fusion    . Right trigger finger surgery       OB History   No obstetric history on file.     Family History  Problem Relation Age of Onset  . Diabetes Brother     Social History   Tobacco Use  . Smoking status: Never Smoker  . Smokeless tobacco: Never Used  Substance Use Topics  . Alcohol use: No  . Drug use: No    Home Medications Prior to Admission medications   Medication Sig Start Date End Date Taking? Authorizing Provider  atorvastatin (LIPITOR) 10 MG tablet TAKE 1 TABLET BY MOUTH EVERY DAY AT [facility] 06/01/19   Sherran Needs, NP  AZOPT 1 % ophthalmic suspension Place 1 drop into both eyes 2 (two) times daily. 05/01/17   [provider]  clotrimazole-betamethasone (LOTRISONE) cream Apply 1 application topically daily as needed for irritation. Apply 1 application every day as needed for vaginal irritation 05/23/17   [provider]  diltiazem (CARDIZEM CD) 240 MG 24 hr capsule TAKE 1 CAPSULE BY MOUTH EVERY DAY 04/06/19   Kayleen Memos,  Geroge Baseman, NP  furosemide (LASIX) 20 MG tablet Take 1 tablet by mouth every 3 days. 09/23/18   Sherran Needs, NP  JANUVIA 100 MG tablet Take 100 mg by mouth daily. 05/01/17   [provider]  KLOR-CON M10 10 MEQ tablet TAKE 1 TABLET BY MOUTH EVERY DAY 04/06/19   Sherran Needs, NP  levothyroxine (SYNTHROID, LEVOTHROID) 25 MCG tablet Take 1 tablet (25 mcg total) by mouth daily before breakfast. 07/06/17   Raiford Noble Latif, DO  loratadine (CLARITIN) 10 MG tablet Take 10 mg by mouth daily.    [provider]  metFORMIN (GLUCOPHAGE) 850 MG tablet Take  850-1,700 mg by mouth 2 (two) times daily. Take 850mg  by mouth in the morning, and take 1700mg  by mouth in the evening 06/12/17   [provider]  olmesartan (BENICAR) 40 MG tablet Take 40 mg by mouth daily. 11/27/19   [provider]  Rivaroxaban (XARELTO) 15 MG TABS tablet TAKE 1 TABLET BY MOUTH EVERY DAY WITH DINNER 10/05/19   Sherran Needs, NP  TRAVATAN Z 0.004 % SOLN ophthalmic solution Place 1 drop into both eyes every evening. 05/01/17   [provider]  VYZULTA 0.024 % SOLN  11/20/19   [provider]  white petrolatum (VASELINE) GEL Apply 1 application topically as needed for lip care. 07/05/17   Raiford Noble Latif, DO    Allergies    Shrimp [shellfish allergy]  Review of Systems   Review of Systems  Respiratory: Negative for shortness of breath.   Cardiovascular: Negative for chest pain.  All other systems reviewed and are negative.   Physical Exam Updated Vital Signs BP 135/85 (BP Location: Right Arm)   Pulse (!) 121   Temp 98 F (36.7 C) (Oral)   Resp (!) 30   Ht 5\' 4"  (1.626 m)   Wt 75.3 kg   SpO2 95%   BMI 28.49 kg/m   Physical Exam Vitals and nursing note reviewed.  Constitutional:      General: She is not in acute distress.    Appearance: She is well-developed. She is not diaphoretic.  HENT:     Head: Normocephalic and atraumatic.  Cardiovascular:     Rate and Rhythm: Regular rhythm. Tachycardia present.     Heart sounds: No murmur. No friction rub. No gallop.      Comments: Heart is markedly tachycardic, but regular. Pulmonary:     Effort: Pulmonary effort is normal. No respiratory distress.     Breath sounds: Normal breath sounds. No wheezing.  Abdominal:     General: Bowel sounds are normal. There is no distension.     Palpations: Abdomen is soft.     Tenderness: There is no abdominal tenderness.  Musculoskeletal:        General: No swelling or tenderness. Normal range of motion.     Cervical back: Normal range of  motion and neck supple.     Right lower leg: No edema.     Left lower leg: No edema.  Skin:    General: Skin is warm and dry.  Neurological:     Mental Status: She is alert and oriented to person, place, and time.     ED Results / Procedures / Treatments   Labs (all labs ordered are listed, but only abnormal results are displayed) Labs Reviewed  CBC WITH DIFFERENTIAL/PLATELET  BASIC METABOLIC PANEL  PROTIME-INR  TROPONIN I (HIGH SENSITIVITY)    EKG ED ECG REPORT   Date: 12/08/2019  Rate: 202  Rhythm: atrial flutter with rvr  QRS Axis: normal  Intervals: normal  ST/T Wave abnormalities: nonspecific T wave changes  Conduction Disutrbances:none  Narrative Interpretation:   Old EKG Reviewed: changes noted  I have personally reviewed the EKG tracing and agree with the computerized printout as noted.   Radiology No results found.  Procedures Procedures (including critical care time)  Medications Ordered in ED Medications  diltiazem (CARDIZEM) 1 mg/mL load via infusion 10 mg (has no administration in time range)    And  diltiazem (CARDIZEM) 125 mg in dextrose 5% 125 mL (1 mg/mL) infusion (has no administration in time range)  adenosine (ADENOCARD) 6 MG/2ML injection 6 mg (6 mg Intravenous Given 12/08/19 1632)    ED Course  I have reviewed the triage vital signs and the nursing notes.  Pertinent labs & imaging results that were available during my care of the patient were reviewed by me and considered in my medical decision making (see chart for details).    MDM Rules/Calculators/A&P  Patient sent here from the [facility] clinic for evaluation of rapid heart rate.  Patient reports palpitations for the past week, however this became worse and persistent last night.  She called the clinic and was found to be in A. fliutter with RVR with a rate over 200, then was sent here to be evaluated.  Patient's initial EKG shows atrial flutter with regular rate of 202.  Her blood  pressures were in the 130s and she appears to be tolerating this very well.  There were no signs of heart failure and no hypoxia.  She was given a dose of adenosine to help in determining whether this was SVT or atrial flutter.  She was given 6 mg of adenosine and her heart rate slowed enough to show underlying flutter waves.  She then returned to a heart rate of 200.  Patient then started on a Cardizem drip with some improvement in her rate to the 130s.  Laboratory studies showed no significant abnormalities at this time.  At this point, I discussed the patient's care with Dr. Acie Fredrickson from cardiology.  He is recommending additional rate control with Lopressor.  She was given 5 mg IV Lopressor and rate improved to 100.  Patient will be evaluated by cardiology and she will be admitted.  CRITICAL CARE Performed by: Veryl Speak Total critical care time: 45 minutes Critical care time was exclusive of separately billable procedures and treating other patients. Critical care was necessary to treat or prevent imminent or life-threatening deterioration. Critical care was time spent personally by me on the following activities: development of treatment plan with patient and/or surrogate as well as nursing, discussions with consultants, evaluation of patient's response to treatment, examination of patient, obtaining history from patient or surrogate, ordering and performing treatments and interventions, ordering and review of laboratory studies, ordering and review of radiographic studies, pulse oximetry and re-evaluation of patient's condition.  Final Clinical Impression(s) / ED Diagnoses Final diagnoses:  None    Rx / DC Orders ED Discharge Orders    None       Veryl Speak, MD 12/08/19 2029

## 2019-12-08 NOTE — ED Notes (Signed)
Pt placed on zoll monitor

## 2019-12-08 NOTE — H&P (Signed)
Cardiology Admission History and Physical:   Patient ID: Lydia Myers MRN: [id number]; DOB: Jan 28, 1941   Admission date: 12/08/2019  Primary Care Provider: Kelton Pillar, MD Primary Cardiologist: None Primary Electrophysiologist:  [facility]  Chief Complaint:  SOB, fatigue  Patient Profile:   Lydia Myers is a 79 y.o. female  history of atrial arrhythmias (afib, typical atrial flutter), HTN, chronic diastolic HF, admitted with tachycardia and fatigue.   History of Present Illness:   Lydia Myers 79 yo female history of atrial arrhythmias (afib, typical atrial flutter), HTN, chronic diastolic HF,   Amiodarone stopped Nov 11 2017, had recurrent afib at that time. History of ablation in 2011 for aflutter with Dr Caryl Comes. From 07/2017 note Dr Rayann Heman mentioned patient could be considered for repeat ablation, appears patient turned down.   Presented with fatigue and SOB. Initially HRs 200s, stable bp's. Bolused 10mg  of diltiazem, started on drip now at 15mg /hr. Given additional 5mg  IV lopressor. BPs are tolerating av nodal agents.    ER vitals: p 202 bp 134/100 99% RA WBC 7.7 Hgb 12.7 Plt 382 INR 1.8 BMET pending COVID pending EKG aflutter 200, diffuse ST depressions likely rate related.   06/2017 echo: LVEF 55-60%, no WMAs, grade II diastolic dysfunction, mod MR, PASP 81    Heart Pathway Score:     Past Medical History:  Diagnosis Date  . Arthritis   . Asthma   . COPD (chronic obstructive pulmonary disease) ([place])   . Diabetes mellitus   . Glaucoma   . High cholesterol   . Hypertension     Past Surgical History:  Procedure Laterality Date  . ABDOMINAL HYSTERECTOMY    . HIP SURGERY    . JOINT REPLACEMENT    . left lumpectomy    . REPLACEMENT TOTAL KNEE BILATERAL     2009-Right, 2001-Left  . Right bog toe fusion    . Right trigger finger surgery       Medications Prior to Admission: Prior to Admission medications   Medication Sig Start Date End Date  Taking? Authorizing Provider  atorvastatin (LIPITOR) 10 MG tablet TAKE 1 TABLET BY MOUTH EVERY DAY AT [facility] 06/01/19   Sherran Needs, NP  AZOPT 1 % ophthalmic suspension Place 1 drop into both eyes 2 (two) times daily. 05/01/17   [provider]  clotrimazole-betamethasone (LOTRISONE) cream Apply 1 application topically daily as needed for irritation. Apply 1 application every day as needed for vaginal irritation 05/23/17   [provider]  diltiazem (CARDIZEM CD) 240 MG 24 hr capsule TAKE 1 CAPSULE BY MOUTH EVERY DAY 04/06/19   Sherran Needs, NP  furosemide (LASIX) 20 MG tablet Take 1 tablet by mouth every 3 days. 09/23/18   Sherran Needs, NP  JANUVIA 100 MG tablet Take 100 mg by mouth daily. 05/01/17   [provider]  KLOR-CON M10 10 MEQ tablet TAKE 1 TABLET BY MOUTH EVERY DAY 04/06/19   Sherran Needs, NP  levothyroxine (SYNTHROID, LEVOTHROID) 25 MCG tablet Take 1 tablet (25 mcg total) by mouth daily before breakfast. 07/06/17   Raiford Noble Latif, DO  loratadine (CLARITIN) 10 MG tablet Take 10 mg by mouth daily.    [provider]  metFORMIN (GLUCOPHAGE) 850 MG tablet Take 850-1,700 mg by mouth 2 (two) times daily. Take 850mg  by mouth in the morning, and take 1700mg  by mouth in the evening 06/12/17   [provider]  olmesartan (BENICAR) 40 MG tablet Take 40 mg by  mouth daily. 11/27/19   [provider]  Rivaroxaban (XARELTO) 15 MG TABS tablet TAKE 1 TABLET BY MOUTH EVERY DAY WITH DINNER 10/05/19   Sherran Needs, NP  TRAVATAN Z 0.004 % SOLN ophthalmic solution Place 1 drop into both eyes every evening. 05/01/17   [provider]  VYZULTA 0.024 % SOLN  11/20/19   [provider]  white petrolatum (VASELINE) GEL Apply 1 application topically as needed for lip care. 07/05/17   Kerney Elbe, DO     Allergies:    Allergies  Allergen Reactions  . Shrimp [Shellfish Allergy] Nausea And Vomiting    Social History:     Social History   Socioeconomic History  . Marital status: Single    Spouse name: Not on file  . Number of children: Not on file  . Years of education: Not on file  . Highest education level: Not on file  Occupational History  . Not on file  Tobacco Use  . Smoking status: Never Smoker  . Smokeless tobacco: Never Used  Substance and Sexual Activity  . Alcohol use: No  . Drug use: No  . Sexual activity: Never  Other Topics Concern  . Not on file  Social History Narrative  . Not on file   Social Determinants of Health   Financial Resource Strain:   . Difficulty of Paying Living Expenses: Not on file  Food Insecurity:   . Worried About Charity fundraiser in the Last Year: Not on file  . Ran Out of Food in the Last Year: Not on file  Transportation Needs:   . Lack of Transportation (Medical): Not on file  . Lack of Transportation (Non-Medical): Not on file  Physical Activity:   . Days of Exercise per Week: Not on file  . Minutes of Exercise per Session: Not on file  Stress:   . Feeling of Stress : Not on file  Social Connections:   . Frequency of Communication with Friends and Family: Not on file  . Frequency of Social Gatherings with Friends and Family: Not on file  . Attends Religious Services: Not on file  . Active Member of Clubs or Organizations: Not on file  . Attends Archivist Meetings: Not on file  . Marital Status: Not on file  Intimate Partner Violence:   . Fear of Current or Ex-Partner: Not on file  . Emotionally Abused: Not on file  . Physically Abused: Not on file  . Sexually Abused: Not on file    Family History:   The patient's family history includes Diabetes in her brother.    ROS:  Please see the history of present illness.  All other ROS reviewed and negative.     Physical Exam/Data:   Vitals:   12/08/19 1737 12/08/19 1745 12/08/19 1800 12/08/19 1830  BP:  (!) 143/73 139/70 (!) 131/99  Pulse:      Resp:      Temp:       TempSrc:      SpO2: 92% 90% 90% 92%  Weight:      Height:       No intake or output data in the 24 hours ending 12/08/19 1837 Last 3 Weights 12/08/2019 12/08/2019 07/31/2019  Weight (lbs) 166 lb 166 lb 166 lb 9.6 oz  Weight (kg) 75.297 kg 75.297 kg 75.569 kg     Body mass index is 28.49 kg/m.  General:  Well nourished, well developed, in no acute distress HEENT: normal  Lymph: no adenopathy Neck: no JVD Endocrine:  No thryomegaly Cardiac:  Regular, tachy, no m/r/gn no jvd Lungs:  clear to auscultation bilaterally, no wheezing, rhonchi or rales  Abd: soft, nontender, no hepatomegaly  Ext: 1+ bilateral LE edema Musculoskeletal:  No deformities, BUE and BLE strength normal and equal Skin: warm and dry  Neuro:  CNs 2-12 intact, no focal abnormalities noted Psych:  Normal affect     Laboratory Data:  High Sensitivity Troponin:  No results for input(s): TROPONINIHS in the last 720 hours.    ChemistryNo results for input(s): NA, K, CL, CO2, GLUCOSE, BUN, CREATININE, CALCIUM, GFRNONAA, GFRAA, ANIONGAP in the last 168 hours.  No results for input(s): PROT, ALBUMIN, AST, ALT, ALKPHOS, BILITOT in the last 168 hours. Hematology Recent Labs  Lab 12/08/19 1631  WBC 7.7  RBC 4.47  HGB 12.7  HCT 41.0  MCV 91.7  MCH 28.4  MCHC 31.0  RDW 14.3  PLT 382   BNPNo results for input(s): BNP, PROBNP in the last 168 hours.  DDimer No results for input(s): DDIMER in the last 168 hours.   Radiology/Studies:  No results found. {   Assessment and Plan:   1. Atrial arrhythmias, currently aflutter with RVR - history of both typical aflutter and afib. Followed by Dr Rayann Heman and afib clinic - prior ablation in 2011 for aflutter. Later with recurrence - failed amio in Jan 2018 for her afib, most recently has been rate controlled. Dr Rayann Heman had discussed repeat aflutter ablation in the past but patient turned down. Has been under rate control strategy on dilt 240 at home.  - on xarelto for  stroke prevention  - on dilt gtt 15mg /hr, given IV lopressor 5mg  x 1 in ER. Bp's stable. HRs high 90s to low 100s, patient feeling better. Can use additional prn IV lopressor if needed.  - rate control overnight, will have EP evaluate patient tomorrow AM to help decide on long term strategy. From 07/31/18 clinic visit afib was well rate controlled on 240mg  daily, it appears her afib is rate controllable while her aflutter is not. She previously had turned down repeat aflutter ablation but is open to it if recommended on our conversation tonight.   07/31/18 EKG rate controlled afib SR by 07/31/2019 EKG  2. Chronic diastolic HF - mild fluid overload exacerbated by severe tachycardia - lasix IV 20 mg x 1, low dose given some AKI - repeat echo once rates controlled to make sure no new tachy mediated dysfunction  3. AKI on CKD - baseline Cr 1.17 basedo n 02/2018 labs - perhaps related to venous congestion due to CHF - follow with gentle diuresis. Hold ARB.    Severity of Illness: The appropriate patient status for this patient is INPATIENT. Inpatient status is judged to be reasonable and necessary in order to provide the required intensity of service to ensure the patient's safety. The patient's presenting symptoms, physical exam findings, and initial radiographic and laboratory data in the context of their chronic comorbidities is felt to place them at high risk for further clinical deterioration. Furthermore, it is not anticipated that the patient will be medically stable for discharge from the hospital within 2 midnights of admission. The following factors support the patient status of inpatient.   " The patient's presenting symptoms include SOB, palpitations, fatigue. " The worrisome physical exam findings include severe tachycardia, volume overload. " The initial radiographic and laboratory data are worrisome because of atrial flutter with rapid ventricular response, acute on  chronic diastolic  heart fialure. " The chronic co-morbidities include aflutter, afib, chronic diastolic HF.    * I certify that at the point of admission it is my clinical judgment that the patient will require inpatient hospital care spanning beyond 2 midnights from the point of admission due to high intensity of service, high risk for further deterioration and high frequency of surveillance required.*    For questions or updates, please contact [place] Please consult [email or web address] for contact info under        Signed, Carlyle Dolly, MD  12/08/2019 6:37 PM

## 2019-12-08 NOTE — Progress Notes (Signed)
Pt in for EKG today with h/o permanent afib rate controlled as she had felt  poorly for 2 weeks with having to sit up at night to breathe, pounding of heart, presyncopal. She had been trying not to come to the office, but I told her yesterday it was very important for her to come in for an EKG. .Noted swollen ankles just this am.  She felt her BP cuff was wrong as the HR read 170 bpm yesterday with  a BP of 98/60 or otherwise would read error.SHe is taking all of her usual meds  with stating no missed doses of  xarelto 15 mg  EKG shows probable atrial flutter at 202 bpm.  To ER.

## 2019-12-08 NOTE — ED Triage Notes (Signed)
Pt has been feeling SOB for two weeks. Pt was at the Afib clinic today and was sent here for rapid rates of 200. Pt states she feels dizzy when she is moving around. Denies CP.

## 2019-12-09 ENCOUNTER — Inpatient Hospital Stay (HOSPITAL_COMMUNITY): Payer: Medicare Other

## 2019-12-09 ENCOUNTER — Encounter (HOSPITAL_COMMUNITY)
Admission: EM | Disposition: A | Discharge status: Home / Self Care | Payer: Self-pay | Source: Home / Self Care | Attending: Cardiology

## 2019-12-09 DIAGNOSIS — I483 Typical atrial flutter: Secondary | ICD-10-CM

## 2019-12-09 HISTORY — PX: A-FLUTTER ABLATION: EP1230

## 2019-12-09 LAB — COMPREHENSIVE METABOLIC PANEL
ALT: 92 U/L — ABNORMAL HIGH (ref 0–44)
AST: 46 U/L — ABNORMAL HIGH (ref 15–41)
Albumin: 4.1 g/dL (ref 3.5–5.0)
Alkaline Phosphatase: 74 U/L (ref 38–126)
Anion gap: 14 (ref 5–15)
BUN: 24 mg/dL — ABNORMAL HIGH (ref 8–23)
CO2: 23 mmol/L (ref 22–32)
Calcium: 9.7 mg/dL (ref 8.9–10.3)
Chloride: 102 mmol/L (ref 98–111)
Creatinine, Ser: 1.29 mg/dL — ABNORMAL HIGH (ref 0.44–1.00)
GFR calc Af Amer: 45 mL/min — ABNORMAL LOW (ref >60)
GFR calc non Af Amer: 39 mL/min — ABNORMAL LOW (ref >60)
Glucose, Bld: 163 mg/dL — ABNORMAL HIGH (ref 70–99)
Potassium: 4.8 mmol/L (ref 3.5–5.1)
Sodium: 139 mmol/L (ref 135–145)
Total Bilirubin: 1.2 mg/dL (ref 0.3–1.2)
Total Protein: 6.6 g/dL (ref 6.5–8.1)

## 2019-12-09 LAB — GLUCOSE, CAPILLARY
Glucose-Capillary: 138 mg/dL — ABNORMAL HIGH (ref 70–99)
Glucose-Capillary: 98 mg/dL (ref 70–99)
Glucose-Capillary: 137 mg/dL — ABNORMAL HIGH (ref 70–99)
Glucose-Capillary: 166 mg/dL — ABNORMAL HIGH (ref 70–99)
Glucose-Capillary: 184 mg/dL — ABNORMAL HIGH (ref 70–99)

## 2019-12-09 LAB — HEMOGLOBIN A1C
Hgb A1c MFr Bld: 6.7 % — ABNORMAL HIGH (ref 4.8–5.6)
Mean Plasma Glucose: 145.59 mg/dL

## 2019-12-09 LAB — TSH: TSH: 2.335 u[IU]/mL (ref 0.350–4.500)

## 2019-12-09 LAB — MAGNESIUM: Magnesium: 1.8 mg/dL (ref 1.7–2.4)

## 2019-12-09 SURGERY — A-FLUTTER ABLATION

## 2019-12-09 MED ORDER — BUPIVACAINE HCL (PF) 0.25 % IJ SOLN
INTRAMUSCULAR | Status: AC
  Filled 2019-12-09: qty 30

## 2019-12-09 MED ORDER — MIDAZOLAM HCL 5 MG/5ML IJ SOLN
INTRAMUSCULAR | Status: AC
  Filled 2019-12-09: qty 5

## 2019-12-09 MED ORDER — NITROGLYCERIN 0.4 MG SL SUBL: 0.4000 mg | SUBLINGUAL_TABLET | SUBLINGUAL | Status: DC | PRN

## 2019-12-09 MED ORDER — HEPARIN (PORCINE) IN NACL 1000-0.9 UT/500ML-% IV SOLN
INTRAVENOUS | Status: AC
  Filled 2019-12-09: qty 500

## 2019-12-09 MED ORDER — ACETAMINOPHEN 325 MG PO TABS: 650.0000 mg | ORAL_TABLET | ORAL | Status: DC | PRN

## 2019-12-09 MED ORDER — FENTANYL CITRATE (PF) 100 MCG/2ML IJ SOLN
INTRAMUSCULAR | Status: AC
  Filled 2019-12-09: qty 2

## 2019-12-09 MED ORDER — ALPRAZOLAM 0.25 MG PO TABS
0.2500 mg | ORAL_TABLET | Freq: Two times a day (BID) | ORAL | Status: DC | PRN
  Administered 2019-12-09 – 2019-12-10 (×2): 0.25 mg via ORAL
  Filled 2019-12-09 (×3): qty 1

## 2019-12-09 MED ORDER — SODIUM CHLORIDE 0.9 % IV SOLN
250.0000 mL | INTRAVENOUS | Status: DC | PRN
  Administered 2019-12-11: 11:00:00 250 mL via INTRAVENOUS

## 2019-12-09 MED ORDER — NITROGLYCERIN 0.4 MG SL SUBL
SUBLINGUAL_TABLET | SUBLINGUAL | Status: DC | PRN
  Administered 2019-12-09: .4 mg via SUBLINGUAL

## 2019-12-09 MED ORDER — MAGNESIUM SULFATE 2 GM/50ML IV SOLN
2.0000 g | Freq: Once | INTRAVENOUS | Status: AC
  Administered 2019-12-09: 13:00:00 2 g via INTRAVENOUS
  Filled 2019-12-09: qty 50

## 2019-12-09 MED ORDER — MIDAZOLAM HCL 5 MG/5ML IJ SOLN
INTRAMUSCULAR | Status: DC | PRN
  Administered 2019-12-09 (×2): 1 mg via INTRAVENOUS
  Administered 2019-12-09 (×2): 2 mg via INTRAVENOUS
  Administered 2019-12-09 (×2): 1 mg via INTRAVENOUS

## 2019-12-09 MED ORDER — ONDANSETRON HCL 4 MG/2ML IJ SOLN: 4.0000 mg | Freq: Four times a day (QID) | INTRAMUSCULAR | Status: DC | PRN

## 2019-12-09 MED ORDER — SODIUM CHLORIDE 0.9% FLUSH
3.0000 mL | Freq: Two times a day (BID) | INTRAVENOUS | Status: DC
  Administered 2019-12-09 – 2019-12-15 (×5): 3 mL via INTRAVENOUS

## 2019-12-09 MED ORDER — ZOLPIDEM TARTRATE 5 MG PO TABS
5.0000 mg | ORAL_TABLET | Freq: Every evening | ORAL | Status: DC | PRN
  Administered 2019-12-13: 21:00:00 5 mg via ORAL
  Filled 2019-12-09: qty 1

## 2019-12-09 MED ORDER — HEPARIN (PORCINE) IN NACL 1000-0.9 UT/500ML-% IV SOLN
INTRAVENOUS | Status: DC | PRN
  Administered 2019-12-09: 500 mL

## 2019-12-09 MED ORDER — SODIUM CHLORIDE 0.9% FLUSH: 3.0000 mL | INTRAVENOUS | Status: DC | PRN

## 2019-12-09 MED ORDER — FUROSEMIDE 10 MG/ML IJ SOLN
INTRAMUSCULAR | Status: AC
  Filled 2019-12-09: qty 4

## 2019-12-09 MED ORDER — ONDANSETRON HCL 4 MG/2ML IJ SOLN
4.0000 mg | Freq: Four times a day (QID) | INTRAMUSCULAR | Status: DC | PRN
  Administered 2019-12-11 – 2019-12-13 (×2): 4 mg via INTRAVENOUS
  Filled 2019-12-09 (×2): qty 2

## 2019-12-09 MED ORDER — BUPIVACAINE HCL (PF) 0.25 % IJ SOLN
INTRAMUSCULAR | Status: DC | PRN
  Administered 2019-12-09: 30 mL

## 2019-12-09 MED ORDER — NITROGLYCERIN 0.4 MG SL SUBL
SUBLINGUAL_TABLET | SUBLINGUAL | Status: AC
  Filled 2019-12-09: qty 1

## 2019-12-09 MED ORDER — FUROSEMIDE 10 MG/ML IJ SOLN
INTRAMUSCULAR | Status: DC | PRN
  Administered 2019-12-09: 40 mg via INTRAVENOUS

## 2019-12-09 MED ORDER — FENTANYL CITRATE (PF) 100 MCG/2ML IJ SOLN
INTRAMUSCULAR | Status: DC | PRN
  Administered 2019-12-09 (×2): 25 ug via INTRAVENOUS
  Administered 2019-12-09 (×5): 12.5 ug via INTRAVENOUS

## 2019-12-09 SURGICAL SUPPLY — 11 items
BAG SNAP BAND KOVER 36X36 (MISCELLANEOUS) ×2 IMPLANT
CATH BLAZERPRIME XP (ABLATOR) ×2 IMPLANT
CATH JOSEPH QUAD ALLRED 6F REP (CATHETERS) ×2 IMPLANT
CATH POLARIS X 2.5/5/2.5 DECAP (CATHETERS) ×2 IMPLANT
PACK EP LATEX FREE (CUSTOM PROCEDURE TRAY) ×2
PACK EP LF (CUSTOM PROCEDURE TRAY) ×1 IMPLANT
PAD PRO RADIOLUCENT 2001M-C (PAD) ×3 IMPLANT
SHEATH PINNACLE 6F 10CM (SHEATH) ×2 IMPLANT
SHEATH PINNACLE 7F 10CM (SHEATH) ×2 IMPLANT
SHEATH PINNACLE 8F 10CM (SHEATH) ×2 IMPLANT
SHIELD RADPAD SCOOP 12X17 (MISCELLANEOUS) ×2 IMPLANT

## 2019-12-09 NOTE — Progress Notes (Addendum)
Site area: rt groin fv sheaths x3 Site Prior to Removal:  Level 0 Pressure Applied For: 15 minutes Manual:   yes Patient Status During Pull:  stable Post Pull Site:  Level 0 Post Pull Instructions Given:  Yes, but patient is confused. Post Pull Pulses Present: palpable dp Dressing Applied:  Gauze and tegaderm Bedrest begins @ 1540 Comments:  IV saline locked. Rt leg sheeted.

## 2019-12-09 NOTE — H&P (View-Only) (Signed)
Cardiology Consultation:   Patient ID: Lydia Myers MRN: [id number]; DOB: [birth year]  Admit date: 12/08/2019 Date of Consult: 12/09/2019  Primary Care Provider: Kelton Pillar, MD Primary Electrophysiologist:  Dr. Rayann Heman   Patient Profile:   Lydia Myers is a 79 y.o. female with a hx of COPD, DM, HTN, HLD, vertigo, hypothyroidism, AFlutter (ablated 2011, Dr. Caryl Comes) and subsequent Afib who is being seen today for the evaluation of recurrent AFlutter at the request of Dr. Harl Bowie.  AFib,flutter hx AFlutter ablated 2011, Dr. Caryl Comes Recurrent AFlutter  (likely typical) w/1:1 conduction Aug 2018 treated with amio gtt, resumed a/c, pt declined repeat ablation procedure (Dr. Rayann Heman) Jan 2019 noted with Afib Sep 2019 >> rate controlled and asymptomatic with no plans for rhythm control  AAD hx Amiodarone started Aug 2018, stopped Jan 2019 with development of Afib  History of Present Illness:   Lydia Myers is followed by the AFib clinic and Dr. Rayann Heman outpatient, primarily the Afib clinic of late.  Lydia Myers had been offered re-do AFlutter ablation back in 2018 though declined and managed with amiodarone, jan 2019 Lydia Myers developed AFib and this was stopped. Lydia Myers had some chest pressure complaints at this time and declined ischemic evaluation. Sept 2019 again in rate controlled asymptomatic AFib with no plans for rhythm control.  Doing well until recently with a couple weeks of feeling poorly, progressivly SOB and aware of fast HR, Lydia Myers was seen as a work in at the AFib clinic for EKG, noted in [place] w/RVR, 202bpm and sent to he ER. Treated with IV dilt/gtt as well as IV lopressor that slowed her rates with flutter underlying rhythm and has been maintained on dilt gtt Given IV lasix EP is asked to revisit for possible flutter ablation (if ot agreeable) given AFib rates have been controlled historically  LABS K+ 4.8 Mag 1.8 BUN/Creat 24/1.29 WBC 7.7 H/H 12/41 Plts 382 TSH 2.335  HS Trop  16  COVID negative  Home rate controlling meds dilt 240mg  daily  Current  dilt gtt @ 15   Lydia Myers tells me about a week ago Lydia Myers noted her HR fast, and has had progressive weakness, SOB since then.  No CP.  No changes in medicines or recent illnesses.  Lydia Myers says everything had been fine for a couple years and all of a sudden started up again.  Lydia Myers can not think of any particular trigger.  Lydia Myers tells me Lydia Myers is very good about taking her medicines and is certain Lydia Myers has not missed any doses of any.  In particular asked about her xarelto and Lydia Myers is certain, has not missed a does in the last 3 weeks and longer   Heart Pathway Score:     Past Medical History:  Diagnosis Date  . Arthritis   . Asthma   . COPD (chronic obstructive pulmonary disease) ([place])   . Diabetes mellitus   . Glaucoma   . High cholesterol   . Hypertension     Past Surgical History:  Procedure Laterality Date  . ABDOMINAL HYSTERECTOMY    . HIP SURGERY    . JOINT REPLACEMENT    . left lumpectomy    . REPLACEMENT TOTAL KNEE BILATERAL     2009-Right, 2001-Left  . Right bog toe fusion    . Right trigger finger surgery       Home Medications:  Prior to Admission medications   Medication Sig Start Date End Date Taking? Authorizing Provider  atorvastatin (LIPITOR) 10 MG tablet TAKE 1 TABLET BY  MOUTH EVERY DAY AT 6PM Patient taking differently: Take 10 mg by mouth daily at 6 PM.  06/01/19  Yes Sherran Needs, NP  AZOPT 1 % ophthalmic suspension Place 1 drop into both eyes 2 (two) times daily. 05/01/17  Yes [provider]  clotrimazole-betamethasone (LOTRISONE) cream Apply 1 application topically daily as needed for irritation. Apply 1 application every day as needed for vaginal irritation 05/23/17  Yes [provider]  diltiazem (CARDIZEM CD) 240 MG 24 hr capsule TAKE 1 CAPSULE BY MOUTH EVERY DAY Patient taking differently: Take 240 mg by mouth daily.  04/06/19  Yes Sherran Needs, NP  furosemide  (LASIX) 20 MG tablet Take 1 tablet by mouth every 3 days. 09/23/18  Yes Sherran Needs, NP  JANUVIA 100 MG tablet Take 100 mg by mouth daily. 05/01/17  Yes [provider]  KLOR-CON M10 10 MEQ tablet TAKE 1 TABLET BY MOUTH EVERY DAY Patient taking differently: Take 10 mEq by mouth daily.  04/06/19  Yes Sherran Needs, NP  levothyroxine (SYNTHROID, LEVOTHROID) 25 MCG tablet Take 1 tablet (25 mcg total) by mouth daily before breakfast. 07/06/17  Yes Sheikh, Omair Latif, DO  metFORMIN (GLUCOPHAGE) 850 MG tablet Take 850 mg by mouth 2 (two) times daily.  06/12/17  Yes [provider]  olmesartan (BENICAR) 40 MG tablet Take 40 mg by mouth daily. 11/27/19  Yes [provider]  ramipril (ALTACE) 5 MG capsule Take 5 mg by mouth daily.   Yes [provider]  Rivaroxaban (XARELTO) 15 MG TABS tablet TAKE 1 TABLET BY MOUTH EVERY DAY WITH DINNER Patient taking differently: Take 15 mg by mouth daily with supper.  10/05/19  Yes Sherran Needs, NP  TRAVATAN Z 0.004 % SOLN ophthalmic solution Place 1 drop into both eyes every evening. 05/01/17  Yes [provider]  white petrolatum (VASELINE) GEL Apply 1 application topically as needed for lip care. 07/05/17  Yes Kerney Elbe, DO    Inpatient Medications: Scheduled Meds: . atorvastatin  10 mg Oral q1800  . brinzolamide  1 drop Both Eyes BID  . insulin aspart  0-15 Units Subcutaneous TID WC  . insulin aspart  0-5 Units Subcutaneous QHS  . latanoprost  1 drop Both Eyes QHS  . levothyroxine  25 mcg Oral QAC breakfast  . linagliptin  5 mg Oral Daily  . loratadine  10 mg Oral Daily  . potassium chloride  10 mEq Oral Daily  . Rivaroxaban  15 mg Oral Q supper   Continuous Infusions: . diltiazem (CARDIZEM) infusion 15 mg/hr (12/09/19 0827)   PRN Meds: acetaminophen, ALPRAZolam, metoprolol tartrate, nitroGLYCERIN, ondansetron (ZOFRAN) IV, white petrolatum, zolpidem  Allergies:    Allergies  Allergen Reactions   . Shrimp [Shellfish Allergy] Nausea And Vomiting    Social History:   Social History   Socioeconomic History  . Marital status: Single    Spouse name: Not on file  . Number of children: Not on file  . Years of education: Not on file  . Highest education level: Not on file  Occupational History  . Not on file  Tobacco Use  . Smoking status: Never Smoker  . Smokeless tobacco: Never Used  Substance and Sexual Activity  . Alcohol use: No  . Drug use: No  . Sexual activity: Never  Other Topics Concern  . Not on file  Social History Narrative  . Not on file   Social Determinants of Health   Financial Resource Strain:   .  Difficulty of Paying Living Expenses: Not on file  Food Insecurity:   . Worried About Charity fundraiser in the Last Year: Not on file  . Ran Out of Food in the Last Year: Not on file  Transportation Needs:   . Lack of Transportation (Medical): Not on file  . Lack of Transportation (Non-Medical): Not on file  Physical Activity:   . Days of Exercise per Week: Not on file  . Minutes of Exercise per Session: Not on file  Stress:   . Feeling of Stress : Not on file  Social Connections:   . Frequency of Communication with Friends and Family: Not on file  . Frequency of Social Gatherings with Friends and Family: Not on file  . Attends Religious Services: Not on file  . Active Member of Clubs or Organizations: Not on file  . Attends Archivist Meetings: Not on file  . Marital Status: Not on file  Intimate Partner Violence:   . Fear of Current or Ex-Partner: Not on file  . Emotionally Abused: Not on file  . Physically Abused: Not on file  . Sexually Abused: Not on file    Family History:   Family History  Problem Relation Age of Onset  . Diabetes Brother      ROS:  Please see the history of present illness.  All other ROS reviewed and negative.     Physical Exam/Data:   Vitals:   12/09/19 0730 12/09/19 0800 12/09/19 0830 12/09/19 0900   BP: 120/76 119/77 (!) 119/93 133/90  Pulse: 100 100 (!) 101 (!) 101  Resp: (!) 24 16 (!) 25 (!) 27  Temp:      TempSrc:      SpO2: 91% (!) 89% 92% (!) 89%  Weight:      Height:        Intake/Output Summary (Last 24 hours) at 12/09/2019 0936 Last data filed at 12/09/2019 0544 Gross per 24 hour  Intake --  Output 1700 ml  Net -1700 ml   Last 3 Weights 12/09/2019 12/08/2019 12/08/2019  Weight (lbs) 160 lb 12.8 oz 166 lb 166 lb  Weight (kg) 72.938 kg 75.297 kg 75.297 kg     Body mass index is 27.6 kg/m.  General:  Well nourished, well developed, looks winded but in no acute distress HEENT: normal Lymph: no adenopathy Neck: no JVD Endocrine:  No thryomegaly Vascular: No carotid bruits  Cardiac:  RRR; tachycardic no murmurs, gallops or rubs Lungs:  CTA b/l, no wheezing, rhonchi or rales  Abd: soft, nontender, obese Ext: trace if any edema Musculoskeletal:  No deformities, age appropriate atrophy Skin: warm and dry  Neuro:  No gross focal abnormalities noted Psych:  Normal affect     EKG:  The EKG was personally reviewed and demonstrates:    #1 SVT likely AFlutter 202bpm #2 Aflutter 2:1, 100bpm  07/31/2019 SR, PVCs (2) 07/31/2018 SR, PACs 11/11/2017 AFib 71 07/01/2017 SVT, RBBB morphology  Telemetry:  Telemetry was personally reviewed and demonstrates:    AFlutter, consistently about 100bpm 2:1     Relevant CV Studies:  07/02/2017: TTE Study Conclusions - Left ventricle: The cavity size was normal. Wall thickness was  increased in a pattern of mild LVH. Systolic function was normal.  The estimated ejection fraction was in the range of 55% to 60%.  Wall motion was normal; there were no regional wall motion  abnormalities. Features are consistent with a pseudonormal left  ventricular filling pattern, with concomitant abnormal relaxation  and increased filling pressure (grade 2 diastolic dysfunction).  - Ventricular septum: The contour showed diastolic  flattening.  - Mitral valve: There was moderate regurgitation.  - Left atrium: The atrium was mildly dilated.  - Right ventricle: The cavity size was mildly dilated. Wall  thickness was normal.  - Tricuspid valve: There was trivial regurgitation.  - Pulmonary arteries: Systolic pressure was severely increased. PA  peak pressure: 81 mm Hg (S).   Laboratory Data:  High Sensitivity Troponin:   Recent Labs  Lab 12/08/19 1827  TROPONINIHS 16     Chemistry Recent Labs  Lab 12/08/19 1827 12/09/19 0420  NA 140 139  K 4.9 4.8  CL 107 102  CO2 21* 23  GLUCOSE 142* 163*  BUN 29* 24*  CREATININE 1.53* 1.29*  CALCIUM 9.3 9.7  GFRNONAA 32* 39*  GFRAA 37* 45*  ANIONGAP 12 14    Recent Labs  Lab 12/09/19 0420  PROT 6.6  ALBUMIN 4.1  AST 46*  ALT 92*  ALKPHOS 74  BILITOT 1.2   Hematology Recent Labs  Lab 12/08/19 1631  WBC 7.7  RBC 4.47  HGB 12.7  HCT 41.0  MCV 91.7  MCH 28.4  MCHC 31.0  RDW 14.3  PLT 382   BNPNo results for input(s): BNP, PROBNP in the last 168 hours.  DDimer No results for input(s): DDIMER in the last 168 hours.   Radiology/Studies:   DG CHEST PORT 1 VIEW Result Date: 12/08/2019 CLINICAL DATA:  Shortness of breath. EXAM: PORTABLE CHEST 1 VIEW COMPARISON:  July 01, 2017. FINDINGS: Stable cardiomegaly. No pneumothorax or pleural effusion is noted. Both lungs are clear. The visualized skeletal structures are unremarkable. IMPRESSION: No active disease. Electronically Signed   By: Marijo Conception M.D.   On: 12/08/2019 18:59   {   Assessment and Plan:   1. AFlutter w/RVR 2. Afib by history as well     1:1 initially >> 2:1 currently     CHA2DS2Vasc is 4, on xarelto, appropriately dosed for calCrCl 40's   Lydia Myers has been described as permanent AFib though her last couple of EKGs have been SR Her previous AFlutter looked much different then this admission, was faster likely rate related bundle  Lydia Myers has felt poorly a couple weeks Lydia Myers is  certain that Lydia Myers has not missed any doses of her Xarelto in the last 3 weeks and longer I discussed DCCV as an option though Lydia Myers says "noone is going to shock my heart" I discussed EPS ablation.  Lydia Myers recall having this done before and is agreeable to this.  Dr. Lovena Le has seen and examined the patient, recommend/offerred EPS ablation, discussed the procedure, potential risks and benefits, Lydia Myers would like to proceed.   For questions or updates, please contact [place] Please consult [email or web address] for contact info under   Signed, Baldwin [place], PA-C  12/09/2019 9:36 AM  EP Attending  Patient seen and examined. Agree with above. The patient presented yesterday for evaluation of a narrow QRS tachycardia. Lydia Myers was treated with AV nodal blocking drugs demonstrating atrial flutter which was initially 1:1 and now mostly 2:1. Lydia Myers has also had atrial fib in the past but in fib her rates are very well controlled at least up to this point. Lydia Myers has associated sob. No syncope or chest pain. Lydia Myers has been on therapeutic anti-coagulation. I have discussed the indications/risks/benefits/goals/expectations of EP study and catheter ablation and Lydia Myers wishes to proceed.  Mikle Bosworth.D.

## 2019-12-09 NOTE — Consult Note (Addendum)
Cardiology Consultation:   Patient ID: Lydia Myers MRN: [id number]; DOB: 20-Mar-1941  Admit date: 12/08/2019 Date of Consult: 12/09/2019  Primary Care Provider: Kelton Pillar, MD Primary Electrophysiologist:  Dr. Rayann Heman   Patient Profile:   Lydia Myers is a 79 y.o. female with a hx of COPD, DM, HTN, HLD, vertigo, hypothyroidism, AFlutter (ablated 2011, Dr. Caryl Comes) and subsequent Afib who is being seen today for the evaluation of recurrent AFlutter at the request of Dr. Harl Bowie.  AFib,flutter hx AFlutter ablated 2011, Dr. Caryl Comes Recurrent AFlutter  (likely typical) w/1:1 conduction Aug 2018 treated with amio gtt, resumed a/c, pt declined repeat ablation procedure (Dr. Rayann Heman) Jan 2019 noted with Afib Sep 2019 >> rate controlled and asymptomatic with no plans for rhythm control  AAD hx Amiodarone started Aug 2018, stopped Jan 2019 with development of Afib  History of Present Illness:   [name] is followed by the AFib clinic and Dr. Rayann Heman outpatient, primarily the Afib clinic of late.  She had been offered re-do AFlutter ablation back in 2018 though declined and managed with amiodarone, jan 2019 she developed AFib and this was stopped. She had some chest pressure complaints at this time and declined ischemic evaluation. Sept 2019 again in rate controlled asymptomatic AFib with no plans for rhythm control.  Doing well until recently with a couple weeks of feeling poorly, progressivly SOB and aware of fast HR, she was seen as a work in at the AFib clinic for EKG, noted in [place] w/RVR, 202bpm and sent to he ER. Treated with IV dilt/gtt as well as IV lopressor that slowed her rates with flutter underlying rhythm and has been maintained on dilt gtt Given IV lasix EP is asked to revisit for possible flutter ablation (if ot agreeable) given AFib rates have been controlled historically  LABS K+ 4.8 Mag 1.8 BUN/Creat 24/1.29 WBC 7.7 H/H 12/41 Plts 382 TSH 2.335  HS Trop  16  COVID negative  Home rate controlling meds dilt 240mg  daily  Current  dilt gtt @ 15   She tells me about a week ago she noted her HR fast, and has had progressive weakness, SOB since then.  No CP.  No changes in medicines or recent illnesses.  She says everything had been fine for a couple years and all of a sudden started up again.  She can not think of any particular trigger.  She tells me she is very good about taking her medicines and is certain she has not missed any doses of any.  In particular asked about her xarelto and she is certain, has not missed a does in the last 3 weeks and longer   Heart Pathway Score:     Past Medical History:  Diagnosis Date  . Arthritis   . Asthma   . COPD (chronic obstructive pulmonary disease) ([place])   . Diabetes mellitus   . Glaucoma   . High cholesterol   . Hypertension     Past Surgical History:  Procedure Laterality Date  . ABDOMINAL HYSTERECTOMY    . HIP SURGERY    . JOINT REPLACEMENT    . left lumpectomy    . REPLACEMENT TOTAL KNEE BILATERAL     2009-Right, 2001-Left  . Right bog toe fusion    . Right trigger finger surgery       Home Medications:  Prior to Admission medications   Medication Sig Start Date End Date Taking? Authorizing Provider  atorvastatin (LIPITOR) 10 MG tablet TAKE 1 TABLET BY  MOUTH EVERY DAY AT 6PM Patient taking differently: Take 10 mg by mouth daily at 6 PM.  06/01/19  Yes Sherran Needs, NP  AZOPT 1 % ophthalmic suspension Place 1 drop into both eyes 2 (two) times daily. 05/01/17  Yes [provider]  clotrimazole-betamethasone (LOTRISONE) cream Apply 1 application topically daily as needed for irritation. Apply 1 application every day as needed for vaginal irritation 05/23/17  Yes [provider]  diltiazem (CARDIZEM CD) 240 MG 24 hr capsule TAKE 1 CAPSULE BY MOUTH EVERY DAY Patient taking differently: Take 240 mg by mouth daily.  04/06/19  Yes Sherran Needs, NP  furosemide  (LASIX) 20 MG tablet Take 1 tablet by mouth every 3 days. 09/23/18  Yes Sherran Needs, NP  JANUVIA 100 MG tablet Take 100 mg by mouth daily. 05/01/17  Yes [provider]  KLOR-CON M10 10 MEQ tablet TAKE 1 TABLET BY MOUTH EVERY DAY Patient taking differently: Take 10 mEq by mouth daily.  04/06/19  Yes Sherran Needs, NP  levothyroxine (SYNTHROID, LEVOTHROID) 25 MCG tablet Take 1 tablet (25 mcg total) by mouth daily before breakfast. 07/06/17  Yes Sheikh, Omair Latif, DO  metFORMIN (GLUCOPHAGE) 850 MG tablet Take 850 mg by mouth 2 (two) times daily.  06/12/17  Yes [provider]  olmesartan (BENICAR) 40 MG tablet Take 40 mg by mouth daily. 11/27/19  Yes [provider]  ramipril (ALTACE) 5 MG capsule Take 5 mg by mouth daily.   Yes [provider]  Rivaroxaban (XARELTO) 15 MG TABS tablet TAKE 1 TABLET BY MOUTH EVERY DAY WITH DINNER Patient taking differently: Take 15 mg by mouth daily with supper.  10/05/19  Yes Sherran Needs, NP  TRAVATAN Z 0.004 % SOLN ophthalmic solution Place 1 drop into both eyes every evening. 05/01/17  Yes [provider]  white petrolatum (VASELINE) GEL Apply 1 application topically as needed for lip care. 07/05/17  Yes Kerney Elbe, DO    Inpatient Medications: Scheduled Meds: . atorvastatin  10 mg Oral q1800  . brinzolamide  1 drop Both Eyes BID  . insulin aspart  0-15 Units Subcutaneous TID WC  . insulin aspart  0-5 Units Subcutaneous QHS  . latanoprost  1 drop Both Eyes QHS  . levothyroxine  25 mcg Oral QAC breakfast  . linagliptin  5 mg Oral Daily  . loratadine  10 mg Oral Daily  . potassium chloride  10 mEq Oral Daily  . Rivaroxaban  15 mg Oral Q supper   Continuous Infusions: . diltiazem (CARDIZEM) infusion 15 mg/hr (12/09/19 0827)   PRN Meds: acetaminophen, ALPRAZolam, metoprolol tartrate, nitroGLYCERIN, ondansetron (ZOFRAN) IV, white petrolatum, zolpidem  Allergies:    Allergies  Allergen Reactions   . Shrimp [Shellfish Allergy] Nausea And Vomiting    Social History:   Social History   Socioeconomic History  . Marital status: Single    Spouse name: Not on file  . Number of children: Not on file  . Years of education: Not on file  . Highest education level: Not on file  Occupational History  . Not on file  Tobacco Use  . Smoking status: Never Smoker  . Smokeless tobacco: Never Used  Substance and Sexual Activity  . Alcohol use: No  . Drug use: No  . Sexual activity: Never  Other Topics Concern  . Not on file  Social History Narrative  . Not on file   Social Determinants of Health   Financial Resource Strain:   .  Difficulty of Paying Living Expenses: Not on file  Food Insecurity:   . Worried About Charity fundraiser in the Last Year: Not on file  . Ran Out of Food in the Last Year: Not on file  Transportation Needs:   . Lack of Transportation (Medical): Not on file  . Lack of Transportation (Non-Medical): Not on file  Physical Activity:   . Days of Exercise per Week: Not on file  . Minutes of Exercise per Session: Not on file  Stress:   . Feeling of Stress : Not on file  Social Connections:   . Frequency of Communication with Friends and Family: Not on file  . Frequency of Social Gatherings with Friends and Family: Not on file  . Attends Religious Services: Not on file  . Active Member of Clubs or Organizations: Not on file  . Attends Archivist Meetings: Not on file  . Marital Status: Not on file  Intimate Partner Violence:   . Fear of Current or Ex-Partner: Not on file  . Emotionally Abused: Not on file  . Physically Abused: Not on file  . Sexually Abused: Not on file    Family History:   Family History  Problem Relation Age of Onset  . Diabetes Brother      ROS:  Please see the history of present illness.  All other ROS reviewed and negative.     Physical Exam/Data:   Vitals:   12/09/19 0730 12/09/19 0800 12/09/19 0830 12/09/19 0900   BP: 120/76 119/77 (!) 119/93 133/90  Pulse: 100 100 (!) 101 (!) 101  Resp: (!) 24 16 (!) 25 (!) 27  Temp:      TempSrc:      SpO2: 91% (!) 89% 92% (!) 89%  Weight:      Height:        Intake/Output Summary (Last 24 hours) at 12/09/2019 0936 Last data filed at 12/09/2019 0544 Gross per 24 hour  Intake --  Output 1700 ml  Net -1700 ml   Last 3 Weights 12/09/2019 12/08/2019 12/08/2019  Weight (lbs) 160 lb 12.8 oz 166 lb 166 lb  Weight (kg) 72.938 kg 75.297 kg 75.297 kg     Body mass index is 27.6 kg/m.  General:  Well nourished, well developed, looks winded but in no acute distress HEENT: normal Lymph: no adenopathy Neck: no JVD Endocrine:  No thryomegaly Vascular: No carotid bruits  Cardiac:  RRR; tachycardic no murmurs, gallops or rubs Lungs:  CTA b/l, no wheezing, rhonchi or rales  Abd: soft, nontender, obese Ext: trace if any edema Musculoskeletal:  No deformities, age appropriate atrophy Skin: warm and dry  Neuro:  No gross focal abnormalities noted Psych:  Normal affect     EKG:  The EKG was personally reviewed and demonstrates:    #1 SVT likely AFlutter 202bpm #2 Aflutter 2:1, 100bpm  07/31/2019 SR, PVCs (2) 07/31/2018 SR, PACs 11/11/2017 AFib 71 07/01/2017 SVT, RBBB morphology  Telemetry:  Telemetry was personally reviewed and demonstrates:    AFlutter, consistently about 100bpm 2:1     Relevant CV Studies:  07/02/2017: TTE Study Conclusions - Left ventricle: The cavity size was normal. Wall thickness was  increased in a pattern of mild LVH. Systolic function was normal.  The estimated ejection fraction was in the range of 55% to 60%.  Wall motion was normal; there were no regional wall motion  abnormalities. Features are consistent with a pseudonormal left  ventricular filling pattern, with concomitant abnormal relaxation  and increased filling pressure (grade 2 diastolic dysfunction).  - Ventricular septum: The contour showed diastolic  flattening.  - Mitral valve: There was moderate regurgitation.  - Left atrium: The atrium was mildly dilated.  - Right ventricle: The cavity size was mildly dilated. Wall  thickness was normal.  - Tricuspid valve: There was trivial regurgitation.  - Pulmonary arteries: Systolic pressure was severely increased. PA  peak pressure: 81 mm Hg (S).   Laboratory Data:  High Sensitivity Troponin:   Recent Labs  Lab 12/08/19 1827  TROPONINIHS 16     Chemistry Recent Labs  Lab 12/08/19 1827 12/09/19 0420  NA 140 139  K 4.9 4.8  CL 107 102  CO2 21* 23  GLUCOSE 142* 163*  BUN 29* 24*  CREATININE 1.53* 1.29*  CALCIUM 9.3 9.7  GFRNONAA 32* 39*  GFRAA 37* 45*  ANIONGAP 12 14    Recent Labs  Lab 12/09/19 0420  PROT 6.6  ALBUMIN 4.1  AST 46*  ALT 92*  ALKPHOS 74  BILITOT 1.2   Hematology Recent Labs  Lab 12/08/19 1631  WBC 7.7  RBC 4.47  HGB 12.7  HCT 41.0  MCV 91.7  MCH 28.4  MCHC 31.0  RDW 14.3  PLT 382   BNPNo results for input(s): BNP, PROBNP in the last 168 hours.  DDimer No results for input(s): DDIMER in the last 168 hours.   Radiology/Studies:   DG CHEST PORT 1 VIEW Result Date: 12/08/2019 CLINICAL DATA:  Shortness of breath. EXAM: PORTABLE CHEST 1 VIEW COMPARISON:  July 01, 2017. FINDINGS: Stable cardiomegaly. No pneumothorax or pleural effusion is noted. Both lungs are clear. The visualized skeletal structures are unremarkable. IMPRESSION: No active disease. Electronically Signed   By: Marijo Conception M.D.   On: 12/08/2019 18:59   {   Assessment and Plan:   1. AFlutter w/RVR 2. Afib by history as well     1:1 initially >> 2:1 currently     CHA2DS2Vasc is 4, on xarelto, appropriately dosed for calCrCl 40's   She has been described as permanent AFib though her last couple of EKGs have been SR Her previous AFlutter looked much different then this admission, was faster likely rate related bundle  She has felt poorly a couple weeks She is  certain that she has not missed any doses of her Xarelto in the last 3 weeks and longer I discussed DCCV as an option though she says "noone is going to shock my heart" I discussed EPS ablation.  She recall having this done before and is agreeable to this.  Dr. Lovena Le has seen and examined the patient, recommend/offerred EPS ablation, discussed the procedure, potential risks and benefits, she would like to proceed.   For questions or updates, please contact [place] Please consult [email or web address] for contact info under   Signed, Baldwin [place], PA-C  12/09/2019 9:36 AM  EP Attending  Patient seen and examined. Agree with above. The patient presented yesterday for evaluation of a narrow QRS tachycardia. She was treated with AV nodal blocking drugs demonstrating atrial flutter which was initially 1:1 and now mostly 2:1. She has also had atrial fib in the past but in fib her rates are very well controlled at least up to this point. She has associated sob. No syncope or chest pain. She has been on therapeutic anti-coagulation. I have discussed the indications/risks/benefits/goals/expectations of EP study and catheter ablation and she wishes to proceed.  Mikle Bosworth.D.

## 2019-12-09 NOTE — Interval H&P Note (Signed)
History and Physical Interval Note:  12/09/2019 12:49 PM  Lydia Myers  has presented today for surgery, with the diagnosis of Atrail flutter.  The various methods of treatment have been discussed with the patient and family. After consideration of risks, benefits and other options for treatment, the patient has consented to  Procedure(s): A-FLUTTER ABLATION (N/A) as a surgical intervention.  The patient's history has been reviewed, patient examined, no change in status, stable for surgery.  I have reviewed the patient's chart and labs.  Questions were answered to the patient's satisfaction.     Cristopher Peru

## 2019-12-09 NOTE — Discharge Instructions (Addendum)
After Your Pacemaker  . You have a Medtronic Pacemaker  . Do not lift your arm above shoulder height for 1 week after your procedure. After 7 days, you may progress as below.     Monday December 21, 2019  Tuesday December 22, 2019 Wednesday December 23, 2019 Thursday December 24, 2019   . Do not lift, push, pull, or carry anything over 10 pounds with the affected arm until 6 weeks (Tuesday January 26, 2020) after your procedure.   . Do not drive until your wound check, or until instructed by your healthcare provider that you are safe to do so.   . Monitor your pacemaker site for redness, swelling, and drainage. Call the device clinic at [phone] if you experience these symptoms or fever/chills.  . If your incision is sealed with Steri-strips or staples, you may shower 7 days after your procedure. Do not remove the steri-strips or let the shower hit directly on your site. You may wash around your site with soap and water. If your incision is closed with Dermabond/Surgical glue. You may shower 1 day after your pacemaker implant and wash around the site with soap and water. Avoid lotions, ointments, or perfumes over your incision until it is well-healed.  . You may use a hot tub or a pool AFTER your wound check appointment if the incision is completely closed.  . Your Pacemaker may be MRI compatible. We will discuss this at your first follow up/wound check. .   . Remote monitoring is used to monitor your pacemaker from home. This monitoring is scheduled every 91 days by our office. It allows [place] to keep an eye on the functioning of your device to ensure it is working properly. You will routinely see your Electrophysiologist annually (more often if necessary).    Pacemaker Implantation, Care After This sheet gives you information about how to care for yourself after your procedure. Your health care provider may also give you more specific instructions. If you have problems or questions, contact  your health care provider. What can I expect after the procedure? After the procedure, it is common to have:  Mild pain.  Slight bruising.  Some swelling over the incision.  A slight bump over the skin where the device was placed. Sometimes, it is possible to feel the device under the skin. This is normal.  You should received your Pacemaker ID card within 4-8 weeks. Follow these instructions at home: Medicines  Take over-the-counter and prescription medicines only as told by your health care provider.  If you were prescribed an antibiotic medicine, take it as told by your health care provider. Do not stop taking the antibiotic even if you start to feel better. Wound care     Do not remove the bandage on your chest until directed to do so by your health care provider.  After your bandage is removed, you may see pieces of tape called skin adhesive strips over the area where the cut was made (incision site). Let them fall off on their own.  Check the incision site every day to make sure it is not infected, bleeding, or starting to pull apart.  Do not use lotions or ointments near the incision site unless directed to do so.  Keep the incision area clean and dry for 7 days after the procedure or as directed by your health care provider. It takes several weeks for the incision site to completely heal.  Do not take baths, swim, or use a  hot tub for 7-10 days or as otherwise directed by your health care provider. Activity  Do not drive or use heavy machinery while taking prescription pain medicine.  Do not drive for 24 hours if you were given a medicine to help you relax (sedative).  Check with your health care provider before you start to drive or play sports.  Avoid sudden jerking, pulling, or chopping movements that pull your upper arm far away from your body. Avoid these movements for at least 6 weeks or as long as told by your health care provider.  Do not lift your upper  arm above your shoulders for at least 6 weeks or as long as told by your health care provider. This means no tennis, golf, or swimming.  You may go back to work when your health care provider says it is okay. Pacemaker care  You may be shown how to transfer data from your pacemaker through the phone to your health care provider.  Always let all health care providers know about your pacemaker before you have any medical procedures or tests.  Wear a medical ID bracelet or necklace stating that you have a pacemaker. Carry a pacemaker ID card with you at all times.  Your pacemaker battery will last for 5-15 years. Routine checks by your health care provider will let the health care provider know when the battery is starting to run down. The pacemaker will need to be replaced when the battery starts to run down.  Do not use amateur [employer]. Other electrical devices are safe to use, including power tools, lawn mowers, and speakers. If you are unsure of whether something is safe to use, ask your health care provider.  When using your cell phone, hold it to the ear opposite the pacemaker. Do not leave your cell phone in a pocket over the pacemaker.  Avoid places or objects that have a strong electric or magnetic field, including: ? [employer]. When at the airport, let officials know that you have a pacemaker. ? Power plants. ? Large electrical generators. ? Radiofrequency transmission towers, such as cell phone and radio towers. General instructions  Weigh yourself every day. If you suddenly gain weight, fluid may be building up in your body.  Keep all follow-up visits as told by your health care provider. This is important. Contact a health care provider if:  You gain weight suddenly.  Your legs or feet swell.  It feels like your heart is fluttering or skipping beats (heart palpitations).  You have chills or a fever.  You have more  redness, swelling, or pain around your incisions.  You have more fluid or blood coming from your incisions.  Your incisions feel warm to the touch.  You have pus or a bad smell coming from your incisions. Get help right away if:  You have chest pain.  You have trouble breathing or are short of breath.  You become extremely tired.  You are light-headed or you faint. This information is not intended to replace advice given to you by your health care provider. Make sure you discuss any questions you have with your health care provider.  Cardiac Ablation, Care After  This sheet gives you information about how to care for yourself after your procedure. Your health care provider may also give you more specific instructions. If you have problems or questions, contact your health care provider. What can I expect after the procedure? After the procedure, it  is common to have:  Bruising around your puncture site.  Tenderness around your puncture site.  Skipped heartbeats.  Tiredness (fatigue).  Follow these instructions at home: Puncture site care   Follow instructions from your health care provider about how to take care of your puncture site. Make sure you: ? If present, leave stitches (sutures), skin glue, or adhesive strips in place. These skin closures may need to stay in place for up to 2 weeks. If adhesive strip edges start to loosen and curl up, you may trim the loose edges. Do not remove adhesive strips completely unless your health care provider tells you to do that.  Check your puncture site every day for signs of infection. Check for: ? Redness, swelling, or pain. ? Fluid or blood. If your puncture site starts to bleed, lie down on your back, apply firm pressure to the area, and contact your health care provider. ? Warmth. ? Pus or a bad smell. Driving  Do not drive for at least 4 days after your procedure or however long your health care provider recommends. (Do not  resume driving if you have previously been instructed not to drive for other health reasons.)  Do not drive or use heavy machinery while taking prescription pain medicine. Activity  Avoid activities that take a lot of effort for at least 7 days after your procedure.  Do not lift anything that is heavier than 5 lb (4.5 kg) for one week.   No sexual activity for 1 week.   Return to your normal activities as told by your health care provider. Ask your health care provider what activities are safe for you. General instructions  Take over-the-counter and prescription medicines only as told by your health care provider.  Do not use any products that contain nicotine or tobacco, such as cigarettes and e-cigarettes. If you need help quitting, ask your health care provider.  You may shower after 24 hours, but Do not take baths, swim, or use a hot tub for 1 week.   Do not drink alcohol for 24 hours after your procedure.  Keep all follow-up visits as told by your health care provider. This is important. Contact a health care provider if:  You have redness, mild swelling, or pain around your puncture site.  You have fluid or blood coming from your puncture site that stops after applying firm pressure to the area.  Your puncture site feels warm to the touch.  You have pus or a bad smell coming from your puncture site.  You have a fever.  You have chest pain or discomfort that spreads to your neck, jaw, or arm.  You are sweating a lot.  You feel nauseous.  You have a fast or irregular heartbeat.  You have shortness of breath.  You are dizzy or light-headed and feel the need to lie down.  You have pain or numbness in the arm or leg closest to your puncture site. Get help right away if:  Your puncture site suddenly swells.  Your puncture site is bleeding and the bleeding does not stop after applying firm pressure to the area. These symptoms may represent a serious problem that is  an emergency. Do not wait to see if the symptoms will go away. Get medical help right away. Call your local emergency services (911 in the [place] Do not drive yourself to the hospital. Summary  After the procedure, it is normal to have bruising and tenderness at the puncture site in your  groin, neck, or forearm.  Check your puncture site every day for signs of infection.  Get help right away if your puncture site is bleeding and the bleeding does not stop after applying firm pressure to the area. This is a medical emergency. This information is not intended to replace advice given to you by your health care provider. Make sure you discuss any questions you have with your health care provider.   Heart-Healthy Eating Plan Heart-healthy meal planning includes:  Eating less unhealthy fats.  Eating more healthy fats.  Making other changes in your diet. Talk with your doctor or a diet specialist (dietitian) to create an eating plan that is right for you. What is my plan? Your doctor may recommend an eating plan that includes:  Total fat: ______% or less of total calories a day.  Saturated fat: ______% or less of total calories a day.  Cholesterol: less than _________mg a day. What are tips for following this plan? Cooking Avoid frying your food. Try to bake, boil, grill, or broil it instead. You can also reduce fat by:  Removing the skin from poultry.  Removing all visible fats from meats.  Steaming vegetables in water or broth. Meal planning   At meals, divide your plate into four equal parts: ? Fill one-half of your plate with vegetables and green salads. ? Fill one-fourth of your plate with whole grains. ? Fill one-fourth of your plate with lean protein foods.  Eat 4-5 servings of vegetables per day. A serving of vegetables is: ? 1 cup of raw or cooked vegetables. ? 2 cups of raw leafy greens.  Eat 4-5 servings of fruit per day. A serving of fruit is: ? 1 medium whole  fruit. ?  cup of dried fruit. ?  cup of fresh, frozen, or canned fruit. ?  cup of 100% fruit juice.  Eat more foods that have soluble fiber. These are apples, broccoli, carrots, beans, peas, and barley. Try to get 20-30 g of fiber per day.  Eat 4-5 servings of nuts, legumes, and seeds per week: ? 1 serving of dried beans or legumes equals  cup after being cooked. ? 1 serving of nuts is  cup. ? 1 serving of seeds equals 1 tablespoon. General information  Eat more home-cooked food. Eat less restaurant, buffet, and fast food.  Limit or avoid alcohol.  Limit foods that are high in starch and sugar.  Avoid fried foods.  Lose weight if you are overweight.  Keep track of how much salt (sodium) you eat. This is important if you have high blood pressure. Ask your doctor to tell you more about this.  Try to add vegetarian meals each week. Fats  Choose healthy fats. These include olive oil and canola oil, flaxseeds, walnuts, almonds, and seeds.  Eat more omega-3 fats. These include salmon, mackerel, sardines, tuna, flaxseed oil, and ground flaxseeds. Try to eat fish at least 2 times each week.  Check food labels. Avoid foods with trans fats or high amounts of saturated fat.  Limit saturated fats. ? These are often found in animal products, such as meats, butter, and cream. ? These are also found in plant foods, such as palm oil, palm kernel oil, and coconut oil.  Avoid foods with partially hydrogenated oils in them. These have trans fats. Examples are stick margarine, some tub margarines, cookies, crackers, and other baked goods. What foods can I eat? Fruits All fresh, canned (in natural juice), or frozen fruits. Vegetables Fresh or  frozen vegetables (raw, steamed, roasted, or grilled). Green salads. Grains Most grains. Choose whole wheat and whole grains most of the time. Rice and pasta, including brown rice and pastas made with whole wheat. Meats and other proteins Lean,  well-trimmed beef, veal, pork, and lamb. Chicken and [place] without skin. All fish and shellfish. Wild duck, rabbit, pheasant, and venison. Egg whites or low-cholesterol egg substitutes. Dried beans, peas, lentils, and tofu. Seeds and most nuts. Dairy Low-fat or nonfat cheeses, including ricotta and mozzarella. Skim or 1% milk that is liquid, powdered, or evaporated. Buttermilk that is made with low-fat milk. Nonfat or low-fat yogurt. Fats and oils Non-hydrogenated (trans-free) margarines. Vegetable oils, including soybean, sesame, sunflower, olive, peanut, safflower, corn, canola, and cottonseed. Salad dressings or mayonnaise made with a vegetable oil. Beverages Mineral water. Coffee and tea. Diet carbonated beverages. Sweets and desserts Sherbet, gelatin, and fruit ice. Small amounts of dark chocolate. Limit all sweets and desserts. Seasonings and condiments All seasonings and condiments. The items listed above may not be a complete list of foods and drinks you can eat. Contact a dietitian for more options. What foods should I avoid? Fruits Canned fruit in heavy syrup. Fruit in cream or butter sauce. Fried fruit. Limit coconut. Vegetables Vegetables cooked in cheese, cream, or butter sauce. Fried vegetables. Grains Breads that are made with saturated or trans fats, oils, or whole milk. Croissants. Sweet rolls. Donuts. High-fat crackers, such as cheese crackers. Meats and other proteins Fatty meats, such as hot dogs, ribs, sausage, bacon, rib-eye roast or steak. High-fat deli meats, such as salami and bologna. Caviar. Domestic duck and goose. Organ meats, such as liver. Dairy Cream, sour cream, cream cheese, and creamed cottage cheese. Whole-milk cheeses. Whole or 2% milk that is liquid, evaporated, or condensed. Whole buttermilk. Cream sauce or high-fat cheese sauce. Yogurt that is made from whole milk. Fats and oils Meat fat, or shortening. Cocoa butter, hydrogenated oils, palm oil,  coconut oil, palm kernel oil. Solid fats and shortenings, including bacon fat, salt pork, lard, and butter. Nondairy cream substitutes. Salad dressings with cheese or sour cream. Beverages Regular sodas and juice drinks with added sugar. Sweets and desserts Frosting. Pudding. Cookies. Cakes. Pies. Milk chocolate or white chocolate. Buttered syrups. Full-fat ice cream or ice cream drinks. The items listed above may not be a complete list of foods and drinks to avoid. Contact a dietitian for more information. Summary  Heart-healthy meal planning includes eating less unhealthy fats, eating more healthy fats, and making other changes in your diet.  Eat a balanced diet. This includes fruits and vegetables, low-fat or nonfat dairy, lean protein, nuts and legumes, whole grains, and heart-healthy oils and fats. This information is not intended to replace advice given to you by your health care provider. Make sure you discuss any questions you have with your health care provider. Document Revised: 12/26/2017 Document Reviewed: 11/29/2017 Elsevier Patient Education  [place].   Diabetes Mellitus and Nutrition, Adult When you have diabetes (diabetes mellitus), it is very important to have healthy eating habits because your blood sugar (glucose) levels are greatly affected by what you eat and drink. Eating healthy foods in the appropriate amounts, at about the same times every day, can help you:  Control your blood glucose.  Lower your risk of heart disease.  Improve your blood pressure.  Reach or maintain a healthy weight. Every person with diabetes is different, and each person has different needs for a meal plan. Your health care  provider may recommend that you work with a diet and nutrition specialist (dietitian) to make a meal plan that is best for you. Your meal plan may vary depending on factors such as:  The calories you need.  The medicines you take.  Your weight.  Your blood  glucose, blood pressure, and cholesterol levels.  Your activity level.  Other health conditions you have, such as heart or kidney disease. How do carbohydrates affect me? Carbohydrates, also called carbs, affect your blood glucose level more than any other type of food. Eating carbs naturally raises the amount of glucose in your blood. Carb counting is a method for keeping track of how many carbs you eat. Counting carbs is important to keep your blood glucose at a healthy level, especially if you use insulin or take certain oral diabetes medicines. It is important to know how many carbs you can safely have in each meal. This is different for every person. Your dietitian can help you calculate how many carbs you should have at each meal and for each snack. Foods that contain carbs include:  Bread, cereal, rice, pasta, and crackers.  Potatoes and corn.  Peas, beans, and lentils.  Milk and yogurt.  Fruit and juice.  Desserts, such as cakes, cookies, ice cream, and candy. How does alcohol affect me? Alcohol can cause a sudden decrease in blood glucose (hypoglycemia), especially if you use insulin or take certain oral diabetes medicines. Hypoglycemia can be a life-threatening condition. Symptoms of hypoglycemia (sleepiness, dizziness, and confusion) are similar to symptoms of having too much alcohol. If your health care provider says that alcohol is safe for you, follow these guidelines:  Limit alcohol intake to no more than 1 drink per day for nonpregnant women and 2 drinks per day for men. One drink equals 12 oz of beer, 5 oz of wine, or 1 oz of hard liquor.  Do not drink on an empty stomach.  Keep yourself hydrated with water, diet soda, or unsweetened iced tea.  Keep in mind that regular soda, juice, and other mixers may contain a lot of sugar and must be counted as carbs. What are tips for following this plan?  Reading food labels  Start by checking the serving size on the  "Nutrition Facts" label of packaged foods and drinks. The amount of calories, carbs, fats, and other nutrients listed on the label is based on one serving of the item. Many items contain more than one serving per package.  Check the total grams (g) of carbs in one serving. You can calculate the number of servings of carbs in one serving by dividing the total carbs by 15. For example, if a food has 30 g of total carbs, it would be equal to 2 servings of carbs.  Check the number of grams (g) of saturated and trans fats in one serving. Choose foods that have low or no amount of these fats.  Check the number of milligrams (mg) of salt (sodium) in one serving. Most people should limit total sodium intake to less than 2,300 mg per day.  Always check the nutrition information of foods labeled as "low-fat" or "nonfat". These foods may be higher in added sugar or refined carbs and should be avoided.  Talk to your dietitian to identify your daily goals for nutrients listed on the label. Shopping  Avoid buying canned, premade, or processed foods. These foods tend to be high in fat, sodium, and added sugar.  Shop around the outside edge of  the grocery store. This includes fresh fruits and vegetables, bulk grains, fresh meats, and fresh dairy. Cooking  Use low-heat cooking methods, such as baking, instead of high-heat cooking methods like deep frying.  Cook using healthy oils, such as olive, canola, or sunflower oil.  Avoid cooking with butter, cream, or high-fat meats. Meal planning  Eat meals and snacks regularly, preferably at the same times every day. Avoid going long periods of time without eating.  Eat foods high in fiber, such as fresh fruits, vegetables, beans, and whole grains. Talk to your dietitian about how many servings of carbs you can eat at each meal.  Eat 4-6 ounces (oz) of lean protein each day, such as lean meat, chicken, fish, eggs, or tofu. One oz of lean protein is equal  to: ? 1 oz of meat, chicken, or fish. ? 1 egg. ?  cup of tofu.  Eat some foods each day that contain healthy fats, such as avocado, nuts, seeds, and fish. Lifestyle  Check your blood glucose regularly.  Exercise regularly as told by your health care provider. This may include: ? 150 minutes of moderate-intensity or vigorous-intensity exercise each week. This could be brisk walking, biking, or water aerobics. ? Stretching and doing strength exercises, such as yoga or weightlifting, at least 2 times a week.  Take medicines as told by your health care provider.  Do not use any products that contain nicotine or tobacco, such as cigarettes and e-cigarettes. If you need help quitting, ask your health care provider.  Work with a Social worker or diabetes educator to identify strategies to manage stress and any emotional and social challenges. Questions to ask a health care provider  Do I need to meet with a diabetes educator?  Do I need to meet with a dietitian?  What number can I call if I have questions?  When are the best times to check my blood glucose? Where to find more information:  American Diabetes Association: diabetes.org  Academy of Nutrition and Dietetics: www.eatright.[employer] of Diabetes and Digestive and Kidney Diseases (NIH): [employer] Summary  A healthy meal plan will help you control your blood glucose and maintain a healthy lifestyle.  Working with a diet and nutrition specialist (dietitian) can help you make a meal plan that is best for you.  Keep in mind that carbohydrates (carbs) and alcohol have immediate effects on your blood glucose levels. It is important to count carbs and to use alcohol carefully. This information is not intended to replace advice given to you by your health care provider. Make sure you discuss any questions you have with your health care provider. Document Revised: 10/04/2017 Document Reviewed: 11/26/2016 Elsevier  Patient Education  2020 [place] on my medicine - XARELTO (Rivaroxaban)  This medication education was reviewed with me or my healthcare representative as part of my discharge preparation.    Why was Xarelto prescribed for you? Xarelto was prescribed for you to reduce the risk of a blood clot forming that can cause a stroke if you have a medical condition called atrial fibrillation (a type of irregular heartbeat).  What do you need to know about xarelto ? Take your Xarelto ONCE DAILY at the same time every day with your evening meal. If you have difficulty swallowing the tablet whole, you may crush it and mix in applesauce just prior to taking your dose.  Take Xarelto exactly as prescribed by your doctor and DO NOT stop taking Xarelto without  talking to the doctor who prescribed the medication.  Stopping without other stroke prevention medication to take the place of Xarelto may increase your risk of developing a clot that causes a stroke.  Refill your prescription before you run out.  After discharge, you should have regular check-up appointments with your healthcare provider that is prescribing your Xarelto.  In the future your dose may need to be changed if your kidney function or weight changes by a significant amount.  What do you do if you miss a dose? If you are taking Xarelto ONCE DAILY and you miss a dose, take it as soon as you remember on the same day then continue your regularly scheduled once daily regimen the next day. Do not take two doses of Xarelto at the same time or on the same day.   Important Safety Information A possible side effect of Xarelto is bleeding. You should call your healthcare provider right away if you experience any of the following: ? Bleeding from an injury or your nose that does not stop. ? Unusual colored urine (red or dark brown) or unusual colored stools (red or black). ? Unusual bruising for unknown reasons. ? A  serious fall or if you hit your head (even if there is no bleeding).  Some medicines may interact with Xarelto and might increase your risk of bleeding while on Xarelto. To help avoid this, consult your healthcare provider or pharmacist prior to using any new prescription or non-prescription medications, including herbals, vitamins, non-steroidal anti-inflammatory drugs (NSAIDs) and supplements.  This website has more information on Xarelto: [email or web address]

## 2019-12-10 ENCOUNTER — Inpatient Hospital Stay (HOSPITAL_COMMUNITY): Payer: Medicare Other

## 2019-12-10 DIAGNOSIS — I340 Nonrheumatic mitral (valve) insufficiency: Secondary | ICD-10-CM

## 2019-12-10 DIAGNOSIS — I361 Nonrheumatic tricuspid (valve) insufficiency: Secondary | ICD-10-CM

## 2019-12-10 DIAGNOSIS — I5031 Acute diastolic (congestive) heart failure: Secondary | ICD-10-CM

## 2019-12-10 DIAGNOSIS — I371 Nonrheumatic pulmonary valve insufficiency: Secondary | ICD-10-CM

## 2019-12-10 DIAGNOSIS — I483 Typical atrial flutter: Principal | ICD-10-CM

## 2019-12-10 LAB — GLUCOSE, CAPILLARY
Glucose-Capillary: 125 mg/dL — ABNORMAL HIGH (ref 70–99)
Glucose-Capillary: 202 mg/dL — ABNORMAL HIGH (ref 70–99)
Glucose-Capillary: 191 mg/dL — ABNORMAL HIGH (ref 70–99)
Glucose-Capillary: 227 mg/dL — ABNORMAL HIGH (ref 70–99)

## 2019-12-10 LAB — ECHOCARDIOGRAM COMPLETE
Height: 64 in
Weight: 2444.8 oz

## 2019-12-10 LAB — BASIC METABOLIC PANEL
Anion gap: 9 (ref 5–15)
BUN: 22 mg/dL (ref 8–23)
CO2: 24 mmol/L (ref 22–32)
Calcium: 8.9 mg/dL (ref 8.9–10.3)
Chloride: 105 mmol/L (ref 98–111)
Creatinine, Ser: 1.41 mg/dL — ABNORMAL HIGH (ref 0.44–1.00)
GFR calc Af Amer: 41 mL/min — ABNORMAL LOW (ref >60)
GFR calc non Af Amer: 35 mL/min — ABNORMAL LOW (ref >60)
Glucose, Bld: 139 mg/dL — ABNORMAL HIGH (ref 70–99)
Potassium: 4.8 mmol/L (ref 3.5–5.1)
Sodium: 138 mmol/L (ref 135–145)

## 2019-12-10 MED ORDER — CARVEDILOL 6.25 MG PO TABS
6.2500 mg | ORAL_TABLET | Freq: Two times a day (BID) | ORAL | Status: DC
  Administered 2019-12-10: 17:00:00 6.25 mg via ORAL
  Filled 2019-12-10: qty 1

## 2019-12-10 MED ORDER — FUROSEMIDE 40 MG PO TABS
40.0000 mg | ORAL_TABLET | Freq: Every day | ORAL | Status: DC
  Administered 2019-12-10: 09:00:00 40 mg via ORAL
  Filled 2019-12-10: qty 1

## 2019-12-10 MED ORDER — FUROSEMIDE 40 MG PO TABS
40.0000 mg | ORAL_TABLET | Freq: Two times a day (BID) | ORAL | Status: DC
  Administered 2019-12-10: 40 mg via ORAL
  Filled 2019-12-10: qty 1

## 2019-12-10 MED ORDER — LINAGLIPTIN 5 MG PO TABS
5.0000 mg | ORAL_TABLET | Freq: Every day | ORAL | Status: DC
  Administered 2019-12-10 – 2019-12-14 (×5): 5 mg via ORAL
  Filled 2019-12-10 (×5): qty 1

## 2019-12-10 MED ORDER — DILTIAZEM HCL ER COATED BEADS 240 MG PO CP24
240.0000 mg | ORAL_CAPSULE | Freq: Every day | ORAL | Status: DC
  Administered 2019-12-10: 09:00:00 240 mg via ORAL
  Filled 2019-12-10: qty 1

## 2019-12-10 NOTE — Progress Notes (Addendum)
Progress Note  Patient Name: Lydia Myers Date of Encounter: 12/10/2019  Primary Cardiologist: Dr. [facility]  Subjective   Feeling better, very happy to see Dr. Lovena Le this AM.  No CP or groin discomfort.  She mentions SOB last night but better this AM  Inpatient Medications    Scheduled Meds: . atorvastatin  10 mg Oral q1800  . brinzolamide  1 drop Both Eyes BID  . diltiazem  240 mg Oral Daily  . furosemide  40 mg Oral Daily  . insulin aspart  0-15 Units Subcutaneous TID WC  . insulin aspart  0-5 Units Subcutaneous QHS  . latanoprost  1 drop Both Eyes QHS  . levothyroxine  25 mcg Oral QAC breakfast  . linagliptin  5 mg Oral Daily  . loratadine  10 mg Oral Daily  . potassium chloride  10 mEq Oral Daily  . Rivaroxaban  15 mg Oral Q supper  . sodium chloride flush  3 mL Intravenous Q12H   Continuous Infusions: . sodium chloride     PRN Meds: sodium chloride, acetaminophen, ALPRAZolam, metoprolol tartrate, nitroGLYCERIN, ondansetron (ZOFRAN) IV, sodium chloride flush, white petrolatum, zolpidem   Vital Signs    Vitals:   12/09/19 2025 12/09/19 2119 12/10/19 0430 12/10/19 0609  BP: 122/87   130/84  Pulse: 97 82  91  Resp:    19  Temp:  98.2 F (36.8 C)  98.1 F (36.7 C)  TempSrc:  Oral  Oral  SpO2: (!) 86% 93%  96%  Weight:   69.3 kg   Height:        Intake/Output Summary (Last 24 hours) at 12/10/2019 0926 Last data filed at 12/10/2019 0500 Gross per 24 hour  Intake 100 ml  Output 850 ml  Net -750 ml   Last 3 Weights 12/10/2019 12/09/2019 12/08/2019  Weight (lbs) 152 lb 12.8 oz 160 lb 12.8 oz 166 lb  Weight (kg) 69.31 kg 72.938 kg 75.297 kg      Telemetry    SR 80's-90's PACs at times are frequent - Personally Reviewed  ECG    SR, 95bpm, PACs - Personally Reviewed  Physical Exam   GEN: No acute distress.   Neck: No JVD Cardiac: RRR, no murmurs, rubs, or gallops.  Respiratory: CTA b/l. GI: Soft, nontender, non-distended  MS: No edema; No  deformity. Neuro:  Nonfocal  Psych: Normal affect   Labs    High Sensitivity Troponin:   Recent Labs  Lab 12/08/19 1827  TROPONINIHS 16      Chemistry Recent Labs  Lab 12/08/19 1827 12/09/19 0420 12/10/19 0816  NA 140 139 138  K 4.9 4.8 4.8  CL 107 102 105  CO2 21* 23 24  GLUCOSE 142* 163* 139*  BUN 29* 24* 22  CREATININE 1.53* 1.29* 1.41*  CALCIUM 9.3 9.7 8.9  PROT  --  6.6  --   ALBUMIN  --  4.1  --   AST  --  46*  --   ALT  --  92*  --   ALKPHOS  --  74  --   BILITOT  --  1.2  --   GFRNONAA 32* 39* 35*  GFRAA 37* 45* 41*  ANIONGAP 12 14 9      Hematology Recent Labs  Lab 12/08/19 1631  WBC 7.7  RBC 4.47  HGB 12.7  HCT 41.0  MCV 91.7  MCH 28.4  MCHC 31.0  RDW 14.3  PLT 382    BNPNo results for input(s):  BNP, PROBNP in the last 168 hours.   DDimer No results for input(s): DDIMER in the last 168 hours.   Radiology     DG CHEST PORT 1 VIEW Result Date: 12/08/2019 CLINICAL DATA:  Shortness of breath. EXAM: PORTABLE CHEST 1 VIEW COMPARISON:  July 01, 2017. FINDINGS: Stable cardiomegaly. No pneumothorax or pleural effusion is noted. Both lungs are clear. The visualized skeletal structures are unremarkable. IMPRESSION: No active disease. Electronically Signed   By: Marijo Conception M.D.   On: 12/08/2019 18:59      Cardiac Studies   EP STUDY Result Date: 12/09/2019 CONCLUSIONS: 1. Isthmus-dependent right atrial flutter upon presentation. 2. Successful radiofrequency ablation of atrial flutter along the cavotricuspid isthmus with complete bidirectional isthmus block achieved. 3. No inducible arrhythmias following ablation. 4. No early apparent complications. Cristopher Peru, MD 4:53 PM 12/09/2019    12/10/2019: TTE IMPRESSIONS  1. Left ventricular ejection fraction, by visual estimation, is 30 to  35%. The left ventricle has moderate to severely decreased function. There  is no left ventricular hypertrophy.  2. Mildly dilated left ventricular internal  cavity size.  3. The left ventricle demonstrates regional wall motion abnormalities.  4. Septal apical and inferior wall hypokinesis. Focal area of mid  inferior wall akinesis with no obvious area of rupture.  5. Global right ventricle has normal systolic function.The right  ventricular size is normal. No increase in right ventricular wall  thickness.  6. Left atrial size was mildly dilated.  7. Right atrial size was normal.  8. Small pericardial effusion.  9. The pericardial effusion is posterior to the left ventricle and  lateral to the left ventricle.  10. Small to moderate nearly circumferential effusion IVC dilated no  diastolic RV collapse.  11. The mitral valve is normal in structure. Mild mitral valve  regurgitation. No evidence of mitral stenosis.  12. The tricuspid valve is normal in structure.  13. The tricuspid valve is normal in structure. Tricuspid valve  regurgitation is mild.  14. The aortic valve is tricuspid. Aortic valve regurgitation is not  visualized. Mild aortic valve sclerosis without stenosis.  15. Pulmonic regurgitation is mild.  16. The pulmonic valve was normal in structure. Pulmonic valve  regurgitation is mild.  17. Moderately elevated pulmonary artery systolic pressure.  18. The inferior vena cava is normal in size with greater than 50%  respiratory variability, suggesting right atrial pressure of 3 mmHg.     07/02/2017: TTE Study Conclusions - Left ventricle: The cavity size was normal. Wall thickness was  increased in a pattern of mild LVH. Systolic function was normal.  The estimated ejection fraction was in the range of 55% to 60%.  Wall motion was normal; there were no regional wall motion  abnormalities. Features are consistent with a pseudonormal left  ventricular filling pattern, with concomitant abnormal relaxation  and increased filling pressure (grade 2 diastolic dysfunction).  - Ventricular septum: The contour showed  diastolic flattening.  - Mitral valve: There was moderate regurgitation.  - Left atrium: The atrium was mildly dilated.  - Right ventricle: The cavity size was mildly dilated. Wall  thickness was normal.  - Tricuspid valve: There was trivial regurgitation.  - Pulmonary arteries: Systolic pressure was severely increased. PA  peak pressure: 81 mm Hg (S).   Patient Profile     79 y.o. female with a hx of COPD, DM, HTN, HLD, vertigo, hypothyroidism, AFlutter (ablated 2011, Dr. Caryl Comes) and subsequent Afib admitted with AFlutter w/RVR, conducting 1:1 at  202bpm  Assessment & Plan    1. AFlutter w/RVR 2. Afib by history as well     1:1 initially >> 2:1 with dilt gtt     CHA2DS2Vasc is 4, on xarelto, appropriately dosed for calCrCl 40's   She has been described as permanent AFib though her last couple of EKGs have been SR Her previous AFlutter looked much different then this admission, was faster likely rate related bundle She has felt poorly a couple weeks She reported being certain that she has not missed any doses of her Xarelto in the last 3 weeks and longer  She underwent EPS/ablation yesterday with Dr. Lovena Le Maintaining SR with PACs     3. New CM     LVEF 30-35% as well as some WMA noted     Given her marked RVR and a couple weeks of symptoms, suspect this is  Tachy mediated     She had no c/o CP and with HR 202bpm, her HS Trop only 16 on admission     Will plan to re-evaluate her EF in a few months     Initial plan was to resume her dilt, will change to coreg [place]    4. Volume OL     She got IV lasix on admission and yesterday     Lungs sound OK     Off O2 this AM O2 sat at rest was 93%     PO lasix ordered and will plan to continue going home     Plan for early follow up with BMET as well once discharged      Walk off O2 today had O2 sats 82-83%, not on home O2 typically, d/w dr. Lovena Le will give BID lasix today and keep her tonight  For questions or  updates, please contact [facility] Please consult [email or web address] for contact info under     Signed, Baldwin Jamaica, PA-C  12/10/2019, 9:26 AM    EP Attending  Patient seen and examined. I had hoped to get the patient home today but her oxygen desaturation has been problematic as has some diastolic CHF. She has been given IV lasix. I would anticipate discharge home tomorrow. She is maintaining NSR with PAC's and PVC's.  Mikle Bosworth.D

## 2019-12-10 NOTE — Progress Notes (Signed)
  Echocardiogram 2D Echocardiogram has been performed.  Lydia Myers 12/10/2019, 8:53 AM

## 2019-12-11 ENCOUNTER — Inpatient Hospital Stay (HOSPITAL_COMMUNITY): Payer: Medicare Other

## 2019-12-11 DIAGNOSIS — I471 Supraventricular tachycardia: Secondary | ICD-10-CM

## 2019-12-11 DIAGNOSIS — R9431 Abnormal electrocardiogram [ECG] [EKG]: Secondary | ICD-10-CM

## 2019-12-11 LAB — COMPREHENSIVE METABOLIC PANEL
ALT: 46 U/L — ABNORMAL HIGH (ref 0–44)
AST: 17 U/L (ref 15–41)
Albumin: 3.4 g/dL — ABNORMAL LOW (ref 3.5–5.0)
Alkaline Phosphatase: 71 U/L (ref 38–126)
Anion gap: 9 (ref 5–15)
BUN: 22 mg/dL (ref 8–23)
CO2: 25 mmol/L (ref 22–32)
Calcium: 8.5 mg/dL — ABNORMAL LOW (ref 8.9–10.3)
Chloride: 104 mmol/L (ref 98–111)
Creatinine, Ser: 1.47 mg/dL — ABNORMAL HIGH (ref 0.44–1.00)
GFR calc Af Amer: 39 mL/min — ABNORMAL LOW (ref >60)
GFR calc non Af Amer: 33 mL/min — ABNORMAL LOW (ref >60)
Glucose, Bld: 195 mg/dL — ABNORMAL HIGH (ref 70–99)
Potassium: 4.6 mmol/L (ref 3.5–5.1)
Sodium: 138 mmol/L (ref 135–145)
Total Bilirubin: 0.7 mg/dL (ref 0.3–1.2)
Total Protein: 5.9 g/dL — ABNORMAL LOW (ref 6.5–8.1)

## 2019-12-11 LAB — BASIC METABOLIC PANEL
Anion gap: 11 (ref 5–15)
BUN: 22 mg/dL (ref 8–23)
CO2: 25 mmol/L (ref 22–32)
Calcium: 9.1 mg/dL (ref 8.9–10.3)
Chloride: 103 mmol/L (ref 98–111)
Creatinine, Ser: 1.29 mg/dL — ABNORMAL HIGH (ref 0.44–1.00)
GFR calc Af Amer: 45 mL/min — ABNORMAL LOW (ref >60)
GFR calc non Af Amer: 39 mL/min — ABNORMAL LOW (ref >60)
Glucose, Bld: 166 mg/dL — ABNORMAL HIGH (ref 70–99)
Potassium: 4.3 mmol/L (ref 3.5–5.1)
Sodium: 139 mmol/L (ref 135–145)

## 2019-12-11 LAB — CBC
HCT: 39.9 % (ref 36.0–46.0)
Hemoglobin: 12.5 g/dL (ref 12.0–15.0)
MCH: 28.7 pg (ref 26.0–34.0)
MCHC: 31.3 g/dL (ref 30.0–36.0)
MCV: 91.7 fL (ref 80.0–100.0)
Platelets: 348 10*3/uL (ref 150–400)
RBC: 4.35 MIL/uL (ref 3.87–5.11)
RDW: 13.8 % (ref 11.5–15.5)
WBC: 9.3 10*3/uL (ref 4.0–10.5)
nRBC: 0 % (ref 0.0–0.2)

## 2019-12-11 LAB — ECHOCARDIOGRAM LIMITED
Height: 64 in
Weight: 2444.8 oz

## 2019-12-11 LAB — GLUCOSE, CAPILLARY
Glucose-Capillary: 177 mg/dL — ABNORMAL HIGH (ref 70–99)
Glucose-Capillary: 124 mg/dL — ABNORMAL HIGH (ref 70–99)

## 2019-12-11 MED ORDER — METFORMIN HCL 850 MG PO TABS: 850.0000 mg | ORAL_TABLET | Freq: Two times a day (BID) | ORAL | Status: DC

## 2019-12-11 MED ORDER — PHENYLEPHRINE HCL-NACL 10-0.9 MG/250ML-% IV SOLN
0.0000 ug/min | INTRAVENOUS | Status: DC
  Administered 2019-12-11: 18:00:00 30 ug/min via INTRAVENOUS
  Administered 2019-12-11: 20 ug/min via INTRAVENOUS
  Administered 2019-12-11: 10 ug/min via INTRAVENOUS
  Administered 2019-12-11: 23:00:00 30 ug/min via INTRAVENOUS
  Filled 2019-12-11 (×3): qty 250
  Filled 2019-12-11: qty 500

## 2019-12-11 MED ORDER — METFORMIN HCL 500 MG PO TABS
500.0000 mg | ORAL_TABLET | Freq: Two times a day (BID) | ORAL | Status: DC
  Administered 2019-12-11 – 2019-12-15 (×7): 500 mg via ORAL
  Filled 2019-12-11 (×7): qty 1

## 2019-12-11 MED ORDER — AMIODARONE HCL IN DEXTROSE 360-4.14 MG/200ML-% IV SOLN
INTRAVENOUS | Status: AC
  Administered 2019-12-11: 09:00:00 60 mg/h via INTRAVENOUS
  Filled 2019-12-11: qty 200

## 2019-12-11 MED ORDER — SODIUM CHLORIDE 0.9 % IV BOLUS
250.0000 mL | Freq: Once | INTRAVENOUS | Status: AC
  Administered 2019-12-11: 09:00:00 250 mL via INTRAVENOUS

## 2019-12-11 MED ORDER — AMIODARONE HCL IN DEXTROSE 360-4.14 MG/200ML-% IV SOLN: 30.0000 mg/h | INTRAVENOUS | Status: DC

## 2019-12-11 MED ORDER — AMIODARONE HCL IN DEXTROSE 360-4.14 MG/200ML-% IV SOLN
60.0000 mg/h | INTRAVENOUS | Status: AC
  Administered 2019-12-11: 14:00:00 60 mg/h via INTRAVENOUS

## 2019-12-11 MED ORDER — AMIODARONE HCL IN DEXTROSE 360-4.14 MG/200ML-% IV SOLN
60.0000 mg/h | INTRAVENOUS | Status: DC
  Administered 2019-12-11: 20:00:00 60 mg/h via INTRAVENOUS
  Administered 2019-12-12: 12:00:00 30 mg/h via INTRAVENOUS
  Administered 2019-12-12 – 2019-12-14 (×6): 60 mg/h via INTRAVENOUS
  Filled 2019-12-11 (×2): qty 400
  Filled 2019-12-11 (×6): qty 200

## 2019-12-11 MED ORDER — CHLORHEXIDINE GLUCONATE CLOTH 2 % EX PADS
6.0000 | MEDICATED_PAD | Freq: Every day | CUTANEOUS | Status: DC
  Administered 2019-12-11 – 2019-12-13 (×3): 6 via TOPICAL

## 2019-12-11 MED ORDER — SODIUM CHLORIDE 0.9 % IV BOLUS
1000.0000 mL | Freq: Once | INTRAVENOUS | Status: AC
  Administered 2019-12-11: 08:00:00 1000 mL via INTRAVENOUS

## 2019-12-11 MED ORDER — AMIODARONE HCL IN DEXTROSE 360-4.14 MG/200ML-% IV SOLN
INTRAVENOUS | Status: AC
  Administered 2019-12-11: 06:00:00 60 mg/h
  Filled 2019-12-11: qty 200

## 2019-12-11 MED ORDER — AMIODARONE IV BOLUS ONLY 150 MG/100ML
150.0000 mg | Freq: Once | INTRAVENOUS | Status: AC
  Administered 2019-12-11: 06:00:00 150 mg via INTRAVENOUS

## 2019-12-11 MED ORDER — FUROSEMIDE 40 MG PO TABS: 40.0000 mg | ORAL_TABLET | Freq: Every day | ORAL | Status: DC

## 2019-12-11 NOTE — Significant Event (Addendum)
Notified by RN of HR 180s and hypotension.   Arrived to bedside to find patient in narrow complex tachycardia with HR 180s.  SBP was in the 70s, thought patient awake, alert.  Amiodarone bolus 150 administered over 15 minutes with no effect.  250 cc NS bolus given with improvement in BP to SBP reading 110, and 130.  HR remained 180.   At that point, an additional 2.5mg  IV metoprolol (PRN medication) was given.  HR improved to 120s/130, but BP dropped to 60s/40s and patient became flushed and nauseous.  Additional fluid bolus given along with PRN zofran.   RRT RN at bedside, working on obtaining additional access in case of need for temporary vasopressors.  STAT echo ordered.   Discussed with Dr. Harrell Gave.   Sign out at bedside given to oncoming PA.   Karlis Cregg K. Marletta Lor, MD

## 2019-12-11 NOTE — Progress Notes (Signed)
  Echocardiogram 2D Echocardiogram has been performed.  Johny Chess 12/11/2019, 8:27 AM

## 2019-12-11 NOTE — Progress Notes (Addendum)
Progress Note  Patient Name: Lydia Myers Date of Encounter: 12/11/2019  Primary Cardiologist: Dr. [facility]  Subjective   She just arrived to ICU, " I don't know what the heck happened this morning", she denies any CP or SOB.  Wants to go home.  Inpatient Medications    Scheduled Meds:  atorvastatin  10 mg Oral q1800   brinzolamide  1 drop Both Eyes BID   carvedilol  6.25 mg Oral BID WC   Chlorhexidine Gluconate Cloth  6 each Topical Daily   furosemide  40 mg Oral BID   insulin aspart  0-15 Units Subcutaneous TID WC   insulin aspart  0-5 Units Subcutaneous QHS   latanoprost  1 drop Both Eyes QHS   levothyroxine  25 mcg Oral QAC breakfast   linagliptin  5 mg Oral QHS   loratadine  10 mg Oral Daily   potassium chloride  10 mEq Oral Daily   Rivaroxaban  15 mg Oral Q supper   sodium chloride flush  3 mL Intravenous Q12H   Continuous Infusions:  sodium chloride     phenylephrine (NEO-SYNEPHRINE) Adult infusion 20 mcg/min (12/11/19 0757)   PRN Meds: sodium chloride, acetaminophen, ALPRAZolam, metoprolol tartrate, nitroGLYCERIN, ondansetron (ZOFRAN) IV, sodium chloride flush, white petrolatum, zolpidem   Vital Signs    Vitals:   12/10/19 2104 12/11/19 0500 12/11/19 0705 12/11/19 0715  BP: 101/88 114/73 (!) 90/51 90/73  Pulse: 87 65  69  Resp: 20 20 (!) 23 (!) 24  Temp: 98.2 F (36.8 C)     TempSrc: Oral     SpO2: 97% 95% 98% 98%  Weight:      Height:        Intake/Output Summary (Last 24 hours) at 12/11/2019 0801 Last data filed at 12/10/2019 2300 Gross per 24 hour  Intake 243 ml  Output 1000 ml  Net -757 ml   Last 3 Weights 12/10/2019 12/09/2019 12/08/2019  Weight (lbs) 152 lb 12.8 oz 160 lb 12.8 oz 166 lb  Weight (kg) 69.31 kg 72.938 kg 75.297 kg      Telemetry    SR 80's-90's PACs at times are frequent - Personally Reviewed  ECG    SR, 95bpm, PACs - Personally Reviewed  Physical Exam   GEN: No acute distress.   Neck: No JVD Cardiac: RRR, no  murmurs, rubs, or gallops.  Respiratory: CTA b/l. GI: Soft, nontender, non-distended  MS: No edema; No deformity. Neuro:  Nonfocal  Psych: Normal affect   Labs    High Sensitivity Troponin:   Recent Labs  Lab 12/08/19 1827  TROPONINIHS 16      Chemistry Recent Labs  Lab 12/09/19 0420 12/10/19 0816 12/11/19 0336  NA 139 138 139  K 4.8 4.8 4.3  CL 102 105 103  CO2 23 24 25   GLUCOSE 163* 139* 166*  BUN 24* 22 22  CREATININE 1.29* 1.41* 1.29*  CALCIUM 9.7 8.9 9.1  PROT 6.6  --   --   ALBUMIN 4.1  --   --   AST 46*  --   --   ALT 92*  --   --   ALKPHOS 74  --   --   BILITOT 1.2  --   --   GFRNONAA 39* 35* 39*  GFRAA 45* 41* 45*  ANIONGAP 14 9 11      Hematology Recent Labs  Lab 12/08/19 1631  WBC 7.7  RBC 4.47  HGB 12.7  HCT 41.0  MCV 91.7  MCH 28.4  MCHC 31.0  RDW 14.3  PLT 382    BNPNo results for input(s): BNP, PROBNP in the last 168 hours.   DDimer No results for input(s): DDIMER in the last 168 hours.   Radiology     DG CHEST PORT 1 VIEW Result Date: 12/08/2019 CLINICAL DATA:  Shortness of breath. EXAM: PORTABLE CHEST 1 VIEW COMPARISON:  July 01, 2017. FINDINGS: Stable cardiomegaly. No pneumothorax or pleural effusion is noted. Both lungs are clear. The visualized skeletal structures are unremarkable. IMPRESSION: No active disease. Electronically Signed   By: Marijo Conception M.D.   On: 12/08/2019 18:59      Cardiac Studies   EP STUDY Result Date: 12/09/2019 CONCLUSIONS: 1. Isthmus-dependent right atrial flutter upon presentation. 2. Successful radiofrequency ablation of atrial flutter along the cavotricuspid isthmus with complete bidirectional isthmus block achieved. 3. No inducible arrhythmias following ablation. 4. No early apparent complications. Cristopher Peru, MD 4:53 PM 12/09/2019    12/10/2019: TTE IMPRESSIONS  1. Left ventricular ejection fraction, by visual estimation, is 30 to  35%. The left ventricle has moderate to severely decreased  function. There  is no left ventricular hypertrophy.   2. Mildly dilated left ventricular internal cavity size.   3. The left ventricle demonstrates regional wall motion abnormalities.   4. Septal apical and inferior wall hypokinesis. Focal area of mid  inferior wall akinesis with no obvious area of rupture.   5. Global right ventricle has normal systolic function.The right  ventricular size is normal. No increase in right ventricular wall  thickness.   6. Left atrial size was mildly dilated.   7. Right atrial size was normal.   8. Small pericardial effusion.   9. The pericardial effusion is posterior to the left ventricle and  lateral to the left ventricle.  10. Small to moderate nearly circumferential effusion IVC dilated no  diastolic RV collapse.  11. The mitral valve is normal in structure. Mild mitral valve  regurgitation. No evidence of mitral stenosis.  12. The tricuspid valve is normal in structure.  13. The tricuspid valve is normal in structure. Tricuspid valve  regurgitation is mild.  14. The aortic valve is tricuspid. Aortic valve regurgitation is not  visualized. Mild aortic valve sclerosis without stenosis.  15. Pulmonic regurgitation is mild.  16. The pulmonic valve was normal in structure. Pulmonic valve  regurgitation is mild.  17. Moderately elevated pulmonary artery systolic pressure.  18. The inferior vena cava is normal in size with greater than 50%  respiratory variability, suggesting right atrial pressure of 3 mmHg.     07/02/2017: TTE Study Conclusions - Left ventricle: The cavity size was normal. Wall thickness was    increased in a pattern of mild LVH. Systolic function was normal.    The estimated ejection fraction was in the range of 55% to 60%.    Wall motion was normal; there were no regional wall motion    abnormalities. Features are consistent with a pseudonormal left    ventricular filling pattern, with concomitant abnormal relaxation    and  increased filling pressure (grade 2 diastolic dysfunction).  - Ventricular septum: The contour showed diastolic flattening.  - Mitral valve: There was moderate regurgitation.  - Left atrium: The atrium was mildly dilated.  - Right ventricle: The cavity size was mildly dilated. Wall    thickness was normal.  - Tricuspid valve: There was trivial regurgitation.  - Pulmonary arteries: Systolic pressure was severely increased. PA  peak pressure: 81 mm Hg (S).   Patient Profile     79 y.o. female with a hx of COPD, DM, HTN, HLD, vertigo, hypothyroidism, AFlutter (ablated 2011, Dr. Caryl Comes) and subsequent Afib admitted with AFlutter w/RVR, conducting 1:1 at 202bpm  Assessment & Plan    1. AFlutter w/RVR 2. Afib by history as well     1:1 initially >> 2:1 with dilt gtt     CHA2DS2Vasc is 4, on xarelto, appropriately dosed for calCrCl 40's    She has been described as permanent AFib though her last couple of EKGs prior to her admission have been SR She underwent EPS/ablation 12/09/2019 with Dr. Lovena Le Discharge held for diuresis and O2 wean    This AM developed recurrent SVT rates 160's-180 treated with IV lopressor, and amio bolus, she had return of SR though hypotensive and started on metanephrines, transferred to ICU (suspect most likely 2/2 meds, she previously tolerated her tachycardia fairly well)  Dr. Lovena Le and I saw her in ICU, she was maintaining SR She denies any symptoms of CP or SOB She was unclear on "what the heck happened this AM" Echo is at bedside, no large pericardial effusion or signs of tamponade by Dr. Tanna Furry observation.  For now, no plans to ablate again today   3. New CM     LVEF 30-35% as well as some WMA noted     Given her marked RVR and a couple weeks of symptoms, suspect this is tachy mediated     She had no c/o CP and with HR 202bpm, her HS Trop only 16 on admission     Will plan to re-evaluate her EF in a few months     Will hold coreg this AM, wait  for wean from neo and clinical course today   4. Volume OL    By charting she is cumulatively fluid neg 2911ml, not weight this AM given events    Lungs sound clear    On 2L, 100%    Hold off on lasix for now this AM   She is not overtly volume OL by her exam     For questions or updates, please contact [facility] Please consult [email or web address] for contact info under     Signed, Baldwin Jamaica, PA-C  12/11/2019, 8:01 AM    EP Attending  Patient seen and examined. She has developed recurrent SVT of uncertain etiology. Her rates are different then in atrial flutter. I suspect atrial tachycardia though I cannot rule out atrial flutter at a slower cycle length. She has been started on IV amiodarone and I would recommend she be continued on this for at least 24 hours before transitioning to po. At this point, I would continue her on amiodarone. AV node ablation and PPM is another option, though hopefully we can avoid this.  Mikle Bosworth.D.

## 2019-12-11 NOTE — Progress Notes (Signed)
Chaplain engaged in initial visit with [name].  Upon request, chaplain and [name] prayed together.  [name] expressed a readiness to go home.  Chaplain offered listening ear and affirmed her want to find answers for her heart.    Chaplain will continue to follow-up.

## 2019-12-11 NOTE — Progress Notes (Signed)
Pt noted with HR sustaining in the 180s. EKG done  and PRN Metoprolol 2.5 mg per order.HR down 70s, SBP 70s-80s initially but went up to 114. HR up again 180s sustaining within 30 minutes. Dr Marletta Lor paged as well as RR. Dr Marletta Lor stopped by to see pt orders received for EKG, NS bolus250x 2 then 500 x .Amiiodarone.SBP still low Phenylephrine ordered and infusing. Order received to transfer pt to ICU. Report given to receiving nurse in [place]. Pt transferred to 2H15 by RR.

## 2019-12-11 NOTE — Significant Event (Addendum)
Rapid Response Event Note  Overview: Assisted transfer of Ms. Pineau to [id number]. Pt awake, alert, following commands. Pt remained on continuous telemetry monitoring and 2LNC during transfer. HR irregular 65-80 bpm. Pt on 56mcg Neo and SBP 90-106. Bedside hand-off given to receiving RN M. Basnight on 2H.   Casimer Bilis

## 2019-12-11 NOTE — Significant Event (Signed)
Rapid Response Event Note  Overview: HR 180s narrow complex  Initial Focused Assessment: Staff notified me regarding [name] and her HR 180s, BP 70s. [name] is awake, alert and making jokes. Dr. Marletta Lor at bedside. Pt currently on Amio gtt and received additional 150 mg amio bolus prior to my arrival. SBP 110s so Metoprolol 2.5 mg IV ordered and given. Pt denies CP and SOB. Pt was complaining of lower abdominal pain and nausea. She also became diaphoretic. SBP 70s and 80s after lopressor. Her HR changed to SR with PVCs in 60s and 70s. Additional NS bolus ordered and Neosynephrine gtt ordered and initiated. Neo gtt to 20 mcg/min and BP 90/51 and HR 77 SR. Bedside report given to [facility] and Kelli Churn RN for transfer to [facility].   Interventions: -Lopressor 2.5 mg IV -Additional PIV -750cc NS bolus (total since beginning of episode) -Neosynephrine gtt -tx to [id number]   Event Summary: Arrived at call 0600 Call ended 0745  Madelynn Done

## 2019-12-11 NOTE — Progress Notes (Signed)
HR back up to the 160s. B/P dropping requiring more Neo. Patient asymptomatic. Paged [place], [place]. Orders to give 259mL NS bolus and start Amiodarone drip without a bolus.

## 2019-12-12 ENCOUNTER — Other Ambulatory Visit: Payer: Self-pay

## 2019-12-12 ENCOUNTER — Encounter (HOSPITAL_COMMUNITY): Payer: Self-pay | Admitting: Cardiology

## 2019-12-12 DIAGNOSIS — I5189 Other ill-defined heart diseases: Secondary | ICD-10-CM

## 2019-12-12 DIAGNOSIS — N179 Acute kidney failure, unspecified: Secondary | ICD-10-CM

## 2019-12-12 DIAGNOSIS — N182 Chronic kidney disease, stage 2 (mild): Secondary | ICD-10-CM

## 2019-12-12 DIAGNOSIS — I519 Heart disease, unspecified: Secondary | ICD-10-CM

## 2019-12-12 LAB — GLUCOSE, CAPILLARY
Glucose-Capillary: 165 mg/dL — ABNORMAL HIGH (ref 70–99)
Glucose-Capillary: 153 mg/dL — ABNORMAL HIGH (ref 70–99)
Glucose-Capillary: 136 mg/dL — ABNORMAL HIGH (ref 70–99)
Glucose-Capillary: 164 mg/dL — ABNORMAL HIGH (ref 70–99)

## 2019-12-12 MED ORDER — AMIODARONE IV BOLUS ONLY 150 MG/100ML
150.0000 mg | INTRAVENOUS | Status: AC
  Administered 2019-12-12: 22:00:00 150 mg via INTRAVENOUS

## 2019-12-12 NOTE — Progress Notes (Signed)
Progress Note  Patient Name: Lydia Myers Date of Encounter: 12/12/2019  Primary Cardiologist: Dr. Rayann [facility]  Subjective   Currently feeling well.  No further tachycardia since yesterday afternoon.  Inpatient Medications    Scheduled Meds: . atorvastatin  10 mg Oral q1800  . brinzolamide  1 drop Both Eyes BID  . Chlorhexidine Gluconate Cloth  6 each Topical Daily  . insulin aspart  0-15 Units Subcutaneous TID WC  . insulin aspart  0-5 Units Subcutaneous QHS  . latanoprost  1 drop Both Eyes QHS  . levothyroxine  25 mcg Oral QAC breakfast  . linagliptin  5 mg Oral QHS  . loratadine  10 mg Oral Daily  . metFORMIN  500 mg Oral BID WC  . potassium chloride  10 mEq Oral Daily  . Rivaroxaban  15 mg Oral Q supper  . sodium chloride flush  3 mL Intravenous Q12H   Continuous Infusions: . sodium chloride 10 mL/hr at 12/12/19 0700  . amiodarone 30 mg/hr (12/12/19 0700)  . phenylephrine (NEO-SYNEPHRINE) Adult infusion Stopped (12/12/19 [id number])   PRN Meds: sodium chloride, acetaminophen, ALPRAZolam, nitroGLYCERIN, ondansetron (ZOFRAN) IV, sodium chloride flush, white petrolatum, zolpidem   Vital Signs    Vitals:   12/12/19 0600 12/12/19 0640 12/12/19 0700 12/12/19 0804  BP: 91/61  101/68   Pulse: (!) 48  66   Resp: 18  16   Temp:  (!) 97.3 F (36.3 C)  98.1 F (36.7 C)  TempSrc:  Oral  Oral  SpO2: 100%  99%   Weight:      Height:        Intake/Output Summary (Last 24 hours) at 12/12/2019 0840 Last data filed at 12/12/2019 0700 Gross per 24 hour  Intake 2255.42 ml  Output 100 ml  Net 2155.42 ml   Last 3 Weights 12/12/2019 12/10/2019 12/09/2019  Weight (lbs) 161 lb 9.6 oz 152 lb 12.8 oz 160 lb 12.8 oz  Weight (kg) 73.3 kg 69.31 kg 72.938 kg      Telemetry    Sinus rhythm with frequent PACs- Personally Reviewed  ECG    No new- Personally Reviewed  Physical Exam   GEN: Well nourished, well developed, in no acute distress  HEENT: normal  Neck: no JVD,  carotid bruits, or masses Cardiac: RRR; no murmurs, rubs, or gallops,no edema  Respiratory:  clear to auscultation bilaterally, normal work of breathing GI: soft, nontender, nondistended, + BS MS: no deformity or atrophy  Skin: warm and dry Neuro:  Strength and sensation are intact Psych: euthymic mood, full affect   Labs    High Sensitivity Troponin:   Recent Labs  Lab 12/08/19 1827  TROPONINIHS 16      Chemistry Recent Labs  Lab 12/09/19 0420 12/09/19 0420 12/10/19 0816 12/11/19 0336 12/11/19 0857  NA 139   < > 138 139 138  K 4.8   < > 4.8 4.3 4.6  CL 102   < > 105 103 104  CO2 23   < > 24 25 25   GLUCOSE 163*   < > 139* 166* 195*  BUN 24*   < > 22 22 22   CREATININE 1.29*   < > 1.41* 1.29* 1.47*  CALCIUM 9.7   < > 8.9 9.1 8.5*  PROT 6.6  --   --   --  5.9*  ALBUMIN 4.1  --   --   --  3.4*  AST 46*  --   --   --  17  ALT 92*  --   --   --  46*  ALKPHOS 74  --   --   --  71  BILITOT 1.2  --   --   --  0.7  GFRNONAA 39*   < > 35* 39* 33*  GFRAA 45*   < > 41* 45* 39*  ANIONGAP 14   < > 9 11 9    < > = values in this interval not displayed.     Hematology Recent Labs  Lab 12/08/19 1631 12/11/19 0857  WBC 7.7 9.3  RBC 4.47 4.35  HGB 12.7 12.5  HCT 41.0 39.9  MCV 91.7 91.7  MCH 28.4 28.7  MCHC 31.0 31.3  RDW 14.3 13.8  PLT 382 348    BNPNo results for input(s): BNP, PROBNP in the last 168 hours.   DDimer No results for input(s): DDIMER in the last 168 hours.   Radiology     DG CHEST PORT 1 VIEW Result Date: 12/08/2019 CLINICAL DATA:  Shortness of breath. EXAM: PORTABLE CHEST 1 VIEW COMPARISON:  July 01, 2017. FINDINGS: Stable cardiomegaly. No pneumothorax or pleural effusion is noted. Both lungs are clear. The visualized skeletal structures are unremarkable. IMPRESSION: No active disease. Electronically Signed   By: Marijo Conception M.D.   On: 12/08/2019 18:59      Cardiac Studies   EP STUDY Result Date: 12/09/2019 CONCLUSIONS: 1.  Isthmus-dependent right atrial flutter upon presentation. 2. Successful radiofrequency ablation of atrial flutter along the cavotricuspid isthmus with complete bidirectional isthmus block achieved. 3. No inducible arrhythmias following ablation. 4. No early apparent complications. Cristopher Peru, MD 4:53 PM 12/09/2019    12/10/2019: TTE IMPRESSIONS  1. Left ventricular ejection fraction, by visual estimation, is 30 to  35%. The left ventricle has moderate to severely decreased function. There  is no left ventricular hypertrophy.   2. Mildly dilated left ventricular internal cavity size.   3. The left ventricle demonstrates regional wall motion abnormalities.   4. Septal apical and inferior wall hypokinesis. Focal area of mid  inferior wall akinesis with no obvious area of rupture.   5. Global right ventricle has normal systolic function.The right  ventricular size is normal. No increase in right ventricular wall  thickness.   6. Left atrial size was mildly dilated.   7. Right atrial size was normal.   8. Small pericardial effusion.   9. The pericardial effusion is posterior to the left ventricle and  lateral to the left ventricle.  10. Small to moderate nearly circumferential effusion IVC dilated no  diastolic RV collapse.  11. The mitral valve is normal in structure. Mild mitral valve  regurgitation. No evidence of mitral stenosis.  12. The tricuspid valve is normal in structure.  13. The tricuspid valve is normal in structure. Tricuspid valve  regurgitation is mild.  14. The aortic valve is tricuspid. Aortic valve regurgitation is not  visualized. Mild aortic valve sclerosis without stenosis.  15. Pulmonic regurgitation is mild.  16. The pulmonic valve was normal in structure. Pulmonic valve  regurgitation is mild.  17. Moderately elevated pulmonary artery systolic pressure.  18. The inferior vena cava is normal in size with greater than 50%  respiratory variability, suggesting right  atrial pressure of 3 mmHg.     07/02/2017: TTE Study Conclusions - Left ventricle: The cavity size was normal. Wall thickness was    increased in a pattern of mild LVH. Systolic function was normal.    The estimated ejection fraction  was in the range of 55% to 60%.    Wall motion was normal; there were no regional wall motion    abnormalities. Features are consistent with a pseudonormal left    ventricular filling pattern, with concomitant abnormal relaxation    and increased filling pressure (grade 2 diastolic dysfunction).  - Ventricular septum: The contour showed diastolic flattening.  - Mitral valve: There was moderate regurgitation.  - Left atrium: The atrium was mildly dilated.  - Right ventricle: The cavity size was mildly dilated. Wall    thickness was normal.  - Tricuspid valve: There was trivial regurgitation.  - Pulmonary arteries: Systolic pressure was severely increased. PA    peak pressure: 81 mm Hg (S).   Patient Profile     79 y.o. female with a hx of COPD, DM, HTN, HLD, vertigo, hypothyroidism, AFlutter (ablated 2011, Dr. Caryl Comes) and subsequent Afib admitted with AFlutter w/RVR, conducting 1:1 at 202bpm  Assessment & Plan    1. AFlutter w/RVR 2. Afib by history as well  Currently on Xarelto.  CHA2DS2-VASc of 4.  Is now status post ablation for atrial flutter with recurrent arrhythmias, likely due to an atrial tachycardia.  Is on amiodarone IV.  We Datron Brakebill allow amiodarone drip to run out for this bag.  Once this is over, we Guerline Happ switch her to 400 mg twice daily for 2 weeks followed by 200 mg a day.  If she does not have any more episodes of tachycardia, she would be able to be discharged this afternoon.      3. New CM    Ejection fraction 30 to 35% with some regional wall motion abnormalities.  Given her marked ventricular response, this could be due to a tachycardia mediated cardiomyopathy.  She Zerek Litsey need a repeat echo as an outpatient.       For questions or  updates, please contact [place] Please consult [email or web address] for contact info under     Signed, Jaxxon Naeem Meredith Leeds, MD  12/12/2019, 8:40 AM

## 2019-12-12 NOTE — Progress Notes (Addendum)
Pt's HR increased to 170 bpm from 60-70s bpm. HR is sustained. Patient asymptomatic and reports she feels fine. Cards paged.  2127: Page returned, no new orders at this time.

## 2019-12-12 NOTE — Progress Notes (Signed)
eLink Physician-Brief Progress Note Patient Name: Greylyn Burtness Standish DOB: Aug 26, 1941 MRN: [id number]   Date of Service  12/12/2019  HPI/Events of Note  Called for narrow complex tachycardia (aflutter vs atach) in 160s. Patient is already being seen by cardiology. Is on amiodarone drip at 0.5 mg/min currently. Loaded with 150mg  IV amiodarone and drip started yesterday for this same problem. At that time, metoprolol 2.5mg  IV was also given due to inadequate amiodarone response and this improved the HR but BP dropped to 0000000 systolic. Patient currently asymptomatic and BP with systolic in [id number].   eICU Interventions  Given hypotension induced by metoprolol yesterday, somewhat reluctant to go down that path in this patient.  Will instead rebolus 150mg  amiodarone and increase drip rate back to 1 mg/min.  If this is ineffective, can consider digoxin loading.     Intervention Category Major Interventions: Arrhythmia - evaluation and management  Marily Lente Krystol Rocco 12/12/2019, 9:50 PM

## 2019-12-12 NOTE — Discharge Summary (Addendum)
Discharge Summary    Patient ID: Lydia Myers MRN: [id number]; DOB: 05-May-1941  Admit date: 12/08/2019 Discharge date: 12/15/2019  Primary Care Provider: Kelton Pillar, MD  Primary Electrophysiologist: Dr. Thompson Grayer, MD -> Dr. Lovena Le  Discharge Diagnoses    Active Problems:   ATRIAL FLUTTER   Atrial fibrillation with rapid ventricular response (HCC)   Systolic dysfunction, left ventricle   Acute renal failure superimposed on stage 2 chronic kidney disease ([place])  Diagnostic Studies/Procedures    A flutter ablation 12/09/19:  CONCLUSIONS:  1. Isthmus-dependent right atrial flutter upon presentation.  2. Successful radiofrequency ablation of atrial flutter along the cavotricuspid isthmus with complete bidirectional isthmus block achieved.  3. No inducible arrhythmias following ablation.  4. No early apparent complications.    Echocardiogram 12/10/2019:   1. Left ventricular ejection fraction, by visual estimation, is 30 to  35%. The left ventricle has moderate to severely decreased function. There  is no left ventricular hypertrophy.   2. Mildly dilated left ventricular internal cavity size.   3. The left ventricle demonstrates regional wall motion abnormalities.   4. Septal apical and inferior wall hypokinesis. Focal area of mid  inferior wall akinesis with no obvious area of rupture.   5. Global right ventricle has normal systolic function.The right  ventricular size is normal. No increase in right ventricular wall  thickness.   6. Left atrial size was mildly dilated.   7. Right atrial size was normal.   8. Small pericardial effusion.   9. The pericardial effusion is posterior to the left ventricle and  lateral to the left ventricle.  10. Small to moderate nearly circumferential effusion IVC dilated no  diastolic RV collapse.  11. The mitral valve is normal in structure. Mild mitral valve  regurgitation. No evidence of mitral stenosis.  12. The tricuspid valve is  normal in structure.  13. The tricuspid valve is normal in structure. Tricuspid valve  regurgitation is mild.  14. The aortic valve is tricuspid. Aortic valve regurgitation is not  visualized. Mild aortic valve sclerosis without stenosis.  15. Pulmonic regurgitation is mild.  16. The pulmonic valve was normal in structure. Pulmonic valve  regurgitation is mild.  17. Moderately elevated pulmonary artery systolic pressure.  18. The inferior vena cava is normal in size with greater than 50%  respiratory variability, suggesting right atrial pressure of 3 mmHg.   Limited echocardiogram 12/11/2019:  1. Left ventricular ejection fraction, by visual estimation, is 30 to  35%. The left ventricle has moderate to severely decreased function. There  is no increased left ventricular wall thickness.   2. Global right ventricle has moderately reduced systolic function.The  right ventricular size is normal.   3. The mitral valve is normal in structure. mild to moderate mitral valve  regurgitation.   4. Mild aortic valve sclerosis without stenosis.   5. The inferior vena cava is normal in size with greater than 50%  respiratory variability, suggesting right atrial pressure of 3 mmHg.   6. Small pericardial effusion. No evidence of tamponade   EPS/AVN ablation and PPM implantation 12/14/2019 1.  Implantation of a Medtronic dual chamber PPM on 12/14/2019 by Dr. Lovena Le.  The patient received a Medtronic model number [id number] PPM with model number [id number] right atrial lead and [id number] right ventricular lead. There were no immediate post procedure complications. 2.  CXR on 12/15/19  demonstrated no pneumothorax status post device implantation.   History of Present Illness     Lydia Myers is  a 79 y.o. female a hx of COPD, DM, HTN, HLD, vertigo, hypothyroidism, AFlutter (ablated 2011, Dr. Caryl Comes) and subsequent Afib who is being seen today for the evaluation of recurrent AFlutter at the request of Dr.  Harl Bowie.  Hospital Course     Lydia Myers is followed by the AFib clinic and Dr. Rayann Heman outpatient, primarily the Afib clinic of late. She had been offered re-do AFlutter ablation back in 2018 though declined and managed with amiodarone. In 11/2017 she developed AFib at which time her amiodarone was stopped. She had some chest pressure complaints however declined ischemic evaluation. Seen again in 07/2018 in rate controlled asymptomatic AFib with no plans for rhythm control.   She was noted to be doing well until recently with a couple weeks of feeling poorly, progressivly SOB and aware of fast HR. She was seen as a work in at the AFib clinic for EKG, noted in [place] w/RVR, 202bpm and sent to he ER. She was treated with IV diltiazem infusion as well as IV lopressor which slowed her rates with flutter underlying rhythm. She has since been maintained on dilt gtt. EP was asked to revisit for possible flutter ablation (if agreeable) given AFib rates have been controlled historically.   The patient was agreeable to plan and therefore was taken to the EP lab on 12/09/19 at which time a successful radiofrequency ablation of atrial flutter was completed.  There was no inducible arrhythmias following the ablation and no apparent complications.  Unfortunately on the morning of 12/11/2019 overnight cardiology fellow was notified of elevated heart rate and hypotension.  On fellow arrival, patient was in narrow complex tachycardia with a heart rate in the 180s and SBP's in the 70s. She was given an amiodarone bolus as well as IVF resuscitation. Additionally, she was given 2.5 mg IV metoprolol with HR improvement to the 120s.  BP remained low therefore she was started on a Neo-Synephrine infusion and transferred to 2H15.  BP improved with vasopressors.  A stat echocardiogram was ordered with no large pericardial effusion or signs of tamponade reviewed by Dr. Lovena Le.  She was stabilized throughout the day on 12/11/19. She  remained on IV amiodarone and vasopressors.   On 12/12/2019, the patient was seen by Dr. Curt Bears with plans to complete the full bag of amiodarone with plans to transition to po. Unfortunately, she continued to have poorly controlled AFL/atrial fib over the weekend, and decision was made on 2/8 to proceed with AV nodal ablation with implantation of a dual chamber PPM.   The patient underwent implantation of a Medtronic dual chamber PPM with details as outlined above.  She was monitored on telemetry overnight which demonstrated AP-VP in the 80s with occasional atrial ectopy. After device implantation, her AV node was successfully ablated with escape rhythm in the 50s. Pt also went into NSR during the case. Left chest discharge day was without hematoma or ecchymosis.  The device was interrogated and found to be functioning normally.  CXR was obtained and demonstrated no pneumothorax status post device implantation.  Wound care, arm mobility, and restrictions were reviewed with the patient.  The patient was examined and considered stable for discharge to home by Dr. Lovena Le.  Diltiazem stopped with newly low EF. Started on low dose Toprol. Ramipril stopped, as she should not be on an ACE and an ARB together. Continue Olmesartan.   Regarding blood thinner therapy, they were instructed to resume their Xarelto on Thursday at dinner (12/17/2019)   Cr remains slightly  elevated but stable on discharge, K high.   Will decrease potassium to ONLY on days that she takes lasix.  Will check BMET next week.   Hospital problems include:  Atrial flutter/atrial fibrillation with RVR: Currently anticoagulated with Xarelto for a CHA2DS2VASc score of 4. She is s/p atrial flutter ablation with recurrent arrhythmias likely due to atrial tachycardia.  She had recurrence despite the ablation with uncontrolled rates despite medical therapy. AVN ablation performed 12/14/2019 with PPM implantation.   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  Given her marked ventricular response, this could be due to tachycardia mediated cardiomyopathy.  Will need repeat echocardiogram in the outpatient setting. Currently on ACE and ARB therapies in the OP setting. Will stop low dose ACE and continue ARB. May need possible ischemic evaluation if no improvement on repeat echo if patient agreeable. LBBB pacing pursued with PPM with QRS of 128 ms on day of discharge. Meds adjusted.  DM2: Resume home medications and follow with PCP Metformin held with AKI. Further pending BMET next week and PCP follow up  Acute on chronic kidney disease: Creatinine, 1.47 on 2/5/21with an admission creatinine of 1.53. Remote baseline appears to be in the 1.0 in 2018. She will eventually need to be placed on GDMT for cardiomyopathy however will attempt this in the OP setting given hypotension and renal dysfunction during hospital course. Pt noted to be on ACE and ARB therapies. Will stop ACE for now and follow closely in the OP setting     Consultants: None  _____________  Discharge Vitals Blood pressure (!) 146/67, pulse 80, temperature 99.9 F (37.7 C), temperature source Oral, resp. rate 17, height 5\' 4"  (1.626 m), weight 70.9 kg, SpO2 95 %.  Filed Weights   12/12/19 0500 12/13/19 0500 12/14/19 0500  Weight: 73.3 kg 73.5 kg 70.9 kg    Labs & Radiologic Studies    CBC Recent Labs    12/13/19 0133  HGB 13.6  HCT [id number]   Basic Metabolic Panel Recent Labs    12/13/19 0122 12/13/19 0122 12/13/19 0133 12/15/19 1022  NA 134*   < > 134* 135  K 4.8   < > 4.6 5.3*  CL 100  --   --  100  CO2 20*  --   --  19*  GLUCOSE 202*  --   --  213*  BUN 28*  --   --  27*  CREATININE 1.78*  --   --  1.79*  CALCIUM 8.9  --   --  9.2  MG 1.8  --   --   --    < > = values in this interval not displayed.   Liver Function Tests Recent Labs     12/13/19 0122  AST 12*  ALT 33  ALKPHOS 73  BILITOT 0.5  PROT 5.9*  ALBUMIN 3.4*   No results for input(s): LIPASE, AMYLASE in the last 72 hours. High Sensitivity Troponin:   Recent Labs  Lab 12/08/19 1827  TROPONINIHS 16    BNP Invalid input(s): POCBNP D-Dimer No results for input(s): DDIMER in the last 72 hours. Hemoglobin A1C No results for input(s): HGBA1C in the last 72 hours. Fasting Lipid Panel No results for input(s): CHOL, HDL, LDLCALC, TRIG, CHOLHDL, LDLDIRECT in the last 72 hours. Thyroid Function Tests No results for input(s): TSH, T4TOTAL, T3FREE, THYROIDAB in the last 72 hours.  Invalid input(s): FREET3 _____________  DG Chest 2 View  Result Date: 12/15/2019 CLINICAL DATA:  Pacemaker placement. EXAM: CHEST - 2 VIEW COMPARISON:  12/11/2019 FINDINGS: Left-sided dual chamber cardiac pacemaker has been placed. Leads in the expected region of the right atrial appendage and right ventricle. Heart remains large for size. Negative for pneumothorax. Slightly increased densities at the left lung base are most compatible with atelectasis. Difficult to exclude small pleural effusions. Atherosclerotic calcifications at the aortic arch. IMPRESSION: 1. Placement of cardiac pacemaker.  Negative for pneumothorax. 2. Left basilar atelectasis.  Question small pleural effusions. Electronically Signed   By: Markus Daft M.D.   On: 12/15/2019 09:18   EP STUDY  Result Date: 12/14/2019 CONCLUSIONS:  1. Successful implantation of a medtronic dual-chamber pacemaker and AV node ablation due to uncontrolled atrial fib/flutter/tachycardia.  2. No early apparent complications.       Cristopher Peru, MD 12/14/2019 8:31 PM   EP STUDY  Result Date: 12/09/2019 CONCLUSIONS: 1. Isthmus-dependent right atrial flutter upon presentation. 2. Successful radiofrequency ablation of atrial flutter along the cavotricuspid isthmus with complete bidirectional isthmus block achieved. 3. No inducible arrhythmias  following ablation. 4. No early apparent complications. Cristopher Peru, MD 4:53 PM 12/09/2019   EP PPM/ICD IMPLANT  Result Date: 12/14/2019 CONCLUSIONS:  1. Successful implantation of a medtronic dual-chamber pacemaker and AV node ablation due to uncontrolled atrial fib/flutter/tachycardia.  2. No early apparent complications.       Cristopher Peru, MD 12/14/2019 8:31 PM   DG CHEST PORT 1 VIEW  Result Date: 12/11/2019 CLINICAL DATA:  CHF. EXAM: PORTABLE CHEST 1 VIEW COMPARISON:  12/08/2019. FINDINGS: Cardiomegaly with mild pulmonary venous congestion. No focal infiltrate. No pleural effusion or pneumothorax. No acute bony abnormality identified. IMPRESSION: Cardiomegaly with mild pulmonary venous congestion. No focal pulmonary infiltrate. Electronically Signed   By: Marcello Moores  Register   On: 12/11/2019 09:22   DG CHEST PORT 1 VIEW  Result Date: 12/08/2019 CLINICAL DATA:  Shortness of breath. EXAM: PORTABLE CHEST 1 VIEW COMPARISON:  July 01, 2017. FINDINGS: Stable cardiomegaly. No pneumothorax or pleural effusion is noted. Both lungs are clear. The visualized skeletal structures are unremarkable. IMPRESSION: No active disease. Electronically Signed   By: Marijo Conception M.D.   On: 12/08/2019 18:59   ECHOCARDIOGRAM COMPLETE  Result Date: 12/10/2019   ECHOCARDIOGRAM REPORT   Patient Name:   Lydia Myers Date of Exam: 12/10/2019 Medical Rec #:  [id number]       Height:       64.0 in Accession #:    [id number]      Weight:       152.8 lb Date of Birth:  07/12/1941       BSA:          1.74 m Patient Age:    42 years        BP:           130/84 mmHg Patient Gender: F               HR:           91 bpm. Exam Location:  Inpatient Procedure: 2D Echo Indications:    atrial flutter 427.32  History:        Patient has prior history of Echocardiogram examinations, most                 recent 07/02/2017. Arrythmias:Atrial Fibrillation and Atrial                 Flutter; Risk  Factors:Hypertension and Dyslipidemia.  Sonographer:     Johny Chess Referring Phys: [place]  1. Left ventricular ejection fraction, by visual estimation, is 30 to 35%. The left ventricle has moderate to severely decreased function. There is no left ventricular hypertrophy.  2. Mildly dilated left ventricular internal cavity size.  3. The left ventricle demonstrates regional wall motion abnormalities.  4. Septal apical and inferior wall hypokinesis. Focal area of mid inferior wall akinesis with no obvious area of rupture.  5. Global right ventricle has normal systolic function.The right ventricular size is normal. No increase in right ventricular wall thickness.  6. Left atrial size was mildly dilated.  7. Right atrial size was normal.  8. Small pericardial effusion.  9. The pericardial effusion is posterior to the left ventricle and lateral to the left ventricle. 10. Small to moderate nearly circumferential effusion IVC dilated no diastolic RV collapse. 11. The mitral valve is normal in structure. Mild mitral valve regurgitation. No evidence of mitral stenosis. 12. The tricuspid valve is normal in structure. 13. The tricuspid valve is normal in structure. Tricuspid valve regurgitation is mild. 14. The aortic valve is tricuspid. Aortic valve regurgitation is not visualized. Mild aortic valve sclerosis without stenosis. 15. Pulmonic regurgitation is mild. 16. The pulmonic valve was normal in structure. Pulmonic valve regurgitation is mild. 17. Moderately elevated pulmonary artery systolic pressure. 18. The inferior vena cava is normal in size with greater than 50% respiratory variability, suggesting right atrial pressure of 3 mmHg. FINDINGS  Left Ventricle: Left ventricular ejection fraction, by visual estimation, is 30 to 35%. The left ventricle has moderate to severely decreased function. The left ventricle demonstrates regional wall motion abnormalities. The left ventricular internal cavity size was mildly dilated left ventricle. There is  no left ventricular hypertrophy. Normal left atrial pressure. Septal apical and inferior wall hypokinesis. Focal area of mid inferior wall akinesis with no obvious area of rupture. Right Ventricle: The right ventricular size is normal. No increase in right ventricular wall thickness. Global RV systolic function is has normal systolic function. The tricuspid regurgitant velocity is 3.53 m/s, and with an assumed right atrial pressure  of 3 mmHg, the estimated right ventricular systolic pressure is moderately elevated at 52.7 mmHg. Left Atrium: Left atrial size was mildly dilated. Right Atrium: Right atrial size was normal in size Pericardium: A small pericardial effusion is present. The pericardial effusion is posterior to the left ventricle and lateral to the left ventricle. Small to moderate nearly circumferential effusion IVC dilated no diastolic RV collapse. Mitral Valve: The mitral valve is normal in structure. There is mild thickening of the mitral valve leaflet(s). Mild mitral valve regurgitation. No evidence of mitral valve stenosis by observation. Tricuspid Valve: The tricuspid valve is normal in structure. Tricuspid valve regurgitation is mild. Aortic Valve: The aortic valve is tricuspid. Aortic valve regurgitation is not visualized. Mild aortic valve sclerosis is present, with no evidence of aortic valve stenosis. Pulmonic Valve: The pulmonic valve was normal in structure. Pulmonic valve regurgitation is mild. Pulmonic regurgitation is mild. Aorta: The aortic root, ascending aorta and aortic arch are all structurally normal, with no evidence of dilitation or obstruction. Venous: The inferior vena cava is normal in size with greater than 50% respiratory variability, suggesting right atrial pressure of 3 mmHg. IAS/Shunts: No atrial level shunt detected by color flow Doppler. There is no evidence of a patent foramen ovale. No ventricular septal defect is seen or detected. There is no evidence of  an atrial  septal defect.  LEFT VENTRICLE PLAX 2D LVIDd:         4.60 cm LVIDs:         3.70 cm LV PW:         1.10 cm LV IVS:        0.90 cm LVOT diam:     2.20 cm LV SV:         39 ml LV SV Index:   21.98 LVOT Area:     3.80 cm  LEFT ATRIUM             Index       RIGHT ATRIUM           Index LA diam:        3.70 cm 2.12 cm/m  RA Area:     17.50 cm LA Vol (A2C):   66.9 ml 38.34 ml/m RA Volume:   42.30 ml  24.24 ml/m LA Vol (A4C):   79.6 ml 45.62 ml/m LA Biplane Vol: 81.1 ml 46.48 ml/m  AORTIC VALVE LVOT Vmax:   68.50 cm/s LVOT Vmean:  42.600 cm/s LVOT VTI:    0.129 m  AORTA Ao Root diam: 3.10 cm MITRAL VALVE                        TRICUSPID VALVE MV Area (PHT): 3.53 cm             TR Peak grad:   49.7 mmHg MV PHT:        62.35 msec           TR Vmax:        368.00 cm/s MV Decel Time: 215 msec MV E velocity: 88.70 cm/s 103 cm/s  SHUNTS MV A velocity: 72.80 cm/s 70.3 cm/s Systemic VTI:  0.13 m MV E/A ratio:  1.22       1.5       Systemic Diam: 2.20 cm  Jenkins Rouge MD Electronically signed by Jenkins Rouge MD Signature Date/Time: 12/10/2019/8:56:55 AM    Final    ECHOCARDIOGRAM LIMITED  Result Date: 12/11/2019   ECHOCARDIOGRAM LIMITED REPORT   Patient Name:   Lydia Myers Date of Exam: 12/11/2019 Medical Rec #:  [id number]       Height:       64.0 in Accession #:    [id number]      Weight:       152.8 lb Date of Birth:  28-Mar-1941       BSA:          1.74 m Patient Age:    23 years        BP:           90/73 mmHg Patient Gender: F               HR:           83 bpm. Exam Location:  Inpatient  Procedure: 2D Echo STAT ECHO Indications:    abnormal ecg 794.31  History:        Patient has prior history of Echocardiogram examinations, most                 recent 12/10/2019.  Sonographer:    Johny Chess Referring Phys: [id number] [place]  1. Left ventricular ejection fraction, by visual estimation, is 30 to 35%. The left ventricle has moderate to severely decreased function. There is no increased left  ventricular wall  thickness.  2. Global right ventricle has moderately reduced systolic function.The right ventricular size is normal.  3. The mitral valve is normal in structure. mild to moderate mitral valve regurgitation.  4. Mild aortic valve sclerosis without stenosis.  5. The inferior vena cava is normal in size with greater than 50% respiratory variability, suggesting right atrial pressure of 3 mmHg.  6. Small pericardial effusion. No evidence of tamponade FINDINGS  Left Ventricle: Left ventricular ejection fraction, by visual estimation, is 30 to 35%. The left ventricle has moderate to severely decreased function. There is no increased left ventricular wall thickness. Right Ventricle: The right ventricular size is normal. Global RV systolic function is has moderately reduced systolic function. Left Atrium: Left atrial size was mildly dilated. Right Atrium: Right atrial size was normal in size. Right atrial pressure is estimated at 3 mmHg. Pericardium: A small pericardial effusion is present is seen. A small pericardial effusion is present. Mitral Valve: The mitral valve is normal in structure. There is mild thickening of the mitral valve leaflet(s). MV Area by PHT, 3.60 cm. MV PHT, 61.19 msec. Mild to moderate mitral valve regurgitation. Tricuspid Valve: The tricuspid valve is normal in structure. Tricuspid valve regurgitation is trivial. Aortic Valve: The aortic valve is tricuspid. Aortic valve regurgitation is not visualized. Mild aortic valve sclerosis is present, with no evidence of aortic valve stenosis. Pulmonic Valve: The pulmonic valve was grossly normal. Pulmonic valve regurgitation is not visualized by color flow Doppler. Pulmonic regurgitation is not visualized by color flow Doppler. Aorta: The aortic root is normal in size and structure. Venous: The inferior vena cava is normal in size with greater than 50% respiratory variability, suggesting right atrial pressure of 3 mmHg.  LEFT VENTRICLE           Normals PLAX 2D LVIDd:         4.70 cm  3.6 cm LVIDs:         3.80 cm  1.7 cm LV PW:         1.00 cm  1.4 cm LV IVS:        0.80 cm  1.3 cm LVOT diam:     2.20 cm  2.0 cm LV SV:         40 ml    79 ml LV SV Index:   22.65    45 ml/m2 LVOT Area:     3.80 cm 3.14 cm2  LEFT ATRIUM         Index LA diam:    3.80 cm 2.18 cm/m   AORTA                 Normals Ao Root diam: 3.00 cm 31 mm MITRAL VALVE              Normals MV Area (PHT): 3.60 cm             SHUNTS MV PHT:        61.19 msec 55 ms     Systemic Diam: 2.20 cm MV Decel Time: 211 msec   187 ms MV E velocity: 76.30 cm/s 103 cm/s MV A velocity: 67.70 cm/s 70.3 cm/s MV E/A ratio:  1.13       1.5  Oswaldo Milian MD Electronically signed by Oswaldo Milian MD Signature Date/Time: 12/11/2019/8:52:45 AMThe mitral valve is normal in structure.    Final    Disposition   Pt is being discharged home today in good condition.  Follow-up Plans & Appointments  Follow-up Information     Evans Lance, MD Follow up on 03/16/2020.   Specialty: Cardiology Why: at 230 pm for 3 month pacer check Contact information: [address] [place] [address] [phone]         [facility] Follow up on 12/24/2019.   Why: at 1030 for post pacemaker wound check Contact information: [place] [phone]        Shirley Friar, PA-C Follow up on 01/18/2020.   Specialty: Physician Assistant Why: at [phone] for pacemaker follow up.  Contact information: [address] [address] [phone]         Kelton Pillar, MD. Schedule an appointment as soon as possible for a visit.   Specialty: Family Medicine Contact information: [address] [place] [address] [phone]             Discharge Medications   Allergies as of 12/15/2019       Reactions   Shrimp  [shellfish Allergy] Nausea And Vomiting        Medication List     STOP taking these medications    diltiazem 240 MG 24 hr capsule Commonly known as: CARDIZEM CD   metFORMIN 850 MG tablet Commonly known as: GLUCOPHAGE   ramipril 5 MG capsule Commonly known as: ALTACE       TAKE these medications    acetaminophen 325 MG tablet Commonly known as: TYLENOL Take 1-2 tablets (325-650 mg total) by mouth every 4 (four) hours as needed for mild pain.   amiodarone 200 MG tablet Commonly known as: PACERONE Take 1 tablet (200 mg total) by mouth 2 (two) times daily.   atorvastatin 10 MG tablet Commonly known as: LIPITOR TAKE 1 TABLET BY MOUTH EVERY DAY AT 6PM What changed: See the new instructions.   Azopt 1 % ophthalmic suspension Generic drug: brinzolamide Place 1 drop into both eyes 2 (two) times daily.   clotrimazole-betamethasone cream Commonly known as: LOTRISONE Apply 1 application topically daily as needed for irritation. Apply 1 application every day as needed for vaginal irritation   furosemide 20 MG tablet Commonly known as: LASIX TAKE 1 TABLET BY MOUTH EVERY 3 DAYS What changed: See the new instructions.   Januvia 100 MG tablet Generic drug: sitaGLIPtin Take 100 mg by mouth daily.   levothyroxine 25 MCG tablet Commonly known as: SYNTHROID Take 1 tablet (25 mcg total) by mouth daily before breakfast.   metoprolol succinate 25 MG 24 hr tablet Commonly known as: Toprol XL Take 1 tablet (25 mg total) by mouth daily.   olmesartan 40 MG tablet Commonly known as: BENICAR Take 40 mg by mouth daily.   potassium chloride 10 MEQ tablet Commonly known as: Klor-Con M10 Take 1 tablet (10 meq) only on days that you take lasix. What changed:  how much to take how to take this when to take this additional instructions   Rivaroxaban 15 MG Tabs tablet Commonly known as: Xarelto TAKE 1 TABLET BY MOUTH EVERY DAY WITH DINNER. RESUME Thursday, 2/11 Start taking  on: December 17, 2019 What changed:  additional instructions These instructions start on December 17, 2019. If you are unsure what to do until then, ask your doctor or other care provider.   Travatan Z 0.004 % Soln ophthalmic solution Generic drug: Travoprost (BAK Free) Place 1 drop into both eyes every evening.   white petrolatum Gel  Commonly known as: VASELINE Apply 1 application topically as needed for lip care.           Outstanding Labs/Studies     Duration of Discharge Encounter   Greater than 30 minutes including physician time.  Signed, Shirley Friar, PA-C 12/15/2019, 11:52 AM  EP Attending  Patient seen and examined. Agree with the findings as noted above. The patient has undergone AV node ablation after undergoing PPM insertion with Left bundle pacing. She is stable for DC home with the usual followup.   Mikle Bosworth.D

## 2019-12-13 LAB — COMPREHENSIVE METABOLIC PANEL
ALT: 33 U/L (ref 0–44)
AST: 12 U/L — ABNORMAL LOW (ref 15–41)
Albumin: 3.4 g/dL — ABNORMAL LOW (ref 3.5–5.0)
Alkaline Phosphatase: 73 U/L (ref 38–126)
Anion gap: 14 (ref 5–15)
BUN: 28 mg/dL — ABNORMAL HIGH (ref 8–23)
CO2: 20 mmol/L — ABNORMAL LOW (ref 22–32)
Calcium: 8.9 mg/dL (ref 8.9–10.3)
Chloride: 100 mmol/L (ref 98–111)
Creatinine, Ser: 1.78 mg/dL — ABNORMAL HIGH (ref 0.44–1.00)
GFR calc Af Amer: 31 mL/min — ABNORMAL LOW (ref >60)
GFR calc non Af Amer: 26 mL/min — ABNORMAL LOW (ref >60)
Glucose, Bld: 202 mg/dL — ABNORMAL HIGH (ref 70–99)
Potassium: 4.8 mmol/L (ref 3.5–5.1)
Sodium: 134 mmol/L — ABNORMAL LOW (ref 135–145)
Total Bilirubin: 0.5 mg/dL (ref 0.3–1.2)
Total Protein: 5.9 g/dL — ABNORMAL LOW (ref 6.5–8.1)

## 2019-12-13 LAB — POCT I-STAT EG7
Acid-base deficit: 4 mmol/L — ABNORMAL HIGH (ref 0.0–2.0)
Bicarbonate: 22.1 mmol/L (ref 20.0–28.0)
Calcium, Ion: 1.19 mmol/L (ref 1.15–1.40)
HCT: 40 % (ref 36.0–46.0)
Hemoglobin: 13.6 g/dL (ref 12.0–15.0)
O2 Saturation: 88 %
Potassium: 4.6 mmol/L (ref 3.5–5.1)
Sodium: 134 mmol/L — ABNORMAL LOW (ref 135–145)
TCO2: 23 mmol/L (ref 22–32)
pCO2, Ven: 41.5 mmHg — ABNORMAL LOW (ref 44.0–60.0)
pH, Ven: 7.335 (ref 7.250–7.430)
pO2, Ven: 58 mmHg — ABNORMAL HIGH (ref 32.0–45.0)

## 2019-12-13 LAB — MAGNESIUM: Magnesium: 1.8 mg/dL (ref 1.7–2.4)

## 2019-12-13 LAB — GLUCOSE, CAPILLARY
Glucose-Capillary: 156 mg/dL — ABNORMAL HIGH (ref 70–99)
Glucose-Capillary: 254 mg/dL — ABNORMAL HIGH (ref 70–99)
Glucose-Capillary: 68 mg/dL — ABNORMAL LOW (ref 70–99)
Glucose-Capillary: 114 mg/dL — ABNORMAL HIGH (ref 70–99)
Glucose-Capillary: 106 mg/dL — ABNORMAL HIGH (ref 70–99)

## 2019-12-13 LAB — LACTIC ACID, PLASMA
Lactic Acid, Venous: 1.1 mmol/L (ref 0.5–1.9)
Lactic Acid, Venous: 1 mmol/L (ref 0.5–1.9)

## 2019-12-13 MED ORDER — MAGNESIUM SULFATE 2 GM/50ML IV SOLN: 2.0000 g | Freq: Once | INTRAVENOUS | Status: DC

## 2019-12-13 MED ORDER — MAGNESIUM OXIDE 400 (241.3 MG) MG PO TABS
400.0000 mg | ORAL_TABLET | Freq: Once | ORAL | Status: AC
  Administered 2019-12-13: 13:00:00 400 mg via ORAL
  Filled 2019-12-13: qty 1

## 2019-12-13 MED ORDER — ADENOSINE 6 MG/2ML IV SOLN
INTRAVENOUS | Status: AC
  Filled 2019-12-13: qty 2

## 2019-12-13 MED ORDER — ADENOSINE 6 MG/2ML IV SOLN
6.0000 mg | INTRAVENOUS | Status: DC | PRN
  Filled 2019-12-13: qty 2

## 2019-12-13 MED ORDER — MAGNESIUM HYDROXIDE 400 MG/5ML PO SUSP: 30.0000 mL | Freq: Every day | ORAL | Status: DC | PRN

## 2019-12-13 MED ORDER — NOREPINEPHRINE 4 MG/250ML-% IV SOLN
INTRAVENOUS | Status: AC
  Filled 2019-12-13: qty 250

## 2019-12-13 MED ORDER — DIGOXIN 0.25 MG/ML IJ SOLN
0.1250 mg | INTRAMUSCULAR | Status: AC
  Administered 2019-12-13: 0.125 mg via INTRAVENOUS
  Filled 2019-12-13: qty 2

## 2019-12-13 NOTE — Progress Notes (Signed)
This evening, Ms. Trinna Balloon developed narrow complex tachycardia, regular, with rate 160. Blood pressure had been stable, but she does feel anxious and generally weak. She received amiodarone bolus and drip at rate 1 mg / min. Labs were checked and unchanged, with normal K, Mg, and Ca. SVT persisted. Out of concern for worsening myocardial stress with persistent tachycardia, we pushed adenosine 6 mg at 02:00 with transient slowing but no AV block. We then pushed adenosine 12 mg IV which resulted in transient halving of the ventricular rate with visible atrial activity between QRS complexes in a roughly 2:1 ratio, most consistent with atrial tachycardia. SVT resumed immediately after adenosine wore off. We considered cardioversion but elected not to do so overnight due to the possibility of missed doses of rivaroxaban. I gave IV digoxin 125 mcg and will assess response. If this achieves improved rate control, will prescribe standing dosing. NPO at midnight for consideration of TEE / CV in the morning.   Osvaldo Shipper, MD

## 2019-12-13 NOTE — Progress Notes (Signed)
Progress Note  Patient Name: Lydia Myers Date of Encounter: 12/13/2019  Primary Cardiologist: Dr. Rayann Heman AF[facility] clinic  Subjective   Unfortunately continues to have episodes of tachycardia despite amiodarone infusion.  Currently in atrial tachycardia with variable block.  Heart rates were in the 160s during the day yesterday, but have fortunately come down into the 110 to 120s.  Inpatient Medications    Scheduled Meds: . atorvastatin  10 mg Oral q1800  . brinzolamide  1 drop Both Eyes BID  . Chlorhexidine Gluconate Cloth  6 each Topical Daily  . insulin aspart  0-15 Units Subcutaneous TID WC  . insulin aspart  0-5 Units Subcutaneous QHS  . latanoprost  1 drop Both Eyes QHS  . levothyroxine  25 mcg Oral QAC breakfast  . linagliptin  5 mg Oral QHS  . loratadine  10 mg Oral Daily  . metFORMIN  500 mg Oral BID WC  . potassium chloride  10 mEq Oral Daily  . Rivaroxaban  15 mg Oral Q supper  . sodium chloride flush  3 mL Intravenous Q12H   Continuous Infusions: . sodium chloride Stopped (12/13/19 0214)  . amiodarone 60 mg/hr (12/13/19 0700)  . norepinephrine    . phenylephrine (NEO-SYNEPHRINE) Adult infusion Stopped (12/12/19 [id number])   PRN Meds: sodium chloride, acetaminophen, adenosine (ADENOCARD) IV, ALPRAZolam, magnesium hydroxide, nitroGLYCERIN, ondansetron (ZOFRAN) IV, sodium chloride flush, white petrolatum, zolpidem   Vital Signs    Vitals:   12/13/19 0736 12/13/19 0737 12/13/19 0800 12/13/19 0900  BP:   (!) 106/59 105/65  Pulse: 88 78 76 64  Resp: (!) 23 (!) 25 (!) 21 (!) 22  Temp:      TempSrc:      SpO2: 99% 98% 97% 100%  Weight:      Height:        Intake/Output Summary (Last 24 hours) at 12/13/2019 0916 Last data filed at 12/13/2019 0700 Gross per 24 hour  Intake 830.54 ml  Output 850 ml  Net -19.46 ml   Last 3 Weights 12/13/2019 12/12/2019 12/10/2019  Weight (lbs) 162 lb 0.6 oz 161 lb 9.6 oz 152 lb 12.8 oz  Weight (kg) 73.5 kg 73.3 kg 69.31 kg       Telemetry    Atrial tachycardia-personally reviewed  ECG    SVT-personally reviewed  Physical Exam   GEN: Well nourished, well developed, in no acute distress  HEENT: normal  Neck: no JVD, carotid bruits, or masses Cardiac: Tachycardic, irregular; no murmurs, rubs, or gallops,no edema  Respiratory:  clear to auscultation bilaterally, normal work of breathing GI: soft, nontender, nondistended, + BS MS: no deformity or atrophy  Skin: warm and dry Neuro:  Strength and sensation are intact Psych: euthymic mood, full affect    Labs    High Sensitivity Troponin:   Recent Labs  Lab 12/08/19 1827  TROPONINIHS 16      Chemistry Recent Labs  Lab 12/09/19 0420 12/10/19 0816 12/11/19 0336 12/11/19 0336 12/11/19 0857 12/13/19 0122 12/13/19 0133  NA 139   < > 139   < > 138 134* 134*  K 4.8   < > 4.3   < > 4.6 4.8 4.6  CL 102   < > 103  --  104 100  --   CO2 23   < > 25  --  25 20*  --   GLUCOSE 163*   < > 166*  --  195* 202*  --   BUN 24*   < >  22  --  22 28*  --   CREATININE 1.29*   < > 1.29*  --  1.47* 1.78*  --   CALCIUM 9.7   < > 9.1  --  8.5* 8.9  --   PROT 6.6  --   --   --  5.9* 5.9*  --   ALBUMIN 4.1  --   --   --  3.4* 3.4*  --   AST 46*  --   --   --  17 12*  --   ALT 92*  --   --   --  46* 33  --   ALKPHOS 74  --   --   --  71 73  --   BILITOT 1.2  --   --   --  0.7 0.5  --   GFRNONAA 39*   < > 39*  --  33* 26*  --   GFRAA 45*   < > 45*  --  39* 31*  --   ANIONGAP 14   < > 11  --  9 14  --    < > = values in this interval not displayed.     Hematology Recent Labs  Lab 12/08/19 1631 12/11/19 0857 12/13/19 0133  WBC 7.7 9.3  --   RBC 4.47 4.35  --   HGB 12.7 12.5 13.6  HCT 41.0 39.9 40.0  MCV 91.7 91.7  --   MCH 28.4 28.7  --   MCHC 31.0 31.3  --   RDW 14.3 13.8  --   PLT 382 348  --     BNPNo results for input(s): BNP, PROBNP in the last 168 hours.   DDimer No results for input(s): DDIMER in the last 168 hours.   Radiology     DG  CHEST PORT 1 VIEW Result Date: 12/08/2019 CLINICAL DATA:  Shortness of breath. EXAM: PORTABLE CHEST 1 VIEW COMPARISON:  July 01, 2017. FINDINGS: Stable cardiomegaly. No pneumothorax or pleural effusion is noted. Both lungs are clear. The visualized skeletal structures are unremarkable. IMPRESSION: No active disease. Electronically Signed   By: Marijo Conception M.D.   On: 12/08/2019 18:59      Cardiac Studies   EP STUDY Result Date: 12/09/2019 CONCLUSIONS: 1. Isthmus-dependent right atrial flutter upon presentation. 2. Successful radiofrequency ablation of atrial flutter along the cavotricuspid isthmus with complete bidirectional isthmus block achieved. 3. No inducible arrhythmias following ablation. 4. No early apparent complications. Cristopher Peru, MD 4:53 PM 12/09/2019    12/10/2019: TTE IMPRESSIONS  1. Left ventricular ejection fraction, by visual estimation, is 30 to  35%. The left ventricle has moderate to severely decreased function. There  is no left ventricular hypertrophy.   2. Mildly dilated left ventricular internal cavity size.   3. The left ventricle demonstrates regional wall motion abnormalities.   4. Septal apical and inferior wall hypokinesis. Focal area of mid  inferior wall akinesis with no obvious area of rupture.   5. Global right ventricle has normal systolic function.The right  ventricular size is normal. No increase in right ventricular wall  thickness.   6. Left atrial size was mildly dilated.   7. Right atrial size was normal.   8. Small pericardial effusion.   9. The pericardial effusion is posterior to the left ventricle and  lateral to the left ventricle.  10. Small to moderate nearly circumferential effusion IVC dilated no  diastolic RV collapse.  11. The mitral valve is normal in structure.  Mild mitral valve  regurgitation. No evidence of mitral stenosis.  12. The tricuspid valve is normal in structure.  13. The tricuspid valve is normal in structure.  Tricuspid valve  regurgitation is mild.  14. The aortic valve is tricuspid. Aortic valve regurgitation is not  visualized. Mild aortic valve sclerosis without stenosis.  15. Pulmonic regurgitation is mild.  16. The pulmonic valve was normal in structure. Pulmonic valve  regurgitation is mild.  17. Moderately elevated pulmonary artery systolic pressure.  18. The inferior vena cava is normal in size with greater than 50%  respiratory variability, suggesting right atrial pressure of 3 mmHg.     07/02/2017: TTE Study Conclusions - Left ventricle: The cavity size was normal. Wall thickness was    increased in a pattern of mild LVH. Systolic function was normal.    The estimated ejection fraction was in the range of 55% to 60%.    Wall motion was normal; there were no regional wall motion    abnormalities. Features are consistent with a pseudonormal left    ventricular filling pattern, with concomitant abnormal relaxation    and increased filling pressure (grade 2 diastolic dysfunction).  - Ventricular septum: The contour showed diastolic flattening.  - Mitral valve: There was moderate regurgitation.  - Left atrium: The atrium was mildly dilated.  - Right ventricle: The cavity size was mildly dilated. Wall    thickness was normal.  - Tricuspid valve: There was trivial regurgitation.  - Pulmonary arteries: Systolic pressure was severely increased. PA    peak pressure: 81 mm Hg (S).   Patient Profile     79 y.o. female with a hx of COPD, DM, HTN, HLD, vertigo, hypothyroidism, AFlutter (ablated 2011, Dr. Caryl Comes) and subsequent Afib admitted with AFlutter w/RVR, conducting 1:1 at 202bpm  Assessment & Plan    1. AFlutter w/RVR 2. Afib by history as well Currently on Xarelto and IV amiodarone.  She is continued to have episodes of tachycardia that did not respond to adenosine given yesterday.  She was given IV digoxin which did slow her rates.  We Aria Jarrard continue with IV amiodarone and  hopefully she Kaedence Connelly convert back to sinus rhythm.  If she does not, she may need either cardioversion tomorrow, or potentially further invasive therapy with AV node ablation and pacemaker.  3. New CM Possibly due to a tachycardia mediated cardiomyopathy.  Kimori Tartaglia likely improve with improved rate control.  She would potentially need a left bundle pacing versus CRT-P should she require AV nodal ablation.     For questions or updates, please contact [place] Please consult [email or web address] for contact info under     Signed, Lydia Deas Meredith Leeds, MD  12/13/2019, 9:16 AM

## 2019-12-13 NOTE — Plan of Care (Signed)
Magnesium replacement ordered for pt but incompatible with Amiodarone infusion. Pt only has one IV access. Pt states that she is bruised from being stuck by needles and does not want another IV placed at this time. Ordering physician paged. Awaiting callback.

## 2019-12-14 ENCOUNTER — Inpatient Hospital Stay (HOSPITAL_COMMUNITY)
Admission: EM | Disposition: A | Discharge status: Home / Self Care | Payer: Self-pay | Source: Home / Self Care | Attending: Cardiology

## 2019-12-14 DIAGNOSIS — I4891 Unspecified atrial fibrillation: Secondary | ICD-10-CM

## 2019-12-14 HISTORY — PX: AV NODE ABLATION: EP1193

## 2019-12-14 HISTORY — PX: PACEMAKER IMPLANT: EP1218

## 2019-12-14 LAB — GLUCOSE, CAPILLARY
Glucose-Capillary: 126 mg/dL — ABNORMAL HIGH (ref 70–99)
Glucose-Capillary: 201 mg/dL — ABNORMAL HIGH (ref 70–99)
Glucose-Capillary: 155 mg/dL — ABNORMAL HIGH (ref 70–99)
Glucose-Capillary: 144 mg/dL — ABNORMAL HIGH (ref 70–99)
Glucose-Capillary: 115 mg/dL — ABNORMAL HIGH (ref 70–99)

## 2019-12-14 LAB — SURGICAL PCR SCREEN
MRSA, PCR: NEGATIVE
Staphylococcus aureus: NEGATIVE

## 2019-12-14 SURGERY — AV NODE ABLATION

## 2019-12-14 MED ORDER — MIDAZOLAM HCL 5 MG/5ML IJ SOLN
INTRAMUSCULAR | Status: DC | PRN
  Administered 2019-12-14 (×5): 1 mg via INTRAVENOUS
  Administered 2019-12-14: 2 mg via INTRAVENOUS
  Administered 2019-12-14: 1 mg via INTRAVENOUS

## 2019-12-14 MED ORDER — MIDAZOLAM HCL 5 MG/5ML IJ SOLN
INTRAMUSCULAR | Status: AC
  Filled 2019-12-14: qty 5

## 2019-12-14 MED ORDER — CEFAZOLIN SODIUM-DEXTROSE 2-4 GM/100ML-% IV SOLN
INTRAVENOUS | Status: AC
  Filled 2019-12-14: qty 100

## 2019-12-14 MED ORDER — BUPIVACAINE HCL (PF) 0.25 % IJ SOLN
INTRAMUSCULAR | Status: AC
  Filled 2019-12-14: qty 30

## 2019-12-14 MED ORDER — BUPIVACAINE HCL (PF) 0.25 % IJ SOLN
INTRAMUSCULAR | Status: DC | PRN
  Administered 2019-12-14: 30 mL

## 2019-12-14 MED ORDER — HYALURONIDASE HUMAN 150 UNIT/ML IJ SOLN
150.0000 [IU] | INTRAMUSCULAR | Status: AC
  Administered 2019-12-14: 19:00:00 150 [IU] via SUBCUTANEOUS
  Filled 2019-12-14: qty 1

## 2019-12-14 MED ORDER — LIDOCAINE HCL 1 % IJ SOLN
INTRAMUSCULAR | Status: AC
  Filled 2019-12-14: qty 60

## 2019-12-14 MED ORDER — SODIUM CHLORIDE 0.9 % IV SOLN
INTRAVENOUS | Status: AC
  Filled 2019-12-14: qty 2

## 2019-12-14 MED ORDER — CEFAZOLIN SODIUM-DEXTROSE 1-4 GM/50ML-% IV SOLN
1.0000 g | Freq: Four times a day (QID) | INTRAVENOUS | Status: AC
  Administered 2019-12-14 – 2019-12-15 (×3): 1 g via INTRAVENOUS
  Filled 2019-12-14 (×3): qty 50

## 2019-12-14 MED ORDER — SODIUM CHLORIDE 0.9 % IV SOLN: INTRAVENOUS | Status: DC

## 2019-12-14 MED ORDER — CHLORHEXIDINE GLUCONATE 4 % EX LIQD
60.0000 mL | Freq: Once | CUTANEOUS | Status: AC
  Administered 2019-12-14: 4 via TOPICAL
  Filled 2019-12-14: qty 30

## 2019-12-14 MED ORDER — HEPARIN (PORCINE) IN NACL 1000-0.9 UT/500ML-% IV SOLN
INTRAVENOUS | Status: DC | PRN
  Administered 2019-12-14 (×2): 500 mL

## 2019-12-14 MED ORDER — LIDOCAINE HCL (PF) 1 % IJ SOLN
INTRAMUSCULAR | Status: DC | PRN
  Administered 2019-12-14: 60 mL

## 2019-12-14 MED ORDER — ACETAMINOPHEN 325 MG PO TABS: 325.0000 mg | ORAL_TABLET | ORAL | Status: DC | PRN

## 2019-12-14 MED ORDER — CEFAZOLIN SODIUM-DEXTROSE 2-4 GM/100ML-% IV SOLN
2.0000 g | INTRAVENOUS | Status: AC
  Administered 2019-12-14: 16:00:00 2 g via INTRAVENOUS
  Filled 2019-12-14: qty 100

## 2019-12-14 MED ORDER — FENTANYL CITRATE (PF) 100 MCG/2ML IJ SOLN
INTRAMUSCULAR | Status: DC | PRN
  Administered 2019-12-14 (×6): 12.5 ug via INTRAVENOUS

## 2019-12-14 MED ORDER — ONDANSETRON HCL 4 MG/2ML IJ SOLN: 4.0000 mg | Freq: Four times a day (QID) | INTRAMUSCULAR | Status: DC | PRN

## 2019-12-14 MED ORDER — SODIUM CHLORIDE 0.9 % IV SOLN
80.0000 mg | INTRAVENOUS | Status: AC
  Administered 2019-12-14: 80 mg
  Filled 2019-12-14: qty 2

## 2019-12-14 MED ORDER — HEPARIN (PORCINE) IN NACL 1000-0.9 UT/500ML-% IV SOLN
INTRAVENOUS | Status: AC
  Filled 2019-12-14: qty 500

## 2019-12-14 MED ORDER — FENTANYL CITRATE (PF) 100 MCG/2ML IJ SOLN
INTRAMUSCULAR | Status: AC
  Filled 2019-12-14: qty 2

## 2019-12-14 MED ORDER — CHLORHEXIDINE GLUCONATE 4 % EX LIQD: 60.0000 mL | Freq: Once | CUTANEOUS | Status: DC

## 2019-12-14 SURGICAL SUPPLY — 19 items
CABLE SURGICAL S-101-97-12 (CABLE) ×2 IMPLANT
CATH CELSIUS THERMO F CV 7FR (ABLATOR) ×2 IMPLANT
CATH RIGHTSITE C315HIS02 (CATHETERS) ×2 IMPLANT
GUIDEWIRE ANGLED .035X150CM (WIRE) ×2 IMPLANT
IPG PACE AZUR XT DR MRI W1DR01 (Pacemaker) ×1 IMPLANT
KIT MICROPUNCTURE NIT STIFF (SHEATH) ×2 IMPLANT
LEAD CAPSURE NOVUS 5076-52CM (Lead) ×2 IMPLANT
LEAD SELECT SECURE 3830 383069 (Lead) ×1 IMPLANT
PACE AZURE XT DR MRI W1DR01 (Pacemaker) ×2 IMPLANT
PACK EP LATEX FREE (CUSTOM PROCEDURE TRAY) ×2
PACK EP LF (CUSTOM PROCEDURE TRAY) ×1 IMPLANT
PAD PRO RADIOLUCENT 2001M-C (PAD) ×2 IMPLANT
SELECT SECURE 3830 383069 (Lead) ×2 IMPLANT
SHEATH 7FR PRELUDE SNAP 13 (SHEATH) ×4 IMPLANT
SHEATH 7FR PRELUDE SNAP 25 (SHEATH) ×2 IMPLANT
SHEATH 8FR PRELUDE SNAP 13 (SHEATH) ×4 IMPLANT
SLITTER 6232ADJ (MISCELLANEOUS) ×2 IMPLANT
TRAY PACEMAKER INSERTION (PACKS) ×2 IMPLANT
WIRE HI TORQ VERSACORE-J 145CM (WIRE) ×2 IMPLANT

## 2019-12-14 NOTE — Progress Notes (Signed)
Site area: Right groin a 8 french arterial and 8 french venous sheath ws removed.  Site Prior to Removal:  Level 0  Pressure Applied For 25 MINUTES    Bedrest Beginning at 1900  Manual:   Yes.    Patient Status During Pull:  stable  Post Pull Groin Site:  Level 0  Post Pull Instructions Given:  Yes.    Post Pull Pulses Present:  Yes.    Dressing Applied:  Yes.    Comments:

## 2019-12-14 NOTE — Plan of Care (Signed)
  Problem: Clinical Measurements: Goal: Respiratory complications will improve Outcome: Progressing   Problem: Activity: Goal: Risk for activity intolerance will decrease Outcome: Progressing   Problem: Coping: Goal: Level of anxiety will decrease Outcome: Progressing   Problem: Pain Managment: Goal: General experience of comfort will improve Outcome: Progressing   Problem: Safety: Goal: Ability to remain free from injury will improve Outcome: Progressing   Problem: Skin Integrity: Goal: Risk for impaired skin integrity will decrease Outcome: Progressing  Elesa Hacker, RN

## 2019-12-14 NOTE — Progress Notes (Addendum)
Electrophysiology Rounding Note  Patient Name: Johnnye Measel Arno Date of Encounter: 12/14/2019  Electrophysiologist: Dr. Rayann Heman  Subjective   The patient is feeling OK this am. Continues to go in and out of rhythm. Main complaints are fatigue and SOB with moderate exertion.   Inpatient Medications    Scheduled Meds:  atorvastatin  10 mg Oral q1800   brinzolamide  1 drop Both Eyes BID   Chlorhexidine Gluconate Cloth  6 each Topical Daily   insulin aspart  0-15 Units Subcutaneous TID WC   insulin aspart  0-5 Units Subcutaneous QHS   latanoprost  1 drop Both Eyes QHS   levothyroxine  25 mcg Oral QAC breakfast   linagliptin  5 mg Oral QHS   loratadine  10 mg Oral Daily   metFORMIN  500 mg Oral BID WC   potassium chloride  10 mEq Oral Daily   Rivaroxaban  15 mg Oral Q supper   sodium chloride flush  3 mL Intravenous Q12H   Continuous Infusions:  sodium chloride Stopped (12/13/19 0214)   amiodarone 60 mg/hr (12/14/19 0600)   phenylephrine (NEO-SYNEPHRINE) Adult infusion Stopped (12/12/19 [id number])   PRN Meds: sodium chloride, acetaminophen, adenosine (ADENOCARD) IV, ALPRAZolam, magnesium hydroxide, nitroGLYCERIN, ondansetron (ZOFRAN) IV, sodium chloride flush, white petrolatum, zolpidem   Vital Signs    Vitals:   12/14/19 0353 12/14/19 0400 12/14/19 0500 12/14/19 0600  BP:  117/86 130/76 127/61  Pulse:   (!) 37 (!) 38  Resp:  (!) 22 (!) 21 18  Temp: 98.4 F (36.9 C)     TempSrc: Oral     SpO2:  97% 97% 98%  Weight:      Height:        Intake/Output Summary (Last 24 hours) at 12/14/2019 0659 Last data filed at 12/14/2019 0600 Gross per 24 hour  Intake 1278.06 ml  Output 1100 ml  Net 178.06 ml   Filed Weights   12/10/19 0430 12/12/19 0500 12/13/19 0500  Weight: 69.3 kg 73.3 kg 73.5 kg    Physical Exam    GEN- The patient is elderly appearing, alert and oriented x 3 today.   Head- normocephalic, atraumatic Eyes-  Sclera clear, conjunctiva pink Ears- hearing  intact Oropharynx- clear Neck- supple Lungs- Clear to ausculation bilaterally, normal work of breathing Heart- Tachy, somewhat irregular rate and rhythm, no murmurs, rubs or gallops GI- soft, NT, ND, + BS Extremities- no clubbing, cyanosis, or edema Skin- no rash or lesion Psych- euthymic mood, full affect Neuro- strength and sensation are intact  Labs    CBC Recent Labs    12/11/19 0857 12/13/19 0133  WBC 9.3  --   HGB 12.5 13.6  HCT 39.9 40.0  MCV 91.7  --   PLT 348  --    Basic Metabolic Panel Recent Labs    12/11/19 0857 12/11/19 0857 12/13/19 0122 12/13/19 0133  NA 138   < > 134* 134*  K 4.6   < > 4.8 4.6  CL 104  --  100  --   CO2 25  --  20*  --   GLUCOSE 195*  --  202*  --   BUN 22  --  28*  --   CREATININE 1.47*  --  1.78*  --   CALCIUM 8.5*  --  8.9  --   MG  --   --  1.8  --    < > = values in this interval not displayed.   Liver Function Tests Recent  Labs    12/11/19 0857 12/13/19 0122  AST 17 12*  ALT 46* 33  ALKPHOS 71 73  BILITOT 0.7 0.5  PROT 5.9* 5.9*  ALBUMIN 3.4* 3.4*   No results for input(s): LIPASE, AMYLASE in the last 72 hours. Cardiac Enzymes No results for input(s): CKTOTAL, CKMB, CKMBINDEX, TROPONINI in the last 72 hours.   Telemetry    Atrial tachycardia with intermittent conduction, rates 110-150s (personally reviewed)  Radiology    No results found.  Patient Profile     79 y.o. female with a hx of COPD, DM, HTN, HLD, vertigo, hypothyroidism, AFlutter (ablated 2011, Dr. Caryl Comes) and subsequent Afib admitted with AFlutter w/RVR, conducting 1:1 at 202bpm. Underwent EPS 12/09/2019 with ablation of AFL, but unfortunately had recurrence.   Assessment & Plan    1. Aflutter w/RVR (AF also by history S/p ablation 2011 and repeat 12/09/2019 She had recurrent tachycardia and did not respond to adenosine over the weekend.  Given IV digoxin  Continue IV amiodarone.  Will discuss further plan with Dr. Lovena Le.  2. New CMP Echo  12/10/2019 LVEF 30-35%. LA mildly dilated. Normal RA size. Possibly tachycardia related. Suspect will improve with rate control.  ? Need for LBBB pacing versus CRT-P if requires AV nodal ablation.    ADDENDUM: Revisited with patient. She agrees to proceed with AV nodal ablation and PPM implantation today. She had no additional questions at this time.   For questions or updates, please contact [place] Please consult [email or web address] for contact info under Cardiology/STEMI.  Signed, Shirley Friar, PA-C  12/14/2019, 6:59 AM   EP Attending  Patient seen and examined. She has mostly continued to have persistent atrial arrhythmias over the weekend including atrial fib, atrial tachycardia, plus/minus atrial flutter. In light of all and refractory to amiodarone and severely symptomatic, I have recommended she undergo AV node ablation and PPM insertion. I have reviewed the indications/risks/benefits and she is considering her options. She would like to go home and if she does with rapid rates, then she will do poorly without adequate rate control.   Mikle Bosworth.D.

## 2019-12-15 ENCOUNTER — Inpatient Hospital Stay (HOSPITAL_COMMUNITY): Payer: Medicare Other

## 2019-12-15 DIAGNOSIS — I447 Left bundle-branch block, unspecified: Secondary | ICD-10-CM

## 2019-12-15 DIAGNOSIS — Z950 Presence of cardiac pacemaker: Secondary | ICD-10-CM

## 2019-12-15 LAB — BASIC METABOLIC PANEL
Anion gap: 16 — ABNORMAL HIGH (ref 5–15)
BUN: 27 mg/dL — ABNORMAL HIGH (ref 8–23)
CO2: 19 mmol/L — ABNORMAL LOW (ref 22–32)
Calcium: 9.2 mg/dL (ref 8.9–10.3)
Chloride: 100 mmol/L (ref 98–111)
Creatinine, Ser: 1.79 mg/dL — ABNORMAL HIGH (ref 0.44–1.00)
GFR calc Af Amer: 30 mL/min — ABNORMAL LOW (ref >60)
GFR calc non Af Amer: 26 mL/min — ABNORMAL LOW (ref >60)
Glucose, Bld: 213 mg/dL — ABNORMAL HIGH (ref 70–99)
Potassium: 5.3 mmol/L — ABNORMAL HIGH (ref 3.5–5.1)
Sodium: 135 mmol/L (ref 135–145)

## 2019-12-15 LAB — GLUCOSE, CAPILLARY
Glucose-Capillary: 138 mg/dL — ABNORMAL HIGH (ref 70–99)
Glucose-Capillary: 212 mg/dL — ABNORMAL HIGH (ref 70–99)

## 2019-12-15 MED ORDER — AMIODARONE HCL 200 MG PO TABS
200.0000 mg | ORAL_TABLET | Freq: Two times a day (BID) | ORAL | Status: DC
  Administered 2019-12-15: 11:00:00 200 mg via ORAL
  Filled 2019-12-15: qty 1

## 2019-12-15 MED ORDER — METOPROLOL SUCCINATE ER 25 MG PO TB24: 25.0000 mg | ORAL_TABLET | Freq: Every day | ORAL | 11 refills | Status: DC

## 2019-12-15 MED ORDER — POTASSIUM CHLORIDE CRYS ER 10 MEQ PO TBCR: EXTENDED_RELEASE_TABLET | ORAL | 2 refills | Status: DC

## 2019-12-15 MED ORDER — ACETAMINOPHEN 325 MG PO TABS: 325.0000 mg | ORAL_TABLET | ORAL | Status: DC | PRN

## 2019-12-15 MED ORDER — AMIODARONE HCL 200 MG PO TABS: 200.0000 mg | ORAL_TABLET | Freq: Two times a day (BID) | ORAL | 6 refills | Status: DC

## 2019-12-15 MED ORDER — RIVAROXABAN 15 MG PO TABS: ORAL_TABLET | ORAL | 6 refills | Status: DC

## 2019-12-15 MED FILL — Lidocaine HCl Local Inj 1%: INTRAMUSCULAR | Qty: 60 | Status: AC

## 2019-12-15 NOTE — Care Management Important Message (Signed)
Important Message  Patient Details  Name: Lydia Myers MRN: [id number] Date of Birth: [birth year]   Medicare Important Message Given:  Yes     Shelda Altes 12/15/2019, 12:36 PM

## 2019-12-15 NOTE — Progress Notes (Signed)
Spoke with Oda Kilts, PA.  He stated to have patient resume lasix on Thursday and to only take potassium on days she takes lasix.

## 2019-12-17 ENCOUNTER — Ambulatory Visit: Payer: Medicare Other | Admitting: Physician Assistant

## 2019-12-23 ENCOUNTER — Telehealth: Payer: Self-pay | Admitting: *Deleted

## 2019-12-23 NOTE — Telephone Encounter (Signed)
The pt called stating she went to her pcp and they took the bandage off and her wound looks good. She is cancelling her appointment and she will not reschedule. She states the nurse can call back if she like.

## 2019-12-23 NOTE — Telephone Encounter (Signed)
LMOM requesting call back to DC. Direct number provided. Will attempt to reschedule wound check and BMET to today.  Also attempted to reach pt's friend, Randell Patient. No answer, unidentified VM so did not LM.

## 2019-12-23 NOTE — Telephone Encounter (Signed)
Spoke with patient. Advised she will still need to come in for wound check. Pt unable to arrange transportation today. Agreeable to rescheduling appointment to 12/30/19 at 2:00pm. Pt reports she had labs drawn at PCP office this AM. Will contact office to request results faxed to our office. Pt in agreement with plan.  Spoke with Beverlee Nims at Dr. Delene Ruffini office. Fax number provided. She agrees to fax results when they are available.

## 2019-12-24 ENCOUNTER — Ambulatory Visit: Payer: Medicare Other

## 2019-12-25 NOTE — Telephone Encounter (Signed)
Labs received from [facility] at [place]. Given to medical records for scanning.

## 2019-12-28 ENCOUNTER — Telehealth: Payer: Self-pay

## 2019-12-28 NOTE — Telephone Encounter (Signed)
Pt called to confirm her appointment. She states she will have someone with her.

## 2019-12-30 ENCOUNTER — Ambulatory Visit (INDEPENDENT_AMBULATORY_CARE_PROVIDER_SITE_OTHER): Payer: Medicare Other | Admitting: *Deleted

## 2019-12-30 ENCOUNTER — Other Ambulatory Visit: Payer: Self-pay

## 2019-12-30 DIAGNOSIS — I480 Paroxysmal atrial fibrillation: Secondary | ICD-10-CM

## 2019-12-30 DIAGNOSIS — Z950 Presence of cardiac pacemaker: Secondary | ICD-10-CM

## 2019-12-30 DIAGNOSIS — I442 Atrioventricular block, complete: Secondary | ICD-10-CM | POA: Diagnosis not present

## 2019-12-30 LAB — CUP PACEART INCLINIC DEVICE CHECK
Battery Remaining Longevity: 117 mo
Battery Voltage: 3.2 V
Brady Statistic AP VP Percent: 93.79 %
Brady Statistic AP VS Percent: 0.01 %
Brady Statistic AS VP Percent: 6.06 %
Brady Statistic AS VS Percent: 0.12 %
Brady Statistic RA Percent Paced: 68.3 %
Brady Statistic RV Percent Paced: 99.78 %
Date Time Interrogation Session: 20210224143036
Implantable Lead Implant Date: 20210208
Implantable Lead Implant Date: 20210208
Implantable Lead Location: 753859
Implantable Lead Location: 753860
Implantable Lead Model: 3830
Implantable Lead Model: 5076
Implantable Pulse Generator Implant Date: 20210208
Lead Channel Impedance Value: 361 Ohm
Lead Channel Impedance Value: 361 Ohm
Lead Channel Impedance Value: 475 Ohm
Lead Channel Impedance Value: 532 Ohm
Lead Channel Pacing Threshold Amplitude: 0.5 V
Lead Channel Pacing Threshold Amplitude: 0.75 V
Lead Channel Pacing Threshold Pulse Width: 0.4 ms
Lead Channel Pacing Threshold Pulse Width: 0.4 ms
Lead Channel Sensing Intrinsic Amplitude: 3.875 mV
Lead Channel Setting Pacing Amplitude: 3.5 V
Lead Channel Setting Pacing Amplitude: 3.5 V
Lead Channel Setting Pacing Pulse Width: 0.4 ms
Lead Channel Setting Sensing Sensitivity: 1.2 mV

## 2019-12-30 NOTE — Progress Notes (Signed)
Wound check appointment. Steri-strips removed at recent PCP visit per patient. Wound without redness or edema. Incision edges approximated, wound well healed. Normal device function. Thresholds, sensing, and impedances consistent with implant measurements. Device programmed at 3.5V with auto capture on for extra safety margin until 3 month visit. Histogram distribution blunted due to programming at DDD 80bpm s/p AVN ablation. Per verbal order from A. Tillery, PA-C, enabled rate response at nominal settings today. Patient reports improvement in SOB when walking back to the lobby. 2 AT/AF episodes (27.2% burden), on Xarelto. No high ventricular rates noted. Patient educated about wound care, arm mobility, lifting restrictions, and Carelink monitor. Patient to setup monitor when she returns home today. ROV with A. Tillery, PA-C, on 01/18/20 for base rate reprogramming.

## 2020-01-08 ENCOUNTER — Ambulatory Visit: Payer: Medicare Other | Admitting: Internal Medicine

## 2020-01-15 NOTE — Progress Notes (Signed)
Electrophysiology Office Note Date: 01/18/2020  ID:  Lydia Limbo Kirst, DOB 1941/03/18, MRN [id number]  PCP: Kelton Pillar, MD Electrophysiologist: Cristopher Peru, MD   CC: Pacemaker follow-up  Lydia Myers is a 79 y.o. female seen today for Dr. Lovena Le . she presents today for routine electrophysiology followup.  Since last being seen in our clinic, the patient reports doing very well.  she denies chest pain, palpitations, dyspnea, PND, orthopnea, nausea, vomiting, dizziness, syncope, edema, weight gain, or early satiety.  She has long standing globus sensation when swallowing. This is exacerbated by water, bread, rice, and meat.  It is not worse or better recently, just persistent. She has not talked to her PCP about it, and has never seen a GI specialist  Device History: Medtronic Dual Chamber PPM implanted 12/14/2019 for uncontrolled AF/AFL/tachycardia s/p AV nodal ablation  Past Medical History:  Diagnosis Date  . Arthritis   . Asthma   . Cardiomyopathy ([place])   . COPD (chronic obstructive pulmonary disease) ([place])   . Diabetes mellitus   . Glaucoma   . High cholesterol   . Hypertension   . Systolic dysfunction, left ventricle    Past Surgical History:  Procedure Laterality Date  . A-FLUTTER ABLATION N/A 12/09/2019   Procedure: A-FLUTTER ABLATION;  Surgeon: Evans Lance, MD;  Location: [facility];  Service: Cardiovascular;  Laterality: N/A;  . ABDOMINAL HYSTERECTOMY    . AV NODE ABLATION N/A 12/14/2019   Procedure: AV NODE ABLATION;  Surgeon: Evans Lance, MD;  Location: [facility];  Service: Cardiovascular;  Laterality: N/A;  . HIP SURGERY    . JOINT REPLACEMENT    . left lumpectomy    . PACEMAKER IMPLANT N/A 12/14/2019   Procedure: PACEMAKER IMPLANT;  Surgeon: Evans Lance, MD;  Location: [facility];  Service: Cardiovascular;  Laterality: N/A;  . REPLACEMENT TOTAL KNEE BILATERAL     2009-Right, 2001-Left  . Right bog toe fusion    . Right  trigger finger surgery      Current Outpatient Medications  Medication Sig Dispense Refill  . acetaminophen (TYLENOL) 325 MG tablet Take 1-2 tablets (325-650 mg total) by mouth every 4 (four) hours as needed for mild pain.    Marland Kitchen amiodarone (PACERONE) 200 MG tablet Take 1 tablet (200 mg total) by mouth 2 (two) times daily. 60 tablet 6  . atorvastatin (LIPITOR) 10 MG tablet TAKE 1 TABLET BY MOUTH EVERY DAY AT 6PM 90 tablet 0  . AZOPT 1 % ophthalmic suspension Place 1 drop into both eyes 2 (two) times daily.  0  . furosemide (LASIX) 20 MG tablet TAKE 1 TABLET BY MOUTH EVERY 3 DAYS 15 tablet 6  . JANUVIA 100 MG tablet Take 100 mg by mouth daily.  0  . levothyroxine (SYNTHROID, LEVOTHROID) 25 MCG tablet Take 1 tablet (25 mcg total) by mouth daily before breakfast. 30 tablet 0  . metoprolol succinate (TOPROL XL) 25 MG 24 hr tablet Take 1 tablet (25 mg total) by mouth daily. 30 tablet 11  . olmesartan (BENICAR) 40 MG tablet Take 40 mg by mouth daily.    . potassium chloride (KLOR-CON M10) 10 MEQ tablet Take 1 tablet (10 meq) only on days that you take lasix. 90 tablet 2  . Rivaroxaban (XARELTO) 15 MG TABS tablet TAKE 1 TABLET BY MOUTH EVERY DAY WITH DINNER. RESUME Thursday, 2/11 30 tablet 6  . TRAVATAN Z 0.004 % SOLN ophthalmic solution Place 1 drop into  both eyes every evening.  0   No current facility-administered medications for this visit.    Allergies:   Shrimp [shellfish allergy]   Social History: Social History   Socioeconomic History  . Marital status: Single    Spouse name: Not on file  . Number of children: Not on file  . Years of education: Not on file  . Highest education level: Not on file  Occupational History  . Not on file  Tobacco Use  . Smoking status: Never Smoker  . Smokeless tobacco: Never Used  Substance and Sexual Activity  . Alcohol use: No  . Drug use: No  . Sexual activity: Never  Other Topics Concern  . Not on file  Social History Narrative  . Not on  file   Social Determinants of Health   Financial Resource Strain:   . Difficulty of Paying Living Expenses:   Food Insecurity:   . Worried About Charity fundraiser in the Last Year:   . Arboriculturist in the Last Year:   Transportation Needs:   . Film/video editor (Medical):   Marland Kitchen Lack of Transportation (Non-Medical):   Physical Activity:   . Days of Exercise per Week:   . Minutes of Exercise per Session:   Stress:   . Feeling of Stress :   Social Connections:   . Frequency of Communication with Friends and Family:   . Frequency of Social Gatherings with Friends and Family:   . Attends Religious Services:   . Active Member of Clubs or Organizations:   . Attends Archivist Meetings:   Marland Kitchen Marital Status:   Intimate Partner Violence:   . Fear of Current or Ex-Partner:   . Emotionally Abused:   Marland Kitchen Physically Abused:   . Sexually Abused:     Family History: Family History  Problem Relation Age of Onset  . Diabetes Brother      Review of Systems: All other systems reviewed and are otherwise negative except as noted above.  Physical Exam: Vitals:   01/18/20 0908  BP: (!) 148/82  Pulse: 85  SpO2: 92%  Weight: 158 lb 12.8 oz (72 kg)  Height: 5\' 4"  (1.626 m)     GEN- The patient is well appearing, alert and oriented x 3 today.   HEENT: normocephalic, atraumatic; sclera clear, conjunctiva pink; hearing intact; oropharynx clear; neck supple  Lungs- Clear to ausculation bilaterally, normal work of breathing.  No wheezes, rales, rhonchi Heart- Regular rate and rhythm, no murmurs, rubs or gallops  GI- soft, non-tender, non-distended, bowel sounds present  Extremities- no clubbing, cyanosis, or edema  MS- no significant deformity or atrophy Skin- warm and dry, no rash or lesion; PPM pocket well healed Psych- euthymic mood, full affect Neuro- strength and sensation are intact  PPM Interrogation- reviewed in detail today,  See PACEART report  EKG:  EKG is  not ordered today.  Recent Labs: 12/09/2019: TSH 2.335 12/11/2019: Platelets 348 12/13/2019: ALT 33; Hemoglobin 13.6; Magnesium 1.8 12/15/2019: BUN 27; Creatinine, Ser 1.79; Potassium 5.3; Sodium 135   Wt Readings from Last 3 Encounters:  01/18/20 158 lb 12.8 oz (72 kg)  12/14/19 156 lb 4.9 oz (70.9 kg)  12/08/19 166 lb (75.3 kg)     Other studies Reviewed: Additional studies/ records that were reviewed today include: Echo 12/2019 shows LVEF 30-35%, Previous EP office notes, Previous remote checks, Most recent labwork.   Assessment and Plan:  1. Tachy-Brady syndrome s/p AV node ablation and  Medtronic PPM  Normal PPM function See Pace Art report No changes today LRL programmed down to 70 today.  Keep 91 day follow up 03/2020 with Dr. Lovena Le.  2. Globus sensation/Progressive dysphagia Chronic. Recommended she seek a referral to GI via her PCP.    Current medicines are reviewed at length with the patient today.   The patient does not have concerns regarding her medicines.  The following changes were made today:  none  Labs/ tests ordered today include:  No orders of the defined types were placed in this encounter.   Disposition:   Follow up with Dr. Lovena Le in as scheduled.   Jacalyn Lefevre, PA-C  01/18/2020 9:50 AM  [facility] [address] [place] [address] [phone] (office) [phone] (fax)

## 2020-01-18 ENCOUNTER — Other Ambulatory Visit: Payer: Self-pay

## 2020-01-18 ENCOUNTER — Ambulatory Visit (INDEPENDENT_AMBULATORY_CARE_PROVIDER_SITE_OTHER): Payer: Medicare Other | Admitting: Student

## 2020-01-18 ENCOUNTER — Encounter: Payer: Self-pay | Admitting: Student

## 2020-01-18 VITALS — BP 148/82 | HR 85 | Ht 64.0 in | Wt 158.8 lb

## 2020-01-18 DIAGNOSIS — I442 Atrioventricular block, complete: Secondary | ICD-10-CM

## 2020-01-18 DIAGNOSIS — I480 Paroxysmal atrial fibrillation: Secondary | ICD-10-CM | POA: Diagnosis not present

## 2020-01-18 DIAGNOSIS — Z950 Presence of cardiac pacemaker: Secondary | ICD-10-CM

## 2020-01-18 DIAGNOSIS — I4821 Permanent atrial fibrillation: Secondary | ICD-10-CM | POA: Diagnosis not present

## 2020-01-18 LAB — CUP PACEART INCLINIC DEVICE CHECK
Battery Remaining Longevity: 114 mo
Battery Voltage: 3.18 V
Brady Statistic AP VP Percent: 97.12 %
Brady Statistic AP VS Percent: 0.02 %
Brady Statistic AS VP Percent: 2.84 %
Brady Statistic AS VS Percent: 0.03 %
Brady Statistic RA Percent Paced: 96.76 %
Brady Statistic RV Percent Paced: 99.96 %
Date Time Interrogation Session: 20210315095336
Implantable Lead Implant Date: 20210208
Implantable Lead Implant Date: 20210208
Implantable Lead Location: 753859
Implantable Lead Location: 753860
Implantable Lead Model: 3830
Implantable Lead Model: 5076
Implantable Pulse Generator Implant Date: 20210208
Lead Channel Impedance Value: 380 Ohm
Lead Channel Impedance Value: 380 Ohm
Lead Channel Impedance Value: 399 Ohm
Lead Channel Impedance Value: 399 Ohm
Lead Channel Impedance Value: 475 Ohm
Lead Channel Impedance Value: 475 Ohm
Lead Channel Impedance Value: 589 Ohm
Lead Channel Impedance Value: 589 Ohm
Lead Channel Pacing Threshold Amplitude: 0.875 V
Lead Channel Pacing Threshold Amplitude: 0.875 V
Lead Channel Pacing Threshold Pulse Width: 0.4 ms
Lead Channel Pacing Threshold Pulse Width: 0.4 ms
Lead Channel Sensing Intrinsic Amplitude: 2.5 mV
Lead Channel Sensing Intrinsic Amplitude: 2.5 mV
Lead Channel Sensing Intrinsic Amplitude: 4 mV
Lead Channel Sensing Intrinsic Amplitude: 4 mV
Lead Channel Sensing Intrinsic Amplitude: 5.125 mV
Lead Channel Setting Pacing Amplitude: 3.5 V
Lead Channel Setting Pacing Amplitude: 3.5 V
Lead Channel Setting Pacing Pulse Width: 0.4 ms
Lead Channel Setting Sensing Sensitivity: 1.2 mV

## 2020-01-18 NOTE — Patient Instructions (Addendum)
Medication Instructions:  none *If you need a refill on your cardiac medications before your next appointment, please call your pharmacy*   Lab Work: none If you have labs (blood work) drawn today and your tests are completely normal, you will receive your results only by: Marland Kitchen MyChart Message (if you have MyChart) OR . A paper copy in the mail If you have any lab test that is abnormal or we need to change your treatment, we will call you to review the results.   Testing/Procedures: none   Follow-Up: At [facility], you and your health needs are our priority.  As part of our continuing mission to provide you with exceptional heart care, we have created designated Provider Care Teams.  These Care Teams include your primary Cardiologist (physician) and Advanced Practice Providers (APPs -  Physician Assistants and Nurse Practitioners) who all work together to provide you with the care you need, when you need it.  We recommend signing up for the patient portal called "MyChart".  Sign up information is provided on this After Visit Summary.  MyChart is used to connect with patients for Virtual Visits (Telemedicine).  Patients are able to view lab/test results, encounter notes, upcoming appointments, etc.  Non-urgent messages can be sent to your provider as well.   To learn more about what you can do with MyChart, go to [email or web address].      Other Instructions Remote monitoring is used to monitor your Pacemaker  from home. This monitoring reduces the number of office visits required to check your device to one time per year. It allows [place] to keep an eye on the functioning of your device to ensure it is working properly. You are scheduled for a device check from home on 03/14/20. You may send your transmission at any time that day. If you have a wireless device, the transmission will be sent automatically. After your physician reviews your transmission, you will receive a postcard with your  next transmission date.

## 2020-01-19 ENCOUNTER — Other Ambulatory Visit (HOSPITAL_COMMUNITY): Payer: Self-pay | Admitting: Nurse Practitioner

## 2020-01-28 ENCOUNTER — Other Ambulatory Visit: Payer: Self-pay

## 2020-01-28 ENCOUNTER — Encounter (HOSPITAL_COMMUNITY): Payer: Self-pay | Admitting: Nurse Practitioner

## 2020-01-28 ENCOUNTER — Ambulatory Visit (HOSPITAL_COMMUNITY)
Admission: RE | Admit: 2020-01-28 | Discharge: 2020-01-28 | Disposition: A | Discharge status: Home / Self Care | Payer: Medicare Other | Source: Ambulatory Visit | Attending: Nurse Practitioner | Admitting: Nurse Practitioner

## 2020-01-28 VITALS — BP 186/90 | HR 73 | Ht 64.0 in | Wt 158.6 lb

## 2020-01-28 DIAGNOSIS — Z7984 Long term (current) use of oral hypoglycemic drugs: Secondary | ICD-10-CM | POA: Insufficient documentation

## 2020-01-28 DIAGNOSIS — I10 Essential (primary) hypertension: Secondary | ICD-10-CM | POA: Insufficient documentation

## 2020-01-28 DIAGNOSIS — M1990 Unspecified osteoarthritis, unspecified site: Secondary | ICD-10-CM | POA: Diagnosis not present

## 2020-01-28 DIAGNOSIS — Z950 Presence of cardiac pacemaker: Secondary | ICD-10-CM | POA: Diagnosis not present

## 2020-01-28 DIAGNOSIS — I4891 Unspecified atrial fibrillation: Secondary | ICD-10-CM | POA: Diagnosis not present

## 2020-01-28 DIAGNOSIS — Z96653 Presence of artificial knee joint, bilateral: Secondary | ICD-10-CM | POA: Diagnosis not present

## 2020-01-28 DIAGNOSIS — I480 Paroxysmal atrial fibrillation: Secondary | ICD-10-CM

## 2020-01-28 DIAGNOSIS — E7800 Pure hypercholesterolemia, unspecified: Secondary | ICD-10-CM | POA: Diagnosis not present

## 2020-01-28 DIAGNOSIS — Z833 Family history of diabetes mellitus: Secondary | ICD-10-CM | POA: Insufficient documentation

## 2020-01-28 DIAGNOSIS — Z79899 Other long term (current) drug therapy: Secondary | ICD-10-CM | POA: Diagnosis not present

## 2020-01-28 DIAGNOSIS — I429 Cardiomyopathy, unspecified: Secondary | ICD-10-CM | POA: Diagnosis not present

## 2020-01-28 DIAGNOSIS — E119 Type 2 diabetes mellitus without complications: Secondary | ICD-10-CM | POA: Diagnosis not present

## 2020-01-28 DIAGNOSIS — D6869 Other thrombophilia: Secondary | ICD-10-CM

## 2020-01-28 DIAGNOSIS — Z79890 Hormone replacement therapy (postmenopausal): Secondary | ICD-10-CM | POA: Diagnosis not present

## 2020-01-28 DIAGNOSIS — I4892 Unspecified atrial flutter: Secondary | ICD-10-CM | POA: Insufficient documentation

## 2020-01-28 DIAGNOSIS — Z91013 Allergy to seafood: Secondary | ICD-10-CM | POA: Diagnosis not present

## 2020-01-28 DIAGNOSIS — J449 Chronic obstructive pulmonary disease, unspecified: Secondary | ICD-10-CM | POA: Insufficient documentation

## 2020-01-28 DIAGNOSIS — H409 Unspecified glaucoma: Secondary | ICD-10-CM | POA: Diagnosis not present

## 2020-01-28 DIAGNOSIS — Z7901 Long term (current) use of anticoagulants: Secondary | ICD-10-CM | POA: Diagnosis not present

## 2020-01-28 MED ORDER — AMIODARONE HCL 200 MG PO TABS: 200.0000 mg | ORAL_TABLET | Freq: Every day | ORAL | 1 refills | Status: DC

## 2020-01-28 NOTE — Progress Notes (Addendum)
Date:  01/28/2020   ID:  Lydia Myers, DOB 1941/04/13, MRN [id number]  Location: home  Provider location: [address] [place], [place] [address] Evaluation Performed:  Follow up   PCP:  Kelton Pillar, MD  Primary Cardiologist:  none  Primary Electrophysiologist:Dr. Allred  CC: afib/flutter s/p PPM, AV nodal ablation   History of Present Illness: Lydia Myers is a 79 y.o. female who presents for f/u of permanent afib today.  . She reports today that her HR runs mostly in the 70's. BP elevated this am, had a hard time getting here with the heavy rain. Rechecked 150/80.Marland KitchenShe is not aware of any spells of heart racing. She feels well. She does not have any pedal edema, takes lasix 3x a week.  No  bleeding issues with xarelto.  F/u in afib clinic, 01/27/20.when I last saw her she presented with a HR of 200 bpm, sent to ER and was admitted, failed  atrial flutter ablation,  loaded on amiodarone that  also failed to control v rates and ultimately ended up with a PPM/ AV nodal ablation. On d/c with new hypotension, diltiazem was stopped, low dose BB started, Ace stopped and olmesartan was continued. Labs drawn by PCP after discharge showed improvement in creatinine to 1.53 and wnl K, at time of discharge creatinine was 1.79 and K+ of 5.3. Had labs with PCP yesterday as well and will request results. Pt feels much improved. She got anxious on arrival as she could not get the gate code to work with a BP of 186/90 but on recheck 140/90. She  has been on amiodarone 200 mg bid and after discussion with Dr. Lovena Le will be brought  down to 200 mg daily  Today, she denies symptoms of palpitations, chest pain, shortness of breath, orthopnea, PND, lower extremity edema, claudication, dizziness, presyncope, syncope, bleeding, or neurologic sequela. The patient is tolerating medications without difficulties and is otherwise without complaint today.   she denies symptoms of cough, fevers, chills, or  new SOB worrisome for COVID 19.     Past Medical History:  Diagnosis Date  . Arthritis   . Asthma   . Cardiomyopathy ([place])   . COPD (chronic obstructive pulmonary disease) ([place])   . Diabetes mellitus   . Glaucoma   . High cholesterol   . Hypertension   . Systolic dysfunction, left ventricle    Past Surgical History:  Procedure Laterality Date  . A-FLUTTER ABLATION N/A 12/09/2019   Procedure: A-FLUTTER ABLATION;  Surgeon: Evans Lance, MD;  Location: [facility];  Service: Cardiovascular;  Laterality: N/A;  . ABDOMINAL HYSTERECTOMY    . AV NODE ABLATION N/A 12/14/2019   Procedure: AV NODE ABLATION;  Surgeon: Evans Lance, MD;  Location: [facility];  Service: Cardiovascular;  Laterality: N/A;  . HIP SURGERY    . JOINT REPLACEMENT    . left lumpectomy    . PACEMAKER IMPLANT N/A 12/14/2019   Procedure: PACEMAKER IMPLANT;  Surgeon: Evans Lance, MD;  Location: [facility];  Service: Cardiovascular;  Laterality: N/A;  . REPLACEMENT TOTAL KNEE BILATERAL     2009-Right, 2001-Left  . Right bog toe fusion    . Right trigger finger surgery       Current Outpatient Medications  Medication Sig Dispense Refill  . acetaminophen (TYLENOL) 325 MG tablet Take 1-2 tablets (325-650 mg total) by mouth every 4 (four) hours as needed for mild pain.    Marland Kitchen  amiodarone (PACERONE) 200 MG tablet Take 1 tablet (200 mg total) by mouth 2 (two) times daily. 60 tablet 6  . atorvastatin (LIPITOR) 10 MG tablet TAKE 1 TABLET BY MOUTH EVERY DAY AT 6PM 90 tablet 0  . AZOPT 1 % ophthalmic suspension Place 1 drop into both eyes 2 (two) times daily.  0  . furosemide (LASIX) 20 MG tablet TAKE 1 TABLET BY MOUTH EVERY 3 DAYS 15 tablet 6  . JANUVIA 100 MG tablet Take 100 mg by mouth daily.  0  . levothyroxine (SYNTHROID, LEVOTHROID) 25 MCG tablet Take 1 tablet (25 mcg total) by mouth daily before breakfast. 30 tablet 0  . metoprolol succinate (TOPROL XL) 25 MG 24 hr tablet Take 1 tablet (25 mg  total) by mouth daily. 30 tablet 11  . olmesartan (BENICAR) 40 MG tablet Take 40 mg by mouth daily.    . potassium chloride (KLOR-CON) 10 MEQ tablet TAKE 1 TABLET BY MOUTH ON DAYS YOU TAKE LASIX 15 tablet 2  . Rivaroxaban (XARELTO) 15 MG TABS tablet TAKE 1 TABLET BY MOUTH EVERY DAY WITH DINNER. RESUME Thursday, 2/11 30 tablet 6  . TRAVATAN Z 0.004 % SOLN ophthalmic solution Place 1 drop into both eyes every evening.  0   No current facility-administered medications for this encounter.    Allergies:   Shrimp [shellfish allergy]   Social History:  The patient  reports that she has never smoked. She has never used smokeless tobacco. She reports that she does not drink alcohol or use drugs.   Family History:  The patient's  family history includes Diabetes in her brother.    ROS: GEN- The patient is well appearing, alert and oriented x 3 today.   Head- normocephalic, atraumatic Eyes-  Sclera clear, conjunctiva pink Ears- hearing intact Oropharynx- clear Lungs- Clear to ausculation bilaterally, normal work of breathing Heart- Regular rate and rhythm, no murmurs, rubs or gallops, PMI not laterally displaced GI- soft, NT, ND, + BS Extremities- no clubbing, cyanosis, or edema  EKG-Sinus rhythm at 92 bpm, PR int 168 ms, qrs int 76 ms, qtc 469 ms  Exam: N/A  Recent Labs: 12/09/2019: TSH 2.335 12/11/2019: Platelets 348 12/13/2019: ALT 33; Hemoglobin 13.6; Magnesium 1.8 12/15/2019: BUN 27; Creatinine, Ser 1.79; Potassium 5.3; Sodium 135  personally reviewed      ASSESSMENT AND PLAN:  1. Afib/flutter Admitted 2/2- 2/9 for rapid atrial flutter  refractory to ablation and antiarrythmic's S/p PPM/AV nodal  ablation  Seen by Oda Kilts, PA in the device clinic, 3/15 with normal device function She feels well at her current baseline Will lower amiodarone to 200 mg daily   This patients CHA2DS2-VASc Score and unadjusted Ischemic Stroke Rate (% per year) is equal to 7.2 % stroke rate/year  from a score of 5  Above score calculated as 1 point each if present [CHF, HTN, DM, Vascular=MI/PAD/Aortic Plaque, Age if 65-74, or Female] Above score calculated as 2 points each if present [Age > 75, or Stroke/TIA/TE]  2.HTN Elevated today, better on recheck BP is stable at home  COVID screen The patient does not have any symptoms that suggest any further testing/ screening at this time.  Social distancing reinforced today. Has had one shot, pending second shot soon   Follow-up:  6 months Dr. Lovena Le 03/16/20  Current medicines are reviewed at length with the patient today.   The patient does not have concerns regarding her medicines.  The following changes were made today: amiodarone dose reduced  Labs/ tests ordered today include: none No orders of the defined types were placed in this encounter.   Signed, Roderic Palau NP  01/28/2020 8:36 AM  [facility] [address] [place], [place] [address] [phone]

## 2020-01-28 NOTE — Patient Instructions (Signed)
Decrease amiodarone to 200mg once a day 

## 2020-02-17 ENCOUNTER — Telehealth (HOSPITAL_COMMUNITY): Payer: Self-pay | Admitting: *Deleted

## 2020-02-17 NOTE — Telephone Encounter (Signed)
Patient called in stating she has been feeling dizzy when going from a sitting to standing position for the last week. It comes and goes but she just feels "wobbly." Pt has not checked her vitals but does endorse her weight is stable. When questioned she "maybe" dehydrated. Pt BP while on the phone was 128/77 HR 72. She is going to ensure she is not dehydrated, take her BP when she is having an episode and call back on Friday with report. In agreement.

## 2020-02-19 NOTE — Telephone Encounter (Signed)
Patient called with update. She was feeling better but still some dizziness when going from laying to standing. BP 122/77 HR 78. Pt wonders if majority of her issue is she is not sleeping at night due to anxiety and fear. Encouraged her to follow up with her PCP regarding possible medication to help with her anxiety so she can rest. Pt verbalized understanding.

## 2020-03-14 ENCOUNTER — Ambulatory Visit (INDEPENDENT_AMBULATORY_CARE_PROVIDER_SITE_OTHER): Payer: Medicare Other | Admitting: *Deleted

## 2020-03-14 DIAGNOSIS — I4891 Unspecified atrial fibrillation: Secondary | ICD-10-CM

## 2020-03-14 LAB — CUP PACEART REMOTE DEVICE CHECK
Battery Remaining Longevity: 84 mo
Battery Voltage: 3.13 V
Brady Statistic AP VP Percent: 97.44 %
Brady Statistic AP VS Percent: 0.01 %
Brady Statistic AS VP Percent: 2.53 %
Brady Statistic AS VS Percent: 0.02 %
Brady Statistic RA Percent Paced: 97.11 %
Brady Statistic RV Percent Paced: 99.97 %
Date Time Interrogation Session: 20210510032620
Implantable Lead Implant Date: 20210208
Implantable Lead Implant Date: 20210208
Implantable Lead Location: 753859
Implantable Lead Location: 753860
Implantable Lead Model: 3830
Implantable Lead Model: 5076
Implantable Pulse Generator Implant Date: 20210208
Lead Channel Impedance Value: 361 Ohm
Lead Channel Impedance Value: 361 Ohm
Lead Channel Impedance Value: 475 Ohm
Lead Channel Impedance Value: 532 Ohm
Lead Channel Pacing Threshold Amplitude: 0.75 V
Lead Channel Pacing Threshold Pulse Width: 0.4 ms
Lead Channel Sensing Intrinsic Amplitude: 3.125 mV
Lead Channel Sensing Intrinsic Amplitude: 3.125 mV
Lead Channel Sensing Intrinsic Amplitude: 5.125 mV
Lead Channel Setting Pacing Amplitude: 3.25 V
Lead Channel Setting Pacing Amplitude: 3.5 V
Lead Channel Setting Pacing Pulse Width: 0.4 ms
Lead Channel Setting Sensing Sensitivity: 1.2 mV

## 2020-03-15 NOTE — Progress Notes (Signed)
Remote pacemaker transmission.   

## 2020-03-16 ENCOUNTER — Other Ambulatory Visit: Payer: Self-pay

## 2020-03-16 ENCOUNTER — Ambulatory Visit: Payer: Medicare Other | Admitting: Internal Medicine

## 2020-03-16 ENCOUNTER — Encounter: Payer: Self-pay | Admitting: Internal Medicine

## 2020-03-16 DIAGNOSIS — I495 Sick sinus syndrome: Secondary | ICD-10-CM | POA: Diagnosis not present

## 2020-03-16 DIAGNOSIS — Z950 Presence of cardiac pacemaker: Secondary | ICD-10-CM | POA: Insufficient documentation

## 2020-03-16 MED ORDER — FUROSEMIDE 20 MG PO TABS: ORAL_TABLET | ORAL | 3 refills | Status: DC

## 2020-03-16 MED ORDER — AMIODARONE HCL 200 MG PO TABS: ORAL_TABLET | ORAL | 3 refills | Status: DC

## 2020-03-16 NOTE — Progress Notes (Signed)
HPI [name] returns today for followup. She is a pleasant 79 yo woman with CHB, s/p PPM insertion, who underwent DDD PM insertion several months ago due to symptomatic heart block. She has done well in the interim but notes sob. She admits to sodium indiscretion. No syncope. No palpitations. At the time of her PPM insertion because of uncontrolled atrial fib/flutter, she underwent AV node ablation.  Allergies  Allergen Reactions  . Shrimp [Shellfish Allergy] Nausea And Vomiting     Current Outpatient Medications  Medication Sig Dispense Refill  . acetaminophen (TYLENOL) 325 MG tablet Take 1-2 tablets (325-650 mg total) by mouth every 4 (four) hours as needed for mild pain.    Marland Kitchen atorvastatin (LIPITOR) 10 MG tablet TAKE 1 TABLET BY MOUTH EVERY DAY AT 6PM 90 tablet 0  . AZOPT 1 % ophthalmic suspension Place 1 drop into both eyes 2 (two) times daily.  0  . JANUVIA 100 MG tablet Take 100 mg by mouth daily.  0  . levothyroxine (SYNTHROID) 75 MCG tablet Take 75 mcg by mouth every morning.    . metoprolol succinate (TOPROL XL) 25 MG 24 hr tablet Take 1 tablet (25 mg total) by mouth daily. 30 tablet 11  . olmesartan (BENICAR) 40 MG tablet Take 40 mg by mouth daily.    . potassium chloride (KLOR-CON) 10 MEQ tablet TAKE 1 TABLET BY MOUTH ON DAYS YOU TAKE LASIX 15 tablet 2  . Rivaroxaban (XARELTO) 15 MG TABS tablet TAKE 1 TABLET BY MOUTH EVERY DAY WITH DINNER. RESUME Thursday, 2/11 30 tablet 6  . TRAVATAN Z 0.004 % SOLN ophthalmic solution Place 1 drop into both eyes every evening.  0  . amiodarone (PACERONE) 200 MG tablet Take one tablet by mouth daily Monday through Saturday.  Do NOT take on Sunday. 90 tablet 3  . furosemide (LASIX) 20 MG tablet Take one tablet by mouth every other day. 90 tablet 3   No current facility-administered medications for this visit.     Past Medical History:  Diagnosis Date  . Arthritis   . Asthma   . Cardiomyopathy ([facility])   . COPD (chronic obstructive  pulmonary disease) ([facility])   . Diabetes mellitus   . Glaucoma   . High cholesterol   . Hypertension   . Systolic dysfunction, left ventricle     ROS:   All systems reviewed and negative except as noted in the HPI.   Past Surgical History:  Procedure Laterality Date  . A-FLUTTER ABLATION N/A 12/09/2019   Procedure: A-FLUTTER ABLATION;  Surgeon: ,  W, MD;  [facility]: [facility];  Service: Cardiovascular;  Laterality: N/A;  . ABDOMINAL HYSTERECTOMY    . AV NODE ABLATION N/A 12/14/2019   Procedure: AV NODE ABLATION;  Surgeon: ,  W, MD;  Location: [facility];  Service: Cardiovascular;  Laterality: N/A;  . HIP SURGERY    . JOINT REPLACEMENT    . left lumpectomy    . PACEMAKER IMPLANT N/A 12/14/2019   Procedure: PACEMAKER IMPLANT;  Surgeon: ,  W, MD;  Location: [facility];  Service: Cardiovascular;  Laterality: N/A;  . REPLACEMENT TOTAL KNEE BILATERAL     20 09-Right, 2001-Left  . Right bog toe fusion    . Right trigger finger surgery       Family History  Problem Relation Age of Onset  . Diabetes Brother      Social History   Socioeconomic History  . Marital status:  Single    Spouse name: Not on file  . Number of children: Not on file  . Years of education: Not on file  . Highest education level: Not on file  Occupational History  . Not on file  Tobacco Use  . Smoking status: Never Smoker  . Smokeless tobacco: Never Used  Substance and Sexual Activity  . Alcohol use: No  . Drug use: No  . Sexual activity: Never  Other Topics Concern  . Not on file  Social History Narrative  . Not on file   Social Determinants of Health   Financial Resource Strain:   . Difficulty of Paying Living Expenses:   Food Insecurity:   . Worried About Charity fundraiser in the Last Year:   . Arboriculturist in the Last Year:   Transportation Needs:   . Film/video editor (Medical):   Marland Kitchen Lack of Transportation (Non-Medical):     Physical Activity:   . Days of Exercise per Week:   . Minutes of Exercise per Session:   Stress:   . Feeling of Stress :   Social Connections:   . Frequency of Communication with Friends and Family:   . Frequency of Social Gatherings with Friends and Family:   . Attends Religious Services:   . Active Member of Clubs or Organizations:   . Attends Archivist Meetings:   Marland Kitchen Marital Status:   Intimate Partner Violence:   . Fear of Current or Ex-Partner:   . Emotionally Abused:   Marland Kitchen Physically Abused:   . Sexually Abused:      BP (!) 144/92   Pulse 87   Ht 5\' 4"  (1.626 m)   Wt 169 lb 12.8 oz (77 kg)   SpO2 96%   BMI 29.15 kg/m   Physical Exam:  Well appearing NAD HEENT: Unremarkable Neck:  No JVD, no thyromegally Lymphatics:  No adenopathy Back:  No CVA tenderness Lungs:  Clear with no wheezes HEART:  Regular rate rhythm, no murmurs, no rubs, no clicks Abd:  soft, positive bowel sounds, no organomegally, no rebound, no guarding Ext:  2 plus pulses, no edema, no cyanosis, no clubbing Skin:  No rashes no nodules Neuro:  CN II through XII intact, motor grossly intact  EKG - atrial fib with ventricular pacing, QRS 120.  DEVICE  Normal device function.  See PaceArt for details.   1. Atrial fib - her rates are now well controlled. I would reduce her dose of beta blocker now that her rates are controlled but she has elevated bp. 2. HTN - her SBP is up and she will continue her current meds.  3. Chronic diastolic heart failure - her weights are stable. She admits to sodium indiscretion and I have encouraged her to avoid salty foods.  4. PPM - her medtronic DDD PM is working normally. We will follow. Recheck in several months.  Cristopher Peru, MD Assess/Plan:

## 2020-03-16 NOTE — Patient Instructions (Addendum)
Medication Instructions:  Your physician has recommended you make the following change in your medication:  1.  Reduce your furosemide (Lasix) 20 mg - Take one tablet by mouth EVERY OTHER DAY  2.  Decrease your amiodarone 200 mg-  Take one tablet by mouth daily Monday through Saturday.  Do NOT take on Sunday.  REDUCE your salt intake  Labwork: None ordered.  Testing/Procedures: None ordered.  Follow-Up: Your physician wants you to follow-up in: one year with Dr. Lovena Le.   You will receive a reminder letter in the mail two months in advance. If you don't receive a letter, please call our office to schedule the follow-up appointment.  Remote monitoring is used to monitor your Pacemaker from home. This monitoring reduces the number of office visits required to check your device to one time per year. It allows [place] to keep an eye on the functioning of your device to ensure it is working properly. You are scheduled for a device check from home on 06/13/2020. You may send your transmission at any time that day. If you have a wireless device, the transmission will be sent automatically. After your physician reviews your transmission, you will receive a postcard with your next transmission date.  Any Other Special Instructions Will Be Listed Below (If Applicable).  If you need a refill on your cardiac medications before your next appointment, please call your pharmacy.    DASH Eating Plan DASH stands for "Dietary Approaches to Stop Hypertension." The DASH eating plan is a healthy eating plan that has been shown to reduce high blood pressure (hypertension). It may also reduce your risk for type 2 diabetes, heart disease, and stroke. The DASH eating plan may also help with weight loss. What are tips for following this plan?  General guidelines  Avoid eating more than 2,300 mg (milligrams) of salt (sodium) a day. If you have hypertension, you may need to reduce your sodium intake to 1,500 mg a  day.  Limit alcohol intake to no more than 1 drink a day for nonpregnant women and 2 drinks a day for men. One drink equals 12 oz of beer, 5 oz of wine, or 1 oz of hard liquor.  Work with your health care provider to maintain a healthy body weight or to lose weight. Ask what an ideal weight is for you.  Get at least 30 minutes of exercise that causes your heart to beat faster (aerobic exercise) most days of the week. Activities may include walking, swimming, or biking.  Work with your health care provider or diet and nutrition specialist (dietitian) to adjust your eating plan to your individual calorie needs. Reading food labels   Check food labels for the amount of sodium per serving. Choose foods with less than 5 percent of the Daily Value of sodium. Generally, foods with less than 300 mg of sodium per serving fit into this eating plan.  To find whole grains, look for the word "whole" as the first word in the ingredient list. Shopping  Buy products labeled as "low-sodium" or "no salt added."  Buy fresh foods. Avoid canned foods and premade or frozen meals. Cooking  Avoid adding salt when cooking. Use salt-free seasonings or herbs instead of table salt or sea salt. Check with your health care provider or pharmacist before using salt substitutes.  Do not fry foods. Cook foods using healthy methods such as baking, boiling, grilling, and broiling instead.  Cook with heart-healthy oils, such as olive, canola, soybean, or sunflower  oil. Meal planning  Eat a balanced diet that includes: ? 5 or more servings of fruits and vegetables each day. At each meal, try to fill half of your plate with fruits and vegetables. ? Up to 6-8 servings of whole grains each day. ? Less than 6 oz of lean meat, poultry, or fish each day. A 3-oz serving of meat is about the same size as a deck of cards. One egg equals 1 oz. ? 2 servings of low-fat dairy each day. ? A serving of nuts, seeds, or beans 5 times  each week. ? Heart-healthy fats. Healthy fats called Omega-3 fatty acids are found in foods such as flaxseeds and coldwater fish, like sardines, salmon, and mackerel.  Limit how much you eat of the following: ? Canned or prepackaged foods. ? Food that is high in trans fat, such as fried foods. ? Food that is high in saturated fat, such as fatty meat. ? Sweets, desserts, sugary drinks, and other foods with added sugar. ? Full-fat dairy products.  Do not salt foods before eating.  Try to eat at least 2 vegetarian meals each week.  Eat more home-cooked food and less restaurant, buffet, and fast food.  When eating at a restaurant, ask that your food be prepared with less salt or no salt, if possible. What foods are recommended? The items listed may not be a complete list. Talk with your dietitian about what dietary choices are best for you. Grains Whole-grain or whole-wheat bread. Whole-grain or whole-wheat pasta. Brown rice. Modena Morrow. Bulgur. Whole-grain and low-sodium cereals. Pita bread. Low-fat, low-sodium crackers. Whole-wheat flour tortillas. Vegetables Fresh or frozen vegetables (raw, steamed, roasted, or grilled). Low-sodium or reduced-sodium tomato and vegetable juice. Low-sodium or reduced-sodium tomato sauce and tomato paste. Low-sodium or reduced-sodium canned vegetables. Fruits All fresh, dried, or frozen fruit. Canned fruit in natural juice (without added sugar). Meat and other protein foods Skinless chicken or [place]. Ground chicken or [place]. Pork with fat trimmed off. Fish and seafood. Egg whites. Dried beans, peas, or lentils. Unsalted nuts, nut butters, and seeds. Unsalted canned beans. Lean cuts of beef with fat trimmed off. Low-sodium, lean deli meat. Dairy Low-fat (1%) or fat-free (skim) milk. Fat-free, low-fat, or reduced-fat cheeses. Nonfat, low-sodium ricotta or cottage cheese. Low-fat or nonfat yogurt. Low-fat, low-sodium cheese. Fats and oils Soft margarine  without trans fats. Vegetable oil. Low-fat, reduced-fat, or light mayonnaise and salad dressings (reduced-sodium). Canola, safflower, olive, soybean, and sunflower oils. Avocado. Seasoning and other foods Herbs. Spices. Seasoning mixes without salt. Unsalted popcorn and pretzels. Fat-free sweets. What foods are not recommended? The items listed may not be a complete list. Talk with your dietitian about what dietary choices are best for you. Grains Baked goods made with fat, such as croissants, muffins, or some breads. Dry pasta or rice meal packs. Vegetables Creamed or fried vegetables. Vegetables in a cheese sauce. Regular canned vegetables (not low-sodium or reduced-sodium). Regular canned tomato sauce and paste (not low-sodium or reduced-sodium). Regular tomato and vegetable juice (not low-sodium or reduced-sodium). Angie Fava. Olives. Fruits Canned fruit in a light or heavy syrup. Fried fruit. Fruit in cream or butter sauce. Meat and other protein foods Fatty cuts of meat. Ribs. Fried meat. Berniece Salines. Sausage. Bologna and other processed lunch meats. Salami. Fatback. Hotdogs. Bratwurst. Salted nuts and seeds. Canned beans with added salt. Canned or smoked fish. Whole eggs or egg yolks. Chicken or [place] with skin. Dairy Whole or 2% milk, cream, and half-and-half. Whole or full-fat cream cheese.  Whole-fat or sweetened yogurt. Full-fat cheese. Nondairy creamers. Whipped toppings. Processed cheese and cheese spreads. Fats and oils Butter. Stick margarine. Lard. Shortening. Ghee. Bacon fat. Tropical oils, such as coconut, palm kernel, or palm oil. Seasoning and other foods Salted popcorn and pretzels. Onion salt, garlic salt, seasoned salt, table salt, and sea salt. Worcestershire sauce. Tartar sauce. Barbecue sauce. Teriyaki sauce. Soy sauce, including reduced-sodium. Steak sauce. Canned and packaged gravies. Fish sauce. Oyster sauce. Cocktail sauce. Horseradish that you find on the shelf. Ketchup.  Mustard. Meat flavorings and tenderizers. Bouillon cubes. Hot sauce and Tabasco sauce. Premade or packaged marinades. Premade or packaged taco seasonings. Relishes. Regular salad dressings. Where to find more information:  National Heart, Lung, and [place]: https://wilson-eaton.com/  American Heart Association: www.heart.org Summary  The DASH eating plan is a healthy eating plan that has been shown to reduce high blood pressure (hypertension). It may also reduce your risk for type 2 diabetes, heart disease, and stroke.  With the DASH eating plan, you should limit salt (sodium) intake to 2,300 mg a day. If you have hypertension, you may need to reduce your sodium intake to 1,500 mg a day.  When on the DASH eating plan, aim to eat more fresh fruits and vegetables, whole grains, lean proteins, low-fat dairy, and heart-healthy fats.  Work with your health care provider or diet and nutrition specialist (dietitian) to adjust your eating plan to your individual calorie needs. This information is not intended to replace advice given to you by your health care provider. Make sure you discuss any questions you have with your health care provider. Document Revised: 10/04/2017 Document Reviewed: 10/15/2016 Elsevier Patient Education  2020 [employer].

## 2020-06-13 ENCOUNTER — Ambulatory Visit (INDEPENDENT_AMBULATORY_CARE_PROVIDER_SITE_OTHER): Payer: Medicare Other | Admitting: *Deleted

## 2020-06-13 DIAGNOSIS — I495 Sick sinus syndrome: Secondary | ICD-10-CM | POA: Diagnosis not present

## 2020-06-13 LAB — CUP PACEART REMOTE DEVICE CHECK
Battery Remaining Longevity: 126 mo
Battery Voltage: 3.09 V
Brady Statistic AP VP Percent: 85.43 %
Brady Statistic AP VS Percent: 0 %
Brady Statistic AS VP Percent: 14.44 %
Brady Statistic AS VS Percent: 0.13 %
Brady Statistic RA Percent Paced: 85.2 %
Brady Statistic RV Percent Paced: 99.87 %
Date Time Interrogation Session: 20210808191532
Implantable Lead Implant Date: 20210208
Implantable Lead Implant Date: 20210208
Implantable Lead Location: 753859
Implantable Lead Location: 753860
Implantable Lead Model: 3830
Implantable Lead Model: 5076
Implantable Pulse Generator Implant Date: 20210208
Lead Channel Impedance Value: 342 Ohm
Lead Channel Impedance Value: 361 Ohm
Lead Channel Impedance Value: 494 Ohm
Lead Channel Impedance Value: 513 Ohm
Lead Channel Pacing Threshold Amplitude: 0.75 V
Lead Channel Pacing Threshold Amplitude: 0.875 V
Lead Channel Pacing Threshold Pulse Width: 0.4 ms
Lead Channel Pacing Threshold Pulse Width: 0.4 ms
Lead Channel Sensing Intrinsic Amplitude: 2.125 mV
Lead Channel Sensing Intrinsic Amplitude: 2.125 mV
Lead Channel Sensing Intrinsic Amplitude: 5.125 mV
Lead Channel Setting Pacing Amplitude: 1.5 V
Lead Channel Setting Pacing Amplitude: 2.5 V
Lead Channel Setting Pacing Pulse Width: 0.4 ms
Lead Channel Setting Sensing Sensitivity: 1.2 mV

## 2020-06-14 NOTE — Progress Notes (Signed)
Remote pacemaker transmission.   

## 2020-06-20 ENCOUNTER — Telehealth (HOSPITAL_COMMUNITY): Payer: Self-pay | Admitting: *Deleted

## 2020-06-20 NOTE — Telephone Encounter (Signed)
Patient called in with complaint of increased LLE and shortness of breath. Pt states she sleeps on several pillows a night. Pt states she sits at the kitchen table majority of the day working on puzzles she does not have a scale. Pt states her swelling and shortness of breath have progressed for the last several weeks. Discussed with Adline Peals PA - will increase lasix to 40mg  today and 20mg  tomorrow then call with update of symptoms.

## 2020-06-21 NOTE — Telephone Encounter (Signed)
Pt called with update - good response to increased dose of lasix yesterday - she is going to take 20mg  of lasix today and tomorrow then call Thursday with update. She thinks a major component of her shortness of breath is anxiety - she has an appt with PCP tomorrow encouraged her to discuss further at that visit.

## 2020-06-24 NOTE — Telephone Encounter (Signed)
Patient feeling much improved. Swelling better. Her PCP did prescribe xanax as needed for anxiety. Creatinine on labs at PCP office 8/18 resulted at 1.99 (up from previous result) therefore Adline Peals PA recommends pt only use lasix 20mg  every other day -- weigh daily - if weight gain can take extra 20mg  of lasix. Educated again on importance of low sodium diet. Pt verbalized understanding she will call if further issues arise.

## 2020-07-05 ENCOUNTER — Telehealth (HOSPITAL_COMMUNITY): Payer: Self-pay | Admitting: *Deleted

## 2020-07-05 NOTE — Telephone Encounter (Signed)
Patient continues with issues of lower extremity swelling. She has a scale but doesn't have it "setup" yet. Feels like her legs are going to bust. Discussed with Lydia Palau NP will have her take lasix 20mg  a day for next 3 days. To follow up later this week for reassessment and checking kidney function. Pt in agreement.

## 2020-07-07 ENCOUNTER — Encounter (HOSPITAL_COMMUNITY): Payer: Self-pay | Admitting: Nurse Practitioner

## 2020-07-07 ENCOUNTER — Ambulatory Visit (HOSPITAL_COMMUNITY)
Admission: RE | Admit: 2020-07-07 | Discharge: 2020-07-07 | Disposition: A | Discharge status: Home / Self Care | Payer: Medicare Other | Source: Ambulatory Visit | Attending: Nurse Practitioner | Admitting: Nurse Practitioner

## 2020-07-07 ENCOUNTER — Other Ambulatory Visit: Payer: Self-pay

## 2020-07-07 VITALS — BP 186/88 | HR 67 | Ht 64.0 in | Wt 180.0 lb

## 2020-07-07 DIAGNOSIS — Z79890 Hormone replacement therapy (postmenopausal): Secondary | ICD-10-CM | POA: Diagnosis not present

## 2020-07-07 DIAGNOSIS — I4891 Unspecified atrial fibrillation: Secondary | ICD-10-CM

## 2020-07-07 DIAGNOSIS — Z7901 Long term (current) use of anticoagulants: Secondary | ICD-10-CM | POA: Diagnosis not present

## 2020-07-07 DIAGNOSIS — H42 Glaucoma in diseases classified elsewhere: Secondary | ICD-10-CM | POA: Insufficient documentation

## 2020-07-07 DIAGNOSIS — I4819 Other persistent atrial fibrillation: Secondary | ICD-10-CM

## 2020-07-07 DIAGNOSIS — Z96653 Presence of artificial knee joint, bilateral: Secondary | ICD-10-CM | POA: Diagnosis not present

## 2020-07-07 DIAGNOSIS — I4821 Permanent atrial fibrillation: Secondary | ICD-10-CM | POA: Insufficient documentation

## 2020-07-07 DIAGNOSIS — I129 Hypertensive chronic kidney disease with stage 1 through stage 4 chronic kidney disease, or unspecified chronic kidney disease: Secondary | ICD-10-CM | POA: Diagnosis not present

## 2020-07-07 DIAGNOSIS — E1122 Type 2 diabetes mellitus with diabetic chronic kidney disease: Secondary | ICD-10-CM | POA: Diagnosis not present

## 2020-07-07 DIAGNOSIS — E7800 Pure hypercholesterolemia, unspecified: Secondary | ICD-10-CM | POA: Diagnosis not present

## 2020-07-07 DIAGNOSIS — E1139 Type 2 diabetes mellitus with other diabetic ophthalmic complication: Secondary | ICD-10-CM | POA: Diagnosis not present

## 2020-07-07 DIAGNOSIS — Z950 Presence of cardiac pacemaker: Secondary | ICD-10-CM | POA: Insufficient documentation

## 2020-07-07 DIAGNOSIS — Z7984 Long term (current) use of oral hypoglycemic drugs: Secondary | ICD-10-CM | POA: Insufficient documentation

## 2020-07-07 DIAGNOSIS — Z833 Family history of diabetes mellitus: Secondary | ICD-10-CM | POA: Insufficient documentation

## 2020-07-07 DIAGNOSIS — H409 Unspecified glaucoma: Secondary | ICD-10-CM | POA: Insufficient documentation

## 2020-07-07 DIAGNOSIS — I429 Cardiomyopathy, unspecified: Secondary | ICD-10-CM | POA: Insufficient documentation

## 2020-07-07 DIAGNOSIS — R600 Localized edema: Secondary | ICD-10-CM | POA: Diagnosis not present

## 2020-07-07 DIAGNOSIS — J449 Chronic obstructive pulmonary disease, unspecified: Secondary | ICD-10-CM | POA: Insufficient documentation

## 2020-07-07 DIAGNOSIS — I4892 Unspecified atrial flutter: Secondary | ICD-10-CM | POA: Insufficient documentation

## 2020-07-07 DIAGNOSIS — Z79899 Other long term (current) drug therapy: Secondary | ICD-10-CM | POA: Diagnosis not present

## 2020-07-07 DIAGNOSIS — M1990 Unspecified osteoarthritis, unspecified site: Secondary | ICD-10-CM | POA: Diagnosis not present

## 2020-07-07 DIAGNOSIS — N189 Chronic kidney disease, unspecified: Secondary | ICD-10-CM | POA: Insufficient documentation

## 2020-07-07 LAB — BASIC METABOLIC PANEL
Anion gap: 11 (ref 5–15)
BUN: 30 mg/dL — ABNORMAL HIGH (ref 8–23)
CO2: 24 mmol/L (ref 22–32)
Calcium: 9.4 mg/dL (ref 8.9–10.3)
Chloride: 103 mmol/L (ref 98–111)
Creatinine, Ser: 2.12 mg/dL — ABNORMAL HIGH (ref 0.44–1.00)
GFR calc Af Amer: 25 mL/min — ABNORMAL LOW (ref >60)
GFR calc non Af Amer: 21 mL/min — ABNORMAL LOW (ref >60)
Glucose, Bld: 154 mg/dL — ABNORMAL HIGH (ref 70–99)
Potassium: 5.4 mmol/L — ABNORMAL HIGH (ref 3.5–5.1)
Sodium: 138 mmol/L (ref 135–145)

## 2020-07-07 LAB — CBC
HCT: 40.2 % (ref 36.0–46.0)
Hemoglobin: 12.1 g/dL (ref 12.0–15.0)
MCH: 28 pg (ref 26.0–34.0)
MCHC: 30.1 g/dL (ref 30.0–36.0)
MCV: 93.1 fL (ref 80.0–100.0)
Platelets: 272 10*3/uL (ref 150–400)
RBC: 4.32 MIL/uL (ref 3.87–5.11)
RDW: 13.4 % (ref 11.5–15.5)
WBC: 6.8 10*3/uL (ref 4.0–10.5)
nRBC: 0 % (ref 0.0–0.2)

## 2020-07-07 MED ORDER — RIVAROXABAN 15 MG PO TABS: ORAL_TABLET | ORAL | 3 refills | Status: DC

## 2020-07-07 NOTE — Patient Instructions (Signed)

## 2020-07-07 NOTE — Progress Notes (Signed)
Date:  07/07/2020   ID:  Lydia Myers, DOB [birth year], MRN [id number]  Location: home  Provider location: [address] [place], [place] [address] Evaluation Performed:  Follow up   PCP:  Kelton Pillar, MD  Primary Cardiologist:  none  Primary Electrophysiologist:Dr. Lovena Le   CC: afib/flutter s/p PPM, AV nodal ablation New increase in LLE, shortness of breath     History of Present Illness: Lydia Myers is a 79 y.o. female who presents for f/u of permanent afib today.  . She reports today that her HR runs mostly in the 70's. BP elevated this am, had a hard time getting here with the heavy rain. Rechecked 150/80.Marland KitchenShe is not aware of any spells of heart racing. She feels well. She does not have any pedal edema, takes lasix 3x a week.  No  bleeding issues with xarelto.  F/u in afib clinic, 01/27/20.when I last saw her she presented with a HR of 200 bpm, sent to ER and was admitted, failed  atrial flutter ablation,  loaded on amiodarone that  also failed to control v rates and ultimately ended up with a PPM/ AV nodal ablation. On d/c with new hypotension, diltiazem was stopped, low dose BB started, Ace stopped and olmesartan was continued. Labs drawn by PCP after discharge showed improvement in creatinine to 1.53 and wnl K, at time of discharge creatinine was 1.79 and K+ of 5.3. Had labs with PCP yesterday as well and will request results. Pt feels much improved. She got anxious on arrival as she could not get the gate code to work with a BP of 186/90 but on recheck 140/90. She  has been on amiodarone 200 mg bid and after discussion with Dr. Lovena Le will be brought  down to 200 mg daily.  F/u in afib clinic, 07/07/20. She is here as she has felt more short of breath and has more LLE swelling. She saw her PCP 8/18 and had Cmet,TSH, lipid, HG A1c. Her last creatinine was 1.9.   She called the first of the week with swelling and was instructed to take 20 mg daily. She usually takes 20 mg  qod. She has also felt more short of breath. Her BP Is elevated at 189/68, recheck 180/ 60. She has not taken her olmesartan this am. She has a wrist cuff at home but rarely checks her BP. Her diet is high in salt, she uses the salt shaker, eats sausage, hot dogs.   Today, she denies symptoms of palpitations, chest pain, shortness of breath, orthopnea, PND, lower extremity edema, claudication, dizziness, presyncope, syncope, bleeding, or neurologic sequela. The patient is tolerating medications without difficulties and is otherwise without complaint today.   she denies symptoms of cough, fevers, chills, or new SOB worrisome for COVID 19.     Past Medical History:  Diagnosis Date  . Arthritis   . Asthma   . Cardiomyopathy ([facility])   . COPD (chronic obstructive pulmonary disease) ([facility])   . Diabetes mellitus   . Glaucoma   . High cholesterol   . Hypertension   . Systolic dysfunction, left ventricle    Past Surgical History:  Procedure Laterality Date  . A-FLUTTER ABLATION N/A 12/09/2019   Procedure: A-FLUTTER ABLATION;  Surgeon: Evans Lance, MD;  Location: [facility];  Service: Cardiovascular;  Laterality: N/A;  . ABDOMINAL HYSTERECTOMY    . AV NODE ABLATION N/A 12/14/2019   Procedure: AV NODE ABLATION;  Surgeon: Cristopher Peru  W, MD;  Location: [facility];  Service: Cardiovascular;  Laterality: N/A;  . HIP SURGERY    . JOINT REPLACEMENT    . left lumpectomy    . PACEMAKER IMPLANT N/A 12/14/2019   Procedure: PACEMAKER IMPLANT;  Surgeon: Evans Lance, MD;  Location: [facility];  Service: Cardiovascular;  Laterality: N/A;  . REPLACEMENT TOTAL KNEE BILATERAL     2009-Right, 2001-Left  . Right bog toe fusion    . Right trigger finger surgery       Current Outpatient Medications  Medication Sig Dispense Refill  . acetaminophen (TYLENOL) 325 MG tablet Take 1-2 tablets (325-650 mg total) by mouth every 4 (four) hours as needed for mild pain.    Marland Kitchen ALPRAZolam (XANAX)  0.25 MG tablet Take 0.25 mg by mouth daily as needed.    Marland Kitchen amiodarone (PACERONE) 200 MG tablet Take one tablet by mouth daily Monday through Saturday.  Do NOT take on Sunday. 90 tablet 3  . atorvastatin (LIPITOR) 10 MG tablet TAKE 1 TABLET BY MOUTH EVERY DAY AT 6PM 90 tablet 0  . AZOPT 1 % ophthalmic suspension Place 1 drop into both eyes 2 (two) times daily.  0  . furosemide (LASIX) 20 MG tablet Take one tablet by mouth every other day. 90 tablet 3  . JANUVIA 100 MG tablet Take 100 mg by mouth daily.  0  . levothyroxine (SYNTHROID) 75 MCG tablet Take 75 mcg by mouth every morning.    . metoprolol succinate (TOPROL XL) 25 MG 24 hr tablet Take 1 tablet (25 mg total) by mouth daily. 30 tablet 11  . olmesartan (BENICAR) 40 MG tablet Take 40 mg by mouth daily.    . potassium chloride (KLOR-CON) 10 MEQ tablet TAKE 1 TABLET BY MOUTH ON DAYS YOU TAKE LASIX 15 tablet 2  . Rivaroxaban (XARELTO) 15 MG TABS tablet TAKE 1 TABLET BY MOUTH EVERY DAY WITH DINNER. RESUME Thursday, 2/11 30 tablet 6  . TRAVATAN Z 0.004 % SOLN ophthalmic solution Place 1 drop into both eyes every evening.  0   No current facility-administered medications for this encounter.    Allergies:   Shrimp [shellfish allergy]   Social History:  The patient  reports that she has never smoked. She has never used smokeless tobacco. She reports that she does not drink alcohol and does not use drugs.   Family History:  The patient's  family history includes Diabetes in her brother.    ROS: GEN- The patient is well appearing, alert and oriented x 3 today.   Head- normocephalic, atraumatic Eyes-  Sclera clear, conjunctiva pink Ears- hearing intact Oropharynx- clear Lungs- Clear to ausculation bilaterally, normal work of breathing Heart- Regular rate and rhythm, no murmurs, rubs or gallops, PMI not laterally displaced GI- soft, NT, ND, + BS Extremities- no clubbing, cyanosis, or  Trace edema  EKG- AV dual paced at 67 bpm, pr int 214  ms, qrs int 122 ms, qtc 494 ms   Exam: N/A  Recent Labs: 12/09/2019: TSH 2.335 12/11/2019: Platelets 348 12/13/2019: ALT 33; Hemoglobin 13.6; Magnesium 1.8 12/15/2019: BUN 27; Creatinine, Ser 1.79; Potassium 5.3; Sodium 135  personally reviewed      ASSESSMENT AND PLAN:  1. Afib/flutter Admitted 2/2- 2/9 for rapid atrial flutter  refractory to ablation and antiarrythmic's S/p PPM/AV nodal  ablation  On amiodarone at 200 mg daily In SR today, has not noted any irregular heart rhythm  This patients CHA2DS2-VASc Score and unadjusted Ischemic Stroke  Rate (% per year) is equal to 7.2 % stroke rate/year from a score of 5  Continue  xarelto 15 mg daily Cbc/bmet today   2.HTN Elevated today Has not taken am olmesartan Monitor at home and if stays elevated may need additional BP meds  3. LLE/dyspnea/CKD Acute on chronic for the last 2 weeks  Lasix 20 mg for the last 2 days has helped Bmet will  help guide lasix dosage going forward  Reduce  salt in the diet Knee high support/sock/hose suggested  Echo   Follow-up:  afib clinic  9/28   Current medicines are reviewed at length with the patient today.   The patient does not have concerns regarding her medicines.  The following changes were made today: none   Labs/ tests ordered today include: none Orders Placed This Encounter  Procedures  . TSH  . Comprehensive metabolic panel  . EKG 12-Lead    Signed, Roderic Palau NP  07/07/2020 8:46 AM  [facility] [address] [place], [place] [address] [phone]

## 2020-07-12 ENCOUNTER — Other Ambulatory Visit (HOSPITAL_COMMUNITY): Payer: Medicare Other | Admitting: Nurse Practitioner

## 2020-07-12 ENCOUNTER — Ambulatory Visit (HOSPITAL_COMMUNITY): Admission: RE | Admit: 2020-07-12 | Payer: Medicare Other | Source: Ambulatory Visit

## 2020-07-21 ENCOUNTER — Other Ambulatory Visit: Payer: Self-pay

## 2020-07-21 ENCOUNTER — Ambulatory Visit (HOSPITAL_COMMUNITY)
Admission: RE | Admit: 2020-07-21 | Discharge: 2020-07-21 | Disposition: A | Discharge status: Home / Self Care | Payer: Medicare Other | Source: Ambulatory Visit | Attending: Nurse Practitioner | Admitting: Nurse Practitioner

## 2020-07-21 DIAGNOSIS — I4819 Other persistent atrial fibrillation: Secondary | ICD-10-CM

## 2020-07-21 DIAGNOSIS — Z950 Presence of cardiac pacemaker: Secondary | ICD-10-CM | POA: Insufficient documentation

## 2020-07-21 DIAGNOSIS — I4891 Unspecified atrial fibrillation: Secondary | ICD-10-CM | POA: Insufficient documentation

## 2020-07-21 LAB — BASIC METABOLIC PANEL
Anion gap: 9 (ref 5–15)
BUN: 29 mg/dL — ABNORMAL HIGH (ref 8–23)
CO2: 24 mmol/L (ref 22–32)
Calcium: 9.2 mg/dL (ref 8.9–10.3)
Chloride: 104 mmol/L (ref 98–111)
Creatinine, Ser: 2.06 mg/dL — ABNORMAL HIGH (ref 0.44–1.00)
GFR calc Af Amer: 26 mL/min — ABNORMAL LOW (ref >60)
GFR calc non Af Amer: 22 mL/min — ABNORMAL LOW (ref >60)
Glucose, Bld: 110 mg/dL — ABNORMAL HIGH (ref 70–99)
Potassium: 5.5 mmol/L — ABNORMAL HIGH (ref 3.5–5.1)
Sodium: 137 mmol/L (ref 135–145)

## 2020-07-21 LAB — ECHOCARDIOGRAM COMPLETE
Area-P 1/2: 2.83 cm2
S' Lateral: 2.7 cm

## 2020-07-21 NOTE — Progress Notes (Signed)
  Echocardiogram 2D Echocardiogram has been performed.  Lydia Myers 07/21/2020, 2:07 PM

## 2020-07-22 ENCOUNTER — Other Ambulatory Visit (HOSPITAL_COMMUNITY): Payer: Self-pay | Admitting: *Deleted

## 2020-07-27 ENCOUNTER — Other Ambulatory Visit (HOSPITAL_COMMUNITY): Payer: Self-pay | Admitting: *Deleted

## 2020-07-27 DIAGNOSIS — N184 Chronic kidney disease, stage 4 (severe): Secondary | ICD-10-CM

## 2020-08-02 ENCOUNTER — Encounter (HOSPITAL_COMMUNITY): Payer: Self-pay | Admitting: Nurse Practitioner

## 2020-08-02 ENCOUNTER — Ambulatory Visit (HOSPITAL_COMMUNITY)
Admission: RE | Admit: 2020-08-02 | Discharge: 2020-08-02 | Disposition: A | Discharge status: Home / Self Care | Payer: Medicare Other | Source: Ambulatory Visit | Attending: Nurse Practitioner | Admitting: Nurse Practitioner

## 2020-08-02 ENCOUNTER — Other Ambulatory Visit: Payer: Self-pay

## 2020-08-02 VITALS — BP 170/86 | HR 61 | Ht 64.0 in | Wt 191.6 lb

## 2020-08-02 DIAGNOSIS — E7800 Pure hypercholesterolemia, unspecified: Secondary | ICD-10-CM | POA: Diagnosis not present

## 2020-08-02 DIAGNOSIS — I4891 Unspecified atrial fibrillation: Secondary | ICD-10-CM | POA: Insufficient documentation

## 2020-08-02 DIAGNOSIS — N189 Chronic kidney disease, unspecified: Secondary | ICD-10-CM | POA: Insufficient documentation

## 2020-08-02 DIAGNOSIS — I129 Hypertensive chronic kidney disease with stage 1 through stage 4 chronic kidney disease, or unspecified chronic kidney disease: Secondary | ICD-10-CM | POA: Diagnosis not present

## 2020-08-02 DIAGNOSIS — D6869 Other thrombophilia: Secondary | ICD-10-CM | POA: Diagnosis not present

## 2020-08-02 DIAGNOSIS — I4892 Unspecified atrial flutter: Secondary | ICD-10-CM | POA: Insufficient documentation

## 2020-08-02 DIAGNOSIS — E1122 Type 2 diabetes mellitus with diabetic chronic kidney disease: Secondary | ICD-10-CM | POA: Diagnosis not present

## 2020-08-02 DIAGNOSIS — Z833 Family history of diabetes mellitus: Secondary | ICD-10-CM | POA: Diagnosis not present

## 2020-08-02 DIAGNOSIS — Z79899 Other long term (current) drug therapy: Secondary | ICD-10-CM | POA: Diagnosis not present

## 2020-08-02 DIAGNOSIS — R0600 Dyspnea, unspecified: Secondary | ICD-10-CM | POA: Diagnosis not present

## 2020-08-02 DIAGNOSIS — N184 Chronic kidney disease, stage 4 (severe): Secondary | ICD-10-CM

## 2020-08-02 DIAGNOSIS — Z950 Presence of cardiac pacemaker: Secondary | ICD-10-CM | POA: Insufficient documentation

## 2020-08-02 DIAGNOSIS — Z7984 Long term (current) use of oral hypoglycemic drugs: Secondary | ICD-10-CM | POA: Diagnosis not present

## 2020-08-02 DIAGNOSIS — J449 Chronic obstructive pulmonary disease, unspecified: Secondary | ICD-10-CM | POA: Diagnosis not present

## 2020-08-02 DIAGNOSIS — Z7901 Long term (current) use of anticoagulants: Secondary | ICD-10-CM | POA: Insufficient documentation

## 2020-08-02 DIAGNOSIS — Z79890 Hormone replacement therapy (postmenopausal): Secondary | ICD-10-CM | POA: Diagnosis not present

## 2020-08-02 DIAGNOSIS — H409 Unspecified glaucoma: Secondary | ICD-10-CM | POA: Diagnosis not present

## 2020-08-02 DIAGNOSIS — I429 Cardiomyopathy, unspecified: Secondary | ICD-10-CM | POA: Diagnosis not present

## 2020-08-02 LAB — COMPREHENSIVE METABOLIC PANEL
ALT: 12 U/L (ref 0–44)
AST: 16 U/L (ref 15–41)
Albumin: 3.8 g/dL (ref 3.5–5.0)
Alkaline Phosphatase: 81 U/L (ref 38–126)
Anion gap: 11 (ref 5–15)
BUN: 30 mg/dL — ABNORMAL HIGH (ref 8–23)
CO2: 26 mmol/L (ref 22–32)
Calcium: 9.6 mg/dL (ref 8.9–10.3)
Chloride: 102 mmol/L (ref 98–111)
Creatinine, Ser: 2.04 mg/dL — ABNORMAL HIGH (ref 0.44–1.00)
GFR calc Af Amer: 26 mL/min — ABNORMAL LOW (ref >60)
GFR calc non Af Amer: 22 mL/min — ABNORMAL LOW (ref >60)
Glucose, Bld: 147 mg/dL — ABNORMAL HIGH (ref 70–99)
Potassium: 5.6 mmol/L — ABNORMAL HIGH (ref 3.5–5.1)
Sodium: 139 mmol/L (ref 135–145)
Total Bilirubin: 0.7 mg/dL (ref 0.3–1.2)
Total Protein: 7.1 g/dL (ref 6.5–8.1)

## 2020-08-02 LAB — TSH: TSH: 4.05 u[IU]/mL (ref 0.350–4.500)

## 2020-08-02 NOTE — Progress Notes (Addendum)
Date:  08/02/2020   ID:  Lydia Myers, DOB 1941/03/24, MRN [id number]  Location: home  Provider location: [address] [place], [place] [address] Evaluation Performed:  Follow up   PCP:  Kelton Pillar, MD  Primary Cardiologist:  none  Primary Electrophysiologist:Dr. Lovena Le   CC: afib/flutter s/p PPM, AV nodal ablation New increase in LLE, shortness of breath     History of Present Illness: Lydia Myers is a 79 y.o. female who presents for f/u of permanent afib today.  . She reports today that her HR runs mostly in the 70's. BP elevated this am, had a hard time getting here with the heavy rain. Rechecked 150/80.Marland KitchenShe is not aware of any spells of heart racing. She feels well. She does not have any pedal edema, takes lasix 3x a week.  No  bleeding issues with xarelto.  F/u in afib clinic, 01/27/20.when I last saw her she presented with a HR of 200 bpm, sent to ER and was admitted, failed  atrial flutter ablation,  loaded on amiodarone that  also failed to control v rates and ultimately ended up with a PPM/ AV nodal ablation. On d/c with new hypotension, diltiazem was stopped, low dose BB started, Ace stopped and olmesartan was continued. Labs drawn by PCP after discharge showed improvement in creatinine to 1.53 and wnl K, at time of discharge creatinine was 1.79 and K+ of 5.3. Had labs with PCP yesterday as well and will request results. Pt feels much improved. She got anxious on arrival as she could not get the gate code to work with a BP of 186/90 but on recheck 140/90. She  has been on amiodarone 200 mg bid and after discussion with Dr. Lovena Le will be brought  down to 200 mg daily.  F/u in afib clinic, 07/07/20. She is here as she has felt more short of breath and has more LLE swelling. She saw her PCP 8/18 and had Cmet,TSH, lipid, HG A1c. Her last creatinine was 1.9.   She called the first of the week with swelling and was instructed to take 20 mg daily. She usually takes 20 mg  qod. She has also felt more short of breath. Her BP Is elevated at 189/68, recheck 180/ 60. She has not taken her olmesartan this am. She has a wrist cuff at home but rarely checks her BP. Her diet is high in salt, she uses the salt shaker, eats sausage, hot dogs.   F/u in afib office, 08/02/20. She had an echo since I last saw her with normal LV function. Last bmet showed increase of creatinine after I tried to gently increase lasix for LLE x one week. Her K+ was elevated so he was asked to stop her K+ supplement. She called yesterday and said her legs were very swollen and I advised her to take her full lasix dose yesterday. I feel her LLE is from salt  indiscretion and her CKD. I have referred her to nephrology. Her weight is up 12 lbs and she is c/o of shortness of breath. On last visit her K+ supplement was stopped for hyperkalemia, labs pending today.  Today, she denies symptoms of palpitations, chest pain, + for shortness of breath, orthopnea, PND,+ lower extremity edema, claudication, dizziness, presyncope, syncope, bleeding, or neurologic sequela.+fatigue. The patient is tolerating medications without difficulties and is otherwise without complaint today.   she denies symptoms of cough, fevers, chills, or new SOB worrisome for COVID 19.  Past Medical History:  Diagnosis Date  . Arthritis   . Asthma   . Cardiomyopathy ([facility])   . COPD (chronic obstructive pulmonary disease) ([facility])   . Diabetes mellitus   . Glaucoma   . High cholesterol   . Hypertension   . Systolic dysfunction, left ventricle    Past Surgical History:  Procedure Laterality Date  . A-FLUTTER ABLATION N/A 12/09/2019   Procedure: A-FLUTTER ABLATION;  Surgeon: Evans Lance, MD;  Location: [facility];  Service: Cardiovascular;  Laterality: N/A;  . ABDOMINAL HYSTERECTOMY    . AV NODE ABLATION N/A 12/14/2019   Procedure: AV NODE ABLATION;  Surgeon: Evans Lance, MD;  Location: [facility];  Service:  Cardiovascular;  Laterality: N/A;  . HIP SURGERY    . JOINT REPLACEMENT    . left lumpectomy    . PACEMAKER IMPLANT N/A 12/14/2019   Procedure: PACEMAKER IMPLANT;  Surgeon: Evans Lance, MD;  Location: [facility];  Service: Cardiovascular;  Laterality: N/A;  . REPLACEMENT TOTAL KNEE BILATERAL     2009-Right, 2001-Left  . Right bog toe fusion    . Right trigger finger surgery       Current Outpatient Medications  Medication Sig Dispense Refill  . acetaminophen (TYLENOL) 325 MG tablet Take 1-2 tablets (325-650 mg total) by mouth every 4 (four) hours as needed for mild pain.    Marland Kitchen ALPRAZolam (XANAX) 0.25 MG tablet Take 0.25 mg by mouth daily as needed. (Patient not taking: Reported on 07/07/2020)    . amiodarone (PACERONE) 200 MG tablet Take one tablet by mouth daily Monday through Saturday.  Do NOT take on Sunday. 90 tablet 3  . atorvastatin (LIPITOR) 10 MG tablet TAKE 1 TABLET BY MOUTH EVERY DAY AT 6PM 90 tablet 0  . AZOPT 1 % ophthalmic suspension Place 1 drop into both eyes 2 (two) times daily.  0  . furosemide (LASIX) 20 MG tablet Take one tablet by mouth every other day. 90 tablet 3  . JANUVIA 100 MG tablet Take 100 mg by mouth daily.  0  . levothyroxine (SYNTHROID) 75 MCG tablet Take 75 mcg by mouth every morning.    . metoprolol succinate (TOPROL XL) 25 MG 24 hr tablet Take 1 tablet (25 mg total) by mouth daily. 30 tablet 11  . olmesartan (BENICAR) 40 MG tablet Take 40 mg by mouth daily.    . Rivaroxaban (XARELTO) 15 MG TABS tablet TAKE 1 TABLET BY MOUTH EVERY DAY WITH DINNER 90 tablet 3  . TRAVATAN Z 0.004 % SOLN ophthalmic solution Place 1 drop into both eyes every evening.  0   No current facility-administered medications for this encounter.    Allergies:   Shrimp [shellfish allergy]   Social History:  The patient  reports that she has never smoked. She has never used smokeless tobacco. She reports that she does not drink alcohol and does not use drugs.   Family  History:  The patient's  family history includes Diabetes in her brother.    ROS: GEN- The patient is well appearing, alert and oriented x 3 today.   Head- normocephalic, atraumatic Eyes-  Sclera clear, conjunctiva pink Ears- hearing intact Oropharynx- clear Lungs- breath sounds are decreased bilaterally,  normal work of breathing Heart- Regular rate and rhythm, no murmurs, rubs or gallops, PMI not laterally displaced GI- soft, NT, ND, + BS Extremities- no clubbing, cyanosis, or 2+ edema bilaterally to shin area with one fluid filled blister left  ankle area  EKG- AV dual paced at 67 bpm, pr int 214 ms, qrs int 122 ms, qtc 494 ms     Recent Labs: 12/09/2019: TSH 2.335 12/13/2019: ALT 33; Magnesium 1.8 07/07/2020: Hemoglobin 12.1; Platelets 272 07/21/2020: BUN 29; Creatinine, Ser 2.06; Potassium 5.5; Sodium 137  personally reviewed   Echo- 1. Left ventricular ejection fraction, by estimation, is 60 to 65%. The left ventricle has normal function. The left ventricle has no regional wall motion abnormalities. Left ventricular diastolic parameters are indeterminate. 2. Right ventricular systolic function is normal. The right ventricular size is normal. There is mildly elevated pulmonary artery systolic pressure. 3. Left atrial size was moderately dilated. 4. Right atrial size was moderately dilated. 5. The mitral valve is normal in structure. Trivial mitral valve regurgitation. 6. The aortic valve is tricuspid. There is mild calcification of the aortic valve. Aortic valve regurgitation is not visualized. Mild aortic valve sclerosis is present, with no evidence of aortic valve stenosis. 7. The inferior vena cava is normal in size with greater than 50% respiratory variability, suggesting right atrial pressure of 3 mmHg   ASSESSMENT AND PLAN:  1. Afib/flutter Admitted 2/2- 12/15/19 for rapid atrial flutter  refractory to ablation and antiarrythmic's S/p PPM/AV nodal  ablation  On  amiodarone at 200 mg daily In SR today, has not noted any irregular heart rhythm  This patients CHA2DS2-VASc Score and unadjusted Ischemic Stroke Rate (% per year) is equal to 7.2 % stroke rate/year from a score of 5  Continue  xarelto 15 mg daily Tsh/cnmet today   2.HTN Elevated today Has not taken am olmesartan  3. LLE/dyspnea/CKD Recent echo showed normal EF with normal systolic function  Acute on chronic for the last several weeks with rise of creatinine  Up 12 lbs, recent increase in diuretic helped some, but with decline in kidney function, crcl cal is around 29 I have referred to nephrology, appointment this pm  Hopefully, they can help management fluid with CKD Reduce  salt in the diet Knee high support/sock/hose suggested  CXR  today    Current medicines are reviewed at length with the patient today.   The patient does not have concerns regarding her medicines.  The following changes were made today: none   Labs/ tests ordered today include: none Orders Placed This Encounter  Procedures  . Comprehensive metabolic panel  . TSH  . EKG 12-Lead    Signed, Roderic Palau NP  08/02/2020 8:58 AM  [facility] [address] [place], [place] [address] [phone]

## 2020-08-11 ENCOUNTER — Other Ambulatory Visit: Payer: Self-pay | Admitting: Nephrology

## 2020-08-11 DIAGNOSIS — N179 Acute kidney failure, unspecified: Secondary | ICD-10-CM

## 2020-08-11 DIAGNOSIS — N184 Chronic kidney disease, stage 4 (severe): Secondary | ICD-10-CM

## 2020-08-13 ENCOUNTER — Ambulatory Visit (HOSPITAL_COMMUNITY)
Admission: EM | Admit: 2020-08-13 | Discharge: 2020-08-13 | Disposition: A | Discharge status: Home / Self Care | Payer: Medicare Other | Attending: Family Medicine | Admitting: Family Medicine

## 2020-08-13 ENCOUNTER — Ambulatory Visit (INDEPENDENT_AMBULATORY_CARE_PROVIDER_SITE_OTHER): Payer: Medicare Other

## 2020-08-13 ENCOUNTER — Ambulatory Visit (HOSPITAL_COMMUNITY): Payer: Medicare Other

## 2020-08-13 ENCOUNTER — Other Ambulatory Visit: Payer: Self-pay

## 2020-08-13 DIAGNOSIS — M79672 Pain in left foot: Secondary | ICD-10-CM

## 2020-08-13 DIAGNOSIS — R7981 Abnormal blood-gas level: Secondary | ICD-10-CM

## 2020-08-13 NOTE — Discharge Instructions (Addendum)
Please proceed to the ED should you begin to feel short of breath or begin feeling pain in your chest.

## 2020-08-13 NOTE — ED Triage Notes (Signed)
Pt present left foot/toe injury. She was getting out of bed 3 days ago when she heard a pop while trying to get out of bed.

## 2020-08-15 NOTE — ED Provider Notes (Signed)
[facility]   [id number] 08/13/20 Arrival Time: 3335  ASSESSMENT & PLAN:  1. Foot pain, left   2. Low oxygen saturation     Declines any workup of low O2 saturation including CXR. No resp distress.  I have personally viewed the imaging studies ordered this visit. No foot fx appreciated.  Tylenol as needed.   Recommend:  Follow-up Information    Kelton Pillar, MD.   Specialty: Family Medicine Why: As needed. Contact information: [address] [place] [address] [phone]                Reviewed expectations re: course of current medical issues. Questions answered. Outlined signs and symptoms indicating need for more acute intervention. Patient verbalized understanding. After Visit Summary given.  SUBJECTIVE: History from: patient. Lydia Myers is a 79 y.o. female who reports pain of left distal foot. "Heard a pop when I was getting out of bed a few days ago". Painful since. Able to bear weight with pain. Minimal swelling. No extremity sensation changes or weakness. No OTC tx.  Low O2 sat noted. Reports h/o and declines evaluation for this.   Past Surgical History:  Procedure Laterality Date  . A-FLUTTER ABLATION N/A 12/09/2019   Procedure: A-FLUTTER ABLATION;  Surgeon: Evans Lance, MD;  Location: [facility];  Service: Cardiovascular;  Laterality: N/A;  . ABDOMINAL HYSTERECTOMY    . AV NODE ABLATION N/A 12/14/2019   Procedure: AV NODE ABLATION;  Surgeon: Evans Lance, MD;  Location: [facility];  Service: Cardiovascular;  Laterality: N/A;  . HIP SURGERY    . JOINT REPLACEMENT    . left lumpectomy    . PACEMAKER IMPLANT N/A 12/14/2019   Procedure: PACEMAKER IMPLANT;  Surgeon: Evans Lance, MD;  Location: [facility];  Service: Cardiovascular;  Laterality: N/A;  . REPLACEMENT TOTAL KNEE BILATERAL     2009-Right, 2001-Left  . Right bog toe fusion    . Right trigger finger surgery         OBJECTIVE:  Vitals:   08/13/20 1102  BP: (!) 137/56  Pulse: 77  Resp: 16  Temp: 98.1 F (36.7 C)  TempSrc: Oral  SpO2: (!) 89%    General appearance: alert; no distress HEENT: [place]; AT Neck: supple with FROM Resp: unlabored respirations Extremities: . LLE: warm with well perfused appearance; poorly localized mild to moderate tenderness over left distal foot; without gross deformities; swelling: minimal; bruising: none; ankle ROM: normal Skin: warm and dry; no visible rashes Neurologic: normal sensation and strength of LLE Psychological: alert and cooperative; normal mood and affect  Imaging: DG Foot Complete Left  Result Date: 08/13/2020 CLINICAL DATA:  Left foot pain for 1 week.  Unable to bear weight. EXAM: LEFT FOOT - COMPLETE 3+ VIEW COMPARISON:  Mar 19, 2017 FINDINGS: There is no evidence of fracture or dislocation. Minimal plantar calcaneal spur is noted. IMPRESSION: No acute fracture or dislocation. Electronically Signed   By: Abelardo Diesel M.D.   On: 08/13/2020 13:51     Allergies  Allergen Reactions  . Shrimp [Shellfish Allergy] Nausea And Vomiting    Past Medical History:  Diagnosis Date  . Arthritis   . Asthma   . Cardiomyopathy ([facility])   . COPD (chronic obstructive pulmonary disease) ([facility])   . Diabetes mellitus   . Glaucoma   . High cholesterol   . Hypertension   . Systolic dysfunction, left ventricle    Social History  Socioeconomic History  . Marital status: Single    Spouse name: Not on file  . Number of children: Not on file  . Years of education: Not on file  . Highest education level: Not on file  Occupational History  . Not on file  Tobacco Use  . Smoking status: Never Smoker  . Smokeless tobacco: Never Used  Substance and Sexual Activity  . Alcohol use: No  . Drug use: No  . Sexual activity: Never  Other Topics Concern  . Not on file  Social History Narrative  . Not on file   Social Determinants of Health   Financial  Resource Strain:   . Difficulty of Paying Living Expenses: Not on file  Food Insecurity:   . Worried About Charity fundraiser in the Last Year: Not on file  . Ran Out of Food in the Last Year: Not on file  Transportation Needs:   . Lack of Transportation (Medical): Not on file  . Lack of Transportation (Non-Medical): Not on file  Physical Activity:   . Days of Exercise per Week: Not on file  . Minutes of Exercise per Session: Not on file  Stress:   . Feeling of Stress : Not on file  Social Connections:   . Frequency of Communication with Friends and Family: Not on file  . Frequency of Social Gatherings with Friends and Family: Not on file  . Attends Religious Services: Not on file  . Active Member of Clubs or Organizations: Not on file  . Attends Archivist Meetings: Not on file  . Marital Status: Not on file   Family History  Problem Relation Age of Onset  . Diabetes Brother    Past Surgical History:  Procedure Laterality Date  . A-FLUTTER ABLATION N/A 12/09/2019   Procedure: A-FLUTTER ABLATION;  Surgeon: Evans Lance, MD;  Location: [facility];  Service: Cardiovascular;  Laterality: N/A;  . ABDOMINAL HYSTERECTOMY    . AV NODE ABLATION N/A 12/14/2019   Procedure: AV NODE ABLATION;  Surgeon: Evans Lance, MD;  Location: [facility];  Service: Cardiovascular;  Laterality: N/A;  . HIP SURGERY    . JOINT REPLACEMENT    . left lumpectomy    . PACEMAKER IMPLANT N/A 12/14/2019   Procedure: PACEMAKER IMPLANT;  Surgeon: Evans Lance, MD;  Location: [facility];  Service: Cardiovascular;  Laterality: N/A;  . REPLACEMENT TOTAL KNEE BILATERAL     2009-Right, 2001-Left  . Right bog toe fusion    . Right trigger finger surgery        Vanessa Kick, MD 08/15/20 [phone]

## 2020-09-12 ENCOUNTER — Ambulatory Visit (INDEPENDENT_AMBULATORY_CARE_PROVIDER_SITE_OTHER): Payer: Medicare Other

## 2020-09-12 DIAGNOSIS — I4891 Unspecified atrial fibrillation: Secondary | ICD-10-CM | POA: Diagnosis not present

## 2020-09-13 LAB — CUP PACEART REMOTE DEVICE CHECK
Battery Remaining Longevity: 124 mo
Battery Voltage: 3.04 V
Brady Statistic AP VP Percent: 87.13 %
Brady Statistic AP VS Percent: 0 %
Brady Statistic AS VP Percent: 12.73 %
Brady Statistic AS VS Percent: 0.14 %
Brady Statistic RA Percent Paced: 86.98 %
Brady Statistic RV Percent Paced: 99.86 %
Date Time Interrogation Session: 20211108044137
Implantable Lead Implant Date: 20210208
Implantable Lead Implant Date: 20210208
Implantable Lead Location: 753859
Implantable Lead Location: 753860
Implantable Lead Model: 3830
Implantable Lead Model: 5076
Implantable Pulse Generator Implant Date: 20210208
Lead Channel Impedance Value: 361 Ohm
Lead Channel Impedance Value: 380 Ohm
Lead Channel Impedance Value: 513 Ohm
Lead Channel Impedance Value: 513 Ohm
Lead Channel Pacing Threshold Amplitude: 0.625 V
Lead Channel Pacing Threshold Amplitude: 0.875 V
Lead Channel Pacing Threshold Pulse Width: 0.4 ms
Lead Channel Pacing Threshold Pulse Width: 0.4 ms
Lead Channel Sensing Intrinsic Amplitude: 2.625 mV
Lead Channel Sensing Intrinsic Amplitude: 2.625 mV
Lead Channel Sensing Intrinsic Amplitude: 5.125 mV
Lead Channel Setting Pacing Amplitude: 1.5 V
Lead Channel Setting Pacing Amplitude: 2.5 V
Lead Channel Setting Pacing Pulse Width: 0.4 ms
Lead Channel Setting Sensing Sensitivity: 1.2 mV

## 2020-09-14 NOTE — Progress Notes (Signed)
Remote pacemaker transmission.   

## 2020-12-12 ENCOUNTER — Ambulatory Visit (INDEPENDENT_AMBULATORY_CARE_PROVIDER_SITE_OTHER): Payer: Medicare Other

## 2020-12-12 DIAGNOSIS — I4891 Unspecified atrial fibrillation: Secondary | ICD-10-CM | POA: Diagnosis not present

## 2020-12-14 LAB — CUP PACEART REMOTE DEVICE CHECK
Battery Remaining Longevity: 122 mo
Battery Voltage: 3.02 V
Brady Statistic AP VP Percent: 82.15 %
Brady Statistic AP VS Percent: 0 %
Brady Statistic AS VP Percent: 17.69 %
Brady Statistic AS VS Percent: 0.16 %
Brady Statistic RA Percent Paced: 81.94 %
Brady Statistic RV Percent Paced: 99.84 %
Date Time Interrogation Session: 20220206205158
Implantable Lead Implant Date: 20210208
Implantable Lead Implant Date: 20210208
Implantable Lead Location: 753859
Implantable Lead Location: 753860
Implantable Lead Model: 3830
Implantable Lead Model: 5076
Implantable Pulse Generator Implant Date: 20210208
Lead Channel Impedance Value: 361 Ohm
Lead Channel Impedance Value: 380 Ohm
Lead Channel Impedance Value: 494 Ohm
Lead Channel Impedance Value: 532 Ohm
Lead Channel Pacing Threshold Amplitude: 0.75 V
Lead Channel Pacing Threshold Amplitude: 0.875 V
Lead Channel Pacing Threshold Pulse Width: 0.4 ms
Lead Channel Pacing Threshold Pulse Width: 0.4 ms
Lead Channel Sensing Intrinsic Amplitude: 2.75 mV
Lead Channel Sensing Intrinsic Amplitude: 2.75 mV
Lead Channel Sensing Intrinsic Amplitude: 5.125 mV
Lead Channel Setting Pacing Amplitude: 1.5 V
Lead Channel Setting Pacing Amplitude: 2.5 V
Lead Channel Setting Pacing Pulse Width: 0.4 ms
Lead Channel Setting Sensing Sensitivity: 1.2 mV

## 2020-12-19 NOTE — Progress Notes (Signed)
Remote pacemaker transmission.   

## 2020-12-23 DIAGNOSIS — I429 Cardiomyopathy, unspecified: Secondary | ICD-10-CM | POA: Diagnosis not present

## 2020-12-23 DIAGNOSIS — J449 Chronic obstructive pulmonary disease, unspecified: Secondary | ICD-10-CM | POA: Diagnosis not present

## 2020-12-23 DIAGNOSIS — E1121 Type 2 diabetes mellitus with diabetic nephropathy: Secondary | ICD-10-CM | POA: Diagnosis not present

## 2020-12-23 DIAGNOSIS — I129 Hypertensive chronic kidney disease with stage 1 through stage 4 chronic kidney disease, or unspecified chronic kidney disease: Secondary | ICD-10-CM | POA: Diagnosis not present

## 2020-12-23 DIAGNOSIS — E039 Hypothyroidism, unspecified: Secondary | ICD-10-CM | POA: Diagnosis not present

## 2020-12-23 DIAGNOSIS — E7800 Pure hypercholesterolemia, unspecified: Secondary | ICD-10-CM | POA: Diagnosis not present

## 2020-12-23 DIAGNOSIS — I5032 Chronic diastolic (congestive) heart failure: Secondary | ICD-10-CM | POA: Diagnosis not present

## 2020-12-23 DIAGNOSIS — I4891 Unspecified atrial fibrillation: Secondary | ICD-10-CM | POA: Diagnosis not present

## 2020-12-23 DIAGNOSIS — N184 Chronic kidney disease, stage 4 (severe): Secondary | ICD-10-CM | POA: Diagnosis not present

## 2020-12-27 DIAGNOSIS — Z7984 Long term (current) use of oral hypoglycemic drugs: Secondary | ICD-10-CM | POA: Diagnosis not present

## 2020-12-27 DIAGNOSIS — N184 Chronic kidney disease, stage 4 (severe): Secondary | ICD-10-CM | POA: Diagnosis not present

## 2020-12-27 DIAGNOSIS — E1121 Type 2 diabetes mellitus with diabetic nephropathy: Secondary | ICD-10-CM | POA: Diagnosis not present

## 2020-12-27 DIAGNOSIS — I129 Hypertensive chronic kidney disease with stage 1 through stage 4 chronic kidney disease, or unspecified chronic kidney disease: Secondary | ICD-10-CM | POA: Diagnosis not present

## 2020-12-27 DIAGNOSIS — I5032 Chronic diastolic (congestive) heart failure: Secondary | ICD-10-CM | POA: Diagnosis not present

## 2020-12-27 DIAGNOSIS — J449 Chronic obstructive pulmonary disease, unspecified: Secondary | ICD-10-CM | POA: Diagnosis not present

## 2020-12-27 DIAGNOSIS — M6281 Muscle weakness (generalized): Secondary | ICD-10-CM | POA: Diagnosis not present

## 2020-12-27 DIAGNOSIS — I4891 Unspecified atrial fibrillation: Secondary | ICD-10-CM | POA: Diagnosis not present

## 2020-12-27 DIAGNOSIS — M160 Bilateral primary osteoarthritis of hip: Secondary | ICD-10-CM | POA: Diagnosis not present

## 2020-12-28 ENCOUNTER — Other Ambulatory Visit: Payer: Self-pay | Admitting: Nephrology

## 2020-12-28 DIAGNOSIS — N179 Acute kidney failure, unspecified: Secondary | ICD-10-CM

## 2020-12-28 DIAGNOSIS — N184 Chronic kidney disease, stage 4 (severe): Secondary | ICD-10-CM | POA: Diagnosis not present

## 2020-12-28 DIAGNOSIS — N2581 Secondary hyperparathyroidism of renal origin: Secondary | ICD-10-CM | POA: Diagnosis not present

## 2020-12-28 DIAGNOSIS — E875 Hyperkalemia: Secondary | ICD-10-CM | POA: Diagnosis not present

## 2020-12-28 DIAGNOSIS — R609 Edema, unspecified: Secondary | ICD-10-CM | POA: Diagnosis not present

## 2020-12-28 DIAGNOSIS — I429 Cardiomyopathy, unspecified: Secondary | ICD-10-CM | POA: Diagnosis not present

## 2020-12-28 DIAGNOSIS — D631 Anemia in chronic kidney disease: Secondary | ICD-10-CM | POA: Diagnosis not present

## 2020-12-29 DIAGNOSIS — I5032 Chronic diastolic (congestive) heart failure: Secondary | ICD-10-CM | POA: Diagnosis not present

## 2020-12-29 DIAGNOSIS — N184 Chronic kidney disease, stage 4 (severe): Secondary | ICD-10-CM | POA: Diagnosis not present

## 2020-12-29 DIAGNOSIS — J449 Chronic obstructive pulmonary disease, unspecified: Secondary | ICD-10-CM | POA: Diagnosis not present

## 2020-12-29 DIAGNOSIS — M6281 Muscle weakness (generalized): Secondary | ICD-10-CM | POA: Diagnosis not present

## 2020-12-29 DIAGNOSIS — I129 Hypertensive chronic kidney disease with stage 1 through stage 4 chronic kidney disease, or unspecified chronic kidney disease: Secondary | ICD-10-CM | POA: Diagnosis not present

## 2020-12-29 DIAGNOSIS — E1121 Type 2 diabetes mellitus with diabetic nephropathy: Secondary | ICD-10-CM | POA: Diagnosis not present

## 2020-12-29 DIAGNOSIS — I4891 Unspecified atrial fibrillation: Secondary | ICD-10-CM | POA: Diagnosis not present

## 2020-12-29 DIAGNOSIS — Z7984 Long term (current) use of oral hypoglycemic drugs: Secondary | ICD-10-CM | POA: Diagnosis not present

## 2020-12-29 DIAGNOSIS — M160 Bilateral primary osteoarthritis of hip: Secondary | ICD-10-CM | POA: Diagnosis not present

## 2021-01-03 DIAGNOSIS — Z7984 Long term (current) use of oral hypoglycemic drugs: Secondary | ICD-10-CM | POA: Diagnosis not present

## 2021-01-03 DIAGNOSIS — M6281 Muscle weakness (generalized): Secondary | ICD-10-CM | POA: Diagnosis not present

## 2021-01-03 DIAGNOSIS — M160 Bilateral primary osteoarthritis of hip: Secondary | ICD-10-CM | POA: Diagnosis not present

## 2021-01-03 DIAGNOSIS — E1121 Type 2 diabetes mellitus with diabetic nephropathy: Secondary | ICD-10-CM | POA: Diagnosis not present

## 2021-01-03 DIAGNOSIS — I4891 Unspecified atrial fibrillation: Secondary | ICD-10-CM | POA: Diagnosis not present

## 2021-01-03 DIAGNOSIS — J449 Chronic obstructive pulmonary disease, unspecified: Secondary | ICD-10-CM | POA: Diagnosis not present

## 2021-01-03 DIAGNOSIS — I129 Hypertensive chronic kidney disease with stage 1 through stage 4 chronic kidney disease, or unspecified chronic kidney disease: Secondary | ICD-10-CM | POA: Diagnosis not present

## 2021-01-03 DIAGNOSIS — I5032 Chronic diastolic (congestive) heart failure: Secondary | ICD-10-CM | POA: Diagnosis not present

## 2021-01-03 DIAGNOSIS — N184 Chronic kidney disease, stage 4 (severe): Secondary | ICD-10-CM | POA: Diagnosis not present

## 2021-01-04 DIAGNOSIS — E1121 Type 2 diabetes mellitus with diabetic nephropathy: Secondary | ICD-10-CM | POA: Diagnosis not present

## 2021-01-04 DIAGNOSIS — I4891 Unspecified atrial fibrillation: Secondary | ICD-10-CM | POA: Diagnosis not present

## 2021-01-04 DIAGNOSIS — Z7984 Long term (current) use of oral hypoglycemic drugs: Secondary | ICD-10-CM | POA: Diagnosis not present

## 2021-01-04 DIAGNOSIS — I5032 Chronic diastolic (congestive) heart failure: Secondary | ICD-10-CM | POA: Diagnosis not present

## 2021-01-04 DIAGNOSIS — M160 Bilateral primary osteoarthritis of hip: Secondary | ICD-10-CM | POA: Diagnosis not present

## 2021-01-04 DIAGNOSIS — N184 Chronic kidney disease, stage 4 (severe): Secondary | ICD-10-CM | POA: Diagnosis not present

## 2021-01-04 DIAGNOSIS — J449 Chronic obstructive pulmonary disease, unspecified: Secondary | ICD-10-CM | POA: Diagnosis not present

## 2021-01-04 DIAGNOSIS — M6281 Muscle weakness (generalized): Secondary | ICD-10-CM | POA: Diagnosis not present

## 2021-01-04 DIAGNOSIS — I129 Hypertensive chronic kidney disease with stage 1 through stage 4 chronic kidney disease, or unspecified chronic kidney disease: Secondary | ICD-10-CM | POA: Diagnosis not present

## 2021-01-06 DIAGNOSIS — I129 Hypertensive chronic kidney disease with stage 1 through stage 4 chronic kidney disease, or unspecified chronic kidney disease: Secondary | ICD-10-CM | POA: Diagnosis not present

## 2021-01-06 DIAGNOSIS — I4891 Unspecified atrial fibrillation: Secondary | ICD-10-CM | POA: Diagnosis not present

## 2021-01-06 DIAGNOSIS — M6281 Muscle weakness (generalized): Secondary | ICD-10-CM | POA: Diagnosis not present

## 2021-01-06 DIAGNOSIS — M160 Bilateral primary osteoarthritis of hip: Secondary | ICD-10-CM | POA: Diagnosis not present

## 2021-01-06 DIAGNOSIS — I5032 Chronic diastolic (congestive) heart failure: Secondary | ICD-10-CM | POA: Diagnosis not present

## 2021-01-06 DIAGNOSIS — J449 Chronic obstructive pulmonary disease, unspecified: Secondary | ICD-10-CM | POA: Diagnosis not present

## 2021-01-06 DIAGNOSIS — N184 Chronic kidney disease, stage 4 (severe): Secondary | ICD-10-CM | POA: Diagnosis not present

## 2021-01-06 DIAGNOSIS — E1121 Type 2 diabetes mellitus with diabetic nephropathy: Secondary | ICD-10-CM | POA: Diagnosis not present

## 2021-01-06 DIAGNOSIS — Z7984 Long term (current) use of oral hypoglycemic drugs: Secondary | ICD-10-CM | POA: Diagnosis not present

## 2021-01-09 DIAGNOSIS — I5032 Chronic diastolic (congestive) heart failure: Secondary | ICD-10-CM | POA: Diagnosis not present

## 2021-01-09 DIAGNOSIS — Z7984 Long term (current) use of oral hypoglycemic drugs: Secondary | ICD-10-CM | POA: Diagnosis not present

## 2021-01-09 DIAGNOSIS — J449 Chronic obstructive pulmonary disease, unspecified: Secondary | ICD-10-CM | POA: Diagnosis not present

## 2021-01-09 DIAGNOSIS — I4891 Unspecified atrial fibrillation: Secondary | ICD-10-CM | POA: Diagnosis not present

## 2021-01-09 DIAGNOSIS — M6281 Muscle weakness (generalized): Secondary | ICD-10-CM | POA: Diagnosis not present

## 2021-01-09 DIAGNOSIS — N184 Chronic kidney disease, stage 4 (severe): Secondary | ICD-10-CM | POA: Diagnosis not present

## 2021-01-09 DIAGNOSIS — M160 Bilateral primary osteoarthritis of hip: Secondary | ICD-10-CM | POA: Diagnosis not present

## 2021-01-09 DIAGNOSIS — E1121 Type 2 diabetes mellitus with diabetic nephropathy: Secondary | ICD-10-CM | POA: Diagnosis not present

## 2021-01-09 DIAGNOSIS — I129 Hypertensive chronic kidney disease with stage 1 through stage 4 chronic kidney disease, or unspecified chronic kidney disease: Secondary | ICD-10-CM | POA: Diagnosis not present

## 2021-01-10 DIAGNOSIS — N184 Chronic kidney disease, stage 4 (severe): Secondary | ICD-10-CM | POA: Diagnosis not present

## 2021-01-10 DIAGNOSIS — I129 Hypertensive chronic kidney disease with stage 1 through stage 4 chronic kidney disease, or unspecified chronic kidney disease: Secondary | ICD-10-CM | POA: Diagnosis not present

## 2021-01-10 DIAGNOSIS — M160 Bilateral primary osteoarthritis of hip: Secondary | ICD-10-CM | POA: Diagnosis not present

## 2021-01-10 DIAGNOSIS — E1121 Type 2 diabetes mellitus with diabetic nephropathy: Secondary | ICD-10-CM | POA: Diagnosis not present

## 2021-01-10 DIAGNOSIS — J449 Chronic obstructive pulmonary disease, unspecified: Secondary | ICD-10-CM | POA: Diagnosis not present

## 2021-01-10 DIAGNOSIS — I4891 Unspecified atrial fibrillation: Secondary | ICD-10-CM | POA: Diagnosis not present

## 2021-01-10 DIAGNOSIS — I5032 Chronic diastolic (congestive) heart failure: Secondary | ICD-10-CM | POA: Diagnosis not present

## 2021-01-10 DIAGNOSIS — M6281 Muscle weakness (generalized): Secondary | ICD-10-CM | POA: Diagnosis not present

## 2021-01-10 DIAGNOSIS — Z7984 Long term (current) use of oral hypoglycemic drugs: Secondary | ICD-10-CM | POA: Diagnosis not present

## 2021-01-12 DIAGNOSIS — I5032 Chronic diastolic (congestive) heart failure: Secondary | ICD-10-CM | POA: Diagnosis not present

## 2021-01-12 DIAGNOSIS — N184 Chronic kidney disease, stage 4 (severe): Secondary | ICD-10-CM | POA: Diagnosis not present

## 2021-01-12 DIAGNOSIS — M160 Bilateral primary osteoarthritis of hip: Secondary | ICD-10-CM | POA: Diagnosis not present

## 2021-01-12 DIAGNOSIS — I4891 Unspecified atrial fibrillation: Secondary | ICD-10-CM | POA: Diagnosis not present

## 2021-01-12 DIAGNOSIS — J449 Chronic obstructive pulmonary disease, unspecified: Secondary | ICD-10-CM | POA: Diagnosis not present

## 2021-01-12 DIAGNOSIS — M6281 Muscle weakness (generalized): Secondary | ICD-10-CM | POA: Diagnosis not present

## 2021-01-12 DIAGNOSIS — Z7984 Long term (current) use of oral hypoglycemic drugs: Secondary | ICD-10-CM | POA: Diagnosis not present

## 2021-01-12 DIAGNOSIS — E1121 Type 2 diabetes mellitus with diabetic nephropathy: Secondary | ICD-10-CM | POA: Diagnosis not present

## 2021-01-12 DIAGNOSIS — I129 Hypertensive chronic kidney disease with stage 1 through stage 4 chronic kidney disease, or unspecified chronic kidney disease: Secondary | ICD-10-CM | POA: Diagnosis not present

## 2021-01-13 DIAGNOSIS — I129 Hypertensive chronic kidney disease with stage 1 through stage 4 chronic kidney disease, or unspecified chronic kidney disease: Secondary | ICD-10-CM | POA: Diagnosis not present

## 2021-01-13 DIAGNOSIS — I4891 Unspecified atrial fibrillation: Secondary | ICD-10-CM | POA: Diagnosis not present

## 2021-01-13 DIAGNOSIS — Z7984 Long term (current) use of oral hypoglycemic drugs: Secondary | ICD-10-CM | POA: Diagnosis not present

## 2021-01-13 DIAGNOSIS — M160 Bilateral primary osteoarthritis of hip: Secondary | ICD-10-CM | POA: Diagnosis not present

## 2021-01-13 DIAGNOSIS — I5032 Chronic diastolic (congestive) heart failure: Secondary | ICD-10-CM | POA: Diagnosis not present

## 2021-01-13 DIAGNOSIS — M6281 Muscle weakness (generalized): Secondary | ICD-10-CM | POA: Diagnosis not present

## 2021-01-13 DIAGNOSIS — J449 Chronic obstructive pulmonary disease, unspecified: Secondary | ICD-10-CM | POA: Diagnosis not present

## 2021-01-13 DIAGNOSIS — N184 Chronic kidney disease, stage 4 (severe): Secondary | ICD-10-CM | POA: Diagnosis not present

## 2021-01-13 DIAGNOSIS — E1121 Type 2 diabetes mellitus with diabetic nephropathy: Secondary | ICD-10-CM | POA: Diagnosis not present

## 2021-01-16 DIAGNOSIS — I5032 Chronic diastolic (congestive) heart failure: Secondary | ICD-10-CM | POA: Diagnosis not present

## 2021-01-16 DIAGNOSIS — E1121 Type 2 diabetes mellitus with diabetic nephropathy: Secondary | ICD-10-CM | POA: Diagnosis not present

## 2021-01-16 DIAGNOSIS — M6281 Muscle weakness (generalized): Secondary | ICD-10-CM | POA: Diagnosis not present

## 2021-01-16 DIAGNOSIS — I129 Hypertensive chronic kidney disease with stage 1 through stage 4 chronic kidney disease, or unspecified chronic kidney disease: Secondary | ICD-10-CM | POA: Diagnosis not present

## 2021-01-16 DIAGNOSIS — I4891 Unspecified atrial fibrillation: Secondary | ICD-10-CM | POA: Diagnosis not present

## 2021-01-16 DIAGNOSIS — J449 Chronic obstructive pulmonary disease, unspecified: Secondary | ICD-10-CM | POA: Diagnosis not present

## 2021-01-16 DIAGNOSIS — N184 Chronic kidney disease, stage 4 (severe): Secondary | ICD-10-CM | POA: Diagnosis not present

## 2021-01-16 DIAGNOSIS — Z7984 Long term (current) use of oral hypoglycemic drugs: Secondary | ICD-10-CM | POA: Diagnosis not present

## 2021-01-16 DIAGNOSIS — M160 Bilateral primary osteoarthritis of hip: Secondary | ICD-10-CM | POA: Diagnosis not present

## 2021-01-17 DIAGNOSIS — Z7984 Long term (current) use of oral hypoglycemic drugs: Secondary | ICD-10-CM | POA: Diagnosis not present

## 2021-01-17 DIAGNOSIS — J449 Chronic obstructive pulmonary disease, unspecified: Secondary | ICD-10-CM | POA: Diagnosis not present

## 2021-01-17 DIAGNOSIS — I4891 Unspecified atrial fibrillation: Secondary | ICD-10-CM | POA: Diagnosis not present

## 2021-01-17 DIAGNOSIS — E1121 Type 2 diabetes mellitus with diabetic nephropathy: Secondary | ICD-10-CM | POA: Diagnosis not present

## 2021-01-17 DIAGNOSIS — I5032 Chronic diastolic (congestive) heart failure: Secondary | ICD-10-CM | POA: Diagnosis not present

## 2021-01-17 DIAGNOSIS — I129 Hypertensive chronic kidney disease with stage 1 through stage 4 chronic kidney disease, or unspecified chronic kidney disease: Secondary | ICD-10-CM | POA: Diagnosis not present

## 2021-01-17 DIAGNOSIS — M160 Bilateral primary osteoarthritis of hip: Secondary | ICD-10-CM | POA: Diagnosis not present

## 2021-01-17 DIAGNOSIS — N184 Chronic kidney disease, stage 4 (severe): Secondary | ICD-10-CM | POA: Diagnosis not present

## 2021-01-17 DIAGNOSIS — M6281 Muscle weakness (generalized): Secondary | ICD-10-CM | POA: Diagnosis not present

## 2021-01-24 ENCOUNTER — Ambulatory Visit
Admission: RE | Admit: 2021-01-24 | Discharge: 2021-01-24 | Disposition: A | Discharge status: Home / Self Care | Payer: Medicare Other | Source: Ambulatory Visit | Attending: Nephrology | Admitting: Nephrology

## 2021-01-24 DIAGNOSIS — N179 Acute kidney failure, unspecified: Secondary | ICD-10-CM

## 2021-02-02 DIAGNOSIS — I129 Hypertensive chronic kidney disease with stage 1 through stage 4 chronic kidney disease, or unspecified chronic kidney disease: Secondary | ICD-10-CM | POA: Diagnosis not present

## 2021-02-02 DIAGNOSIS — N184 Chronic kidney disease, stage 4 (severe): Secondary | ICD-10-CM | POA: Diagnosis not present

## 2021-02-02 DIAGNOSIS — D631 Anemia in chronic kidney disease: Secondary | ICD-10-CM | POA: Diagnosis not present

## 2021-02-02 DIAGNOSIS — R609 Edema, unspecified: Secondary | ICD-10-CM | POA: Diagnosis not present

## 2021-02-02 DIAGNOSIS — N2581 Secondary hyperparathyroidism of renal origin: Secondary | ICD-10-CM | POA: Diagnosis not present

## 2021-03-13 ENCOUNTER — Ambulatory Visit (INDEPENDENT_AMBULATORY_CARE_PROVIDER_SITE_OTHER): Payer: Medicare Other

## 2021-03-13 DIAGNOSIS — I495 Sick sinus syndrome: Secondary | ICD-10-CM | POA: Diagnosis not present

## 2021-03-14 LAB — CUP PACEART REMOTE DEVICE CHECK
Battery Remaining Longevity: 119 mo
Battery Voltage: 3.02 V
Brady Statistic AP VP Percent: 81.39 %
Brady Statistic AP VS Percent: 0 %
Brady Statistic AS VP Percent: 18.53 %
Brady Statistic AS VS Percent: 0.08 %
Brady Statistic RA Percent Paced: 81.23 %
Brady Statistic RV Percent Paced: 99.92 %
Date Time Interrogation Session: 20220509044815
Implantable Lead Implant Date: 20210208
Implantable Lead Implant Date: 20210208
Implantable Lead Location: 753859
Implantable Lead Location: 753860
Implantable Lead Model: 3830
Implantable Lead Model: 5076
Implantable Pulse Generator Implant Date: 20210208
Lead Channel Impedance Value: 361 Ohm
Lead Channel Impedance Value: 399 Ohm
Lead Channel Impedance Value: 494 Ohm
Lead Channel Impedance Value: 532 Ohm
Lead Channel Pacing Threshold Amplitude: 0.625 V
Lead Channel Pacing Threshold Amplitude: 0.875 V
Lead Channel Pacing Threshold Pulse Width: 0.4 ms
Lead Channel Pacing Threshold Pulse Width: 0.4 ms
Lead Channel Sensing Intrinsic Amplitude: 2.375 mV
Lead Channel Sensing Intrinsic Amplitude: 2.375 mV
Lead Channel Sensing Intrinsic Amplitude: 5.125 mV
Lead Channel Setting Pacing Amplitude: 1.5 V
Lead Channel Setting Pacing Amplitude: 2.5 V
Lead Channel Setting Pacing Pulse Width: 0.4 ms
Lead Channel Setting Sensing Sensitivity: 1.2 mV

## 2021-03-24 DIAGNOSIS — N184 Chronic kidney disease, stage 4 (severe): Secondary | ICD-10-CM | POA: Diagnosis not present

## 2021-03-24 DIAGNOSIS — I129 Hypertensive chronic kidney disease with stage 1 through stage 4 chronic kidney disease, or unspecified chronic kidney disease: Secondary | ICD-10-CM | POA: Diagnosis not present

## 2021-03-24 DIAGNOSIS — I429 Cardiomyopathy, unspecified: Secondary | ICD-10-CM | POA: Diagnosis not present

## 2021-03-24 DIAGNOSIS — D6869 Other thrombophilia: Secondary | ICD-10-CM | POA: Diagnosis not present

## 2021-03-24 DIAGNOSIS — I5032 Chronic diastolic (congestive) heart failure: Secondary | ICD-10-CM | POA: Diagnosis not present

## 2021-03-24 DIAGNOSIS — E039 Hypothyroidism, unspecified: Secondary | ICD-10-CM | POA: Diagnosis not present

## 2021-03-24 DIAGNOSIS — E1121 Type 2 diabetes mellitus with diabetic nephropathy: Secondary | ICD-10-CM | POA: Diagnosis not present

## 2021-03-24 DIAGNOSIS — E7800 Pure hypercholesterolemia, unspecified: Secondary | ICD-10-CM | POA: Diagnosis not present

## 2021-03-24 DIAGNOSIS — I4891 Unspecified atrial fibrillation: Secondary | ICD-10-CM | POA: Diagnosis not present

## 2021-04-04 ENCOUNTER — Encounter: Payer: Self-pay | Admitting: Internal Medicine

## 2021-04-04 ENCOUNTER — Other Ambulatory Visit: Payer: Self-pay

## 2021-04-04 ENCOUNTER — Ambulatory Visit: Payer: Medicare Other | Admitting: Internal Medicine

## 2021-04-04 VITALS — BP 136/68 | HR 70 | Ht 64.0 in | Wt 196.0 lb

## 2021-04-04 DIAGNOSIS — I10 Essential (primary) hypertension: Secondary | ICD-10-CM

## 2021-04-04 DIAGNOSIS — I519 Heart disease, unspecified: Secondary | ICD-10-CM

## 2021-04-04 DIAGNOSIS — I495 Sick sinus syndrome: Secondary | ICD-10-CM

## 2021-04-04 DIAGNOSIS — Z950 Presence of cardiac pacemaker: Secondary | ICD-10-CM

## 2021-04-04 NOTE — Progress Notes (Signed)
HPI [name] returns today for followup. She is a pleasant 80 yo woman with CHB, s/p PPM insertion, who underwent DDD PM insertion over a year ago due to symptomatic heart block. She has done well in the interim but notes sob. She admits to sodium indiscretion. No syncope. No palpitations. At the time of her PPM insertion because of uncontrolled atrial fib/flutter, she underwent AV node ablation.  Allergies  Allergen Reactions  . Shrimp [Shellfish Allergy] Nausea And Vomiting     Current Outpatient Medications  Medication Sig Dispense Refill  . acetaminophen (TYLENOL) 325 MG tablet Take 1-2 tablets (325-650 mg total) by mouth every 4 (four) hours as needed for mild pain.    Marland Kitchen ALPRAZolam (XANAX) 0.25 MG tablet Take 0.25 mg by mouth daily as needed.    Marland Kitchen amiodarone (PACERONE) 200 MG tablet Take one tablet by mouth daily Monday through Saturday.  Do NOT take on 'Sunday. 90 tablet 3  . atorvastatin (LIPITOR) 10 MG tablet TAKE 1 TABLET BY MOUTH EVERY DAY AT 6PM 90 tablet 0  . AZOPT 1 % ophthalmic suspension Place 1 drop into both eyes 2 (two) times daily.  0  . JANUVIA 100 MG tablet Take 100 mg by mouth daily.  0  . LEVEMIR FLEXTOUCH 100 UNIT/ML FlexPen Inject 100 mLs into the skin daily.    . levothyroxine (SYNTHROID) 75 MCG tablet Take 75 mcg by mouth every morning.    . olmesartan (BENICAR) 40 MG tablet Take 40 mg by mouth daily.    . Rivaroxaban (XARELTO) 15 MG TABS tablet TAKE 1 TABLET BY MOUTH EVERY DAY WITH DINNER 90 tablet 3  . TRAVATAN Z 0.004 % SOLN ophthalmic solution Place 1 drop into both eyes every evening.  0  . metoprolol succinate (TOPROL XL) 25 MG 24 hr tablet Take 1 tablet (25 mg total) by mouth daily. 30 tablet 11  . torsemide (DEMADEX) 20 MG tablet Take 40 mg by mouth daily.     No current facility-administered medications for this visit.     Past Medical History:  Diagnosis Date  . Arthritis   . Asthma   . Cardiomyopathy ([facility])   . COPD (chronic obstructive  pulmonary disease) ([facility])   . Diabetes mellitus   . Glaucoma   . High cholesterol   . Hypertension   . Systolic dysfunction, left ventricle     ROS:   All systems reviewed and negative except as noted in the HPI.   Past Surgical History:  Procedure Laterality Date  . A-FLUTTER ABLATION N/A 12/09/2019   Procedure: A-FLUTTER ABLATION;  Surgeon: ,  W, MD;  Location[facility];  Service: Cardiovascular;  Laterality: N/A;  . ABDOMINAL HYSTERECTOMY    . AV NODE ABLATION N/A 12/14/2019   Procedure: AV NODE ABLATION;  Surgeon: ,  W, MD;  Location: [facility];  Service: Cardiovascular;  Laterality: N/A;  . HIP SURGERY    . JOINT REPLACEMENT    . left lumpectomy    . PACEMAKER IMPLANT N/A 12/14/2019   Procedure: PACEMAKER IMPLANT;  Surgeon: ,  W, MD;  Location: [facility];  Service: Cardiovascular;  Laterality: N/A;  . REPLACEMENT TOTAL KNEE BILATERAL     20'$ 09-Right, 2001-Left  . Right bog toe fusion    . Right trigger finger surgery       Family History  Problem Relation Age of Onset  . Diabetes Brother      Social History  Socioeconomic History  . Marital status: Single    Spouse name: Not on file  . Number of children: Not on file  . Years of education: Not on file  . Highest education level: Not on file  Occupational History  . Not on file  Tobacco Use  . Smoking status: Never Smoker  . Smokeless tobacco: Never Used  Substance and Sexual Activity  . Alcohol use: No  . Drug use: No  . Sexual activity: Never  Other Topics Concern  . Not on file  Social History Narrative  . Not on file   Social Determinants of Health   Financial Resource Strain: Not on file  Food Insecurity: Not on file  Transportation Needs: Not on file  Physical Activity: Not on file  Stress: Not on file  Social Connections: Not on file  Intimate Partner Violence: Not on file     BP 136/68   Pulse 70   Ht '5\' 4"'$  (1.626 m)   Wt 196  lb (88.9 kg)   SpO2 (!) 87%   BMI 33.64 kg/m   Physical Exam:  Chronically ill appearing NAD HEENT: Unremarkable Neck:  No JVD, no thyromegally Lymphatics:  No adenopathy Back:  No CVA tenderness Lungs:  Clear with minimal rales in the bases HEART:  Regular rate rhythm, no murmurs, no rubs, no clicks Abd:  soft, positive bowel sounds, no organomegally, no rebound, no guarding Ext:  2 plus pulses, no edema, no cyanosis, no clubbing Skin:  No rashes no nodules Neuro:  CN II through XII intact, motor grossly intact  EKG  - NSR with ventricular pacing  DEVICE  Normal device function.  See PaceArt for details.   Assess/Plan: 1. Atrial fi b - she is well controlled. She will continue her current meds. 2. CHB - she is s/p AV node ablation with CHB. She is asymptomatic s/p PPM insertion 3. Chronic diastolic heart failure - her symptoms are class 2B. She is sedentary.  4. Obesity - she is encouraged to lose weight.  Lydia Myers Lydia Finnicum,MD

## 2021-04-04 NOTE — Patient Instructions (Signed)
Medication Instructions:  Your physician recommends that you continue on your current medications as directed. Please refer to the Current Medication list given to you today.  Labwork: None ordered.  Testing/Procedures: None ordered.  Follow-Up: Your physician wants you to follow-up in: one year with Cristopher Peru, MD or one of the following Advanced Practice Providers on your designated Care Team:    Chanetta Marshall, NP  Tommye Standard, PA-C  Legrand Como "Jonni Sanger" [place], [place]  Remote monitoring is used to monitor your Pacemaker from home. This monitoring reduces the number of office visits required to check your device to one time per year. It allows [place] to keep an eye on the functioning of your device to ensure it is working properly. You are scheduled for a device check from home on 06/12/2021. You may send your transmission at any time that day. If you have a wireless device, the transmission will be sent automatically. After your physician reviews your transmission, you will receive a postcard with your next transmission date.  Any Other Special Instructions Will Be Listed Below (If Applicable).  If you need a refill on your cardiac medications before your next appointment, please call your pharmacy.

## 2021-04-04 NOTE — Progress Notes (Signed)
Remote pacemaker transmission.   

## 2021-04-05 DIAGNOSIS — N2581 Secondary hyperparathyroidism of renal origin: Secondary | ICD-10-CM | POA: Diagnosis not present

## 2021-04-05 DIAGNOSIS — D631 Anemia in chronic kidney disease: Secondary | ICD-10-CM | POA: Diagnosis not present

## 2021-04-05 DIAGNOSIS — I129 Hypertensive chronic kidney disease with stage 1 through stage 4 chronic kidney disease, or unspecified chronic kidney disease: Secondary | ICD-10-CM | POA: Diagnosis not present

## 2021-04-05 DIAGNOSIS — N184 Chronic kidney disease, stage 4 (severe): Secondary | ICD-10-CM | POA: Diagnosis not present

## 2021-04-05 DIAGNOSIS — R609 Edema, unspecified: Secondary | ICD-10-CM | POA: Diagnosis not present

## 2021-04-05 DIAGNOSIS — E875 Hyperkalemia: Secondary | ICD-10-CM | POA: Diagnosis not present

## 2021-05-23 ENCOUNTER — Other Ambulatory Visit: Payer: Self-pay

## 2021-05-23 MED ORDER — AMIODARONE HCL 200 MG PO TABS: ORAL_TABLET | ORAL | 3 refills | Status: DC

## 2021-06-01 ENCOUNTER — Ambulatory Visit: Payer: Medicare Other | Admitting: Podiatry

## 2021-06-01 ENCOUNTER — Other Ambulatory Visit: Payer: Self-pay

## 2021-06-01 ENCOUNTER — Ambulatory Visit (INDEPENDENT_AMBULATORY_CARE_PROVIDER_SITE_OTHER): Payer: Medicare Other

## 2021-06-01 DIAGNOSIS — M7742 Metatarsalgia, left foot: Secondary | ICD-10-CM | POA: Diagnosis not present

## 2021-06-01 DIAGNOSIS — I739 Peripheral vascular disease, unspecified: Secondary | ICD-10-CM

## 2021-06-01 DIAGNOSIS — M778 Other enthesopathies, not elsewhere classified: Secondary | ICD-10-CM | POA: Diagnosis not present

## 2021-06-01 DIAGNOSIS — B351 Tinea unguium: Secondary | ICD-10-CM | POA: Diagnosis not present

## 2021-06-01 DIAGNOSIS — M7741 Metatarsalgia, right foot: Secondary | ICD-10-CM

## 2021-06-01 DIAGNOSIS — M779 Enthesopathy, unspecified: Secondary | ICD-10-CM | POA: Diagnosis not present

## 2021-06-01 DIAGNOSIS — M79672 Pain in left foot: Secondary | ICD-10-CM

## 2021-06-01 DIAGNOSIS — M79674 Pain in right toe(s): Secondary | ICD-10-CM

## 2021-06-01 DIAGNOSIS — Z7901 Long term (current) use of anticoagulants: Secondary | ICD-10-CM

## 2021-06-01 DIAGNOSIS — M79675 Pain in left toe(s): Secondary | ICD-10-CM | POA: Diagnosis not present

## 2021-06-01 NOTE — Patient Instructions (Signed)
I have ordered a circulation test that will be done at [facility] on [address]. If you do not hear for them about scheduling within the next 1 week, or you have any questions please give [place] a call at [phone].

## 2021-06-01 NOTE — Progress Notes (Signed)
Subjective:   Patient ID: Lydia Myers, female   DOB: 80 y.o.   MRN: [id number]   HPI 80 year old female presents the office today for concerns of thick, elongated toenails that she cannot trim her self.  Denies any open sores but she also does get pain submetatarsal 5 that she points in the left foot.  This is been ongoing getting worse the last 2 to 3 weeks.  No recent injury.  Times her pain level is at maximum 8/10.  Worse with walking or pressure.  She is on Xarelto  Review of Systems  All other systems reviewed and are negative.  Past Medical History:  Diagnosis Date   Arthritis    Asthma    Cardiomyopathy ([place])    COPD (chronic obstructive pulmonary disease) (HCC)    Diabetes mellitus    Glaucoma    High cholesterol    Hypertension    Systolic dysfunction, left ventricle     Past Surgical History:  Procedure Laterality Date   A-FLUTTER ABLATION N/A 12/09/2019   Procedure: A-FLUTTER ABLATION;  Surgeon: Evans Lance, MD;  Location: [place] [facility] [facility];  Service: Cardiovascular;  Laterality: N/A;   ABDOMINAL HYSTERECTOMY     AV NODE ABLATION N/A 12/14/2019   Procedure: AV NODE ABLATION;  Surgeon: Evans Lance, MD;  Location: [facility];  Service: Cardiovascular;  Laterality: N/A;   HIP SURGERY     JOINT REPLACEMENT     left lumpectomy     PACEMAKER IMPLANT N/A 12/14/2019   Procedure: PACEMAKER IMPLANT;  Surgeon: Evans Lance, MD;  Location: [facility];  Service: Cardiovascular;  Laterality: N/A;   REPLACEMENT TOTAL KNEE BILATERAL     2009-Right, 2001-Left   Right bog toe fusion     Right trigger finger surgery       Current Outpatient Medications:    ACCU-CHEK GUIDE test strip, USE TO CHECK FINGER STICK BLOOD SUGAR TWICE DAILY, Disp: , Rfl:    Accu-Chek Softclix Lancets lancets, 2 (two) times daily., Disp: , Rfl:    acetaminophen (TYLENOL) 325 MG tablet, Take 1-2 tablets (325-650 mg total) by mouth every 4 (four) hours as needed for mild pain.,  Disp:  , Rfl:    ALPRAZolam (XANAX) 0.25 MG tablet, Take 0.25 mg by mouth daily as needed., Disp: , Rfl:    amiodarone (PACERONE) 200 MG tablet, Take one tablet by mouth daily Monday through Saturday.  Do NOT take on Sunday., Disp: 90 tablet, Rfl: 3   amLODipine (NORVASC) 10 MG tablet, Take 10 mg by mouth daily., Disp: , Rfl:    atorvastatin (LIPITOR) 10 MG tablet, TAKE 1 TABLET BY MOUTH EVERY DAY AT 6PM, Disp: 90 tablet, Rfl: 0   AZOPT 1 % ophthalmic suspension, Place 1 drop into both eyes 2 (two) times daily., Disp: , Rfl: 0   BD PEN NEEDLE NANO 2ND GEN 32G X 4 MM MISC, USE ONCE A DAY WITH BASAGLAR KWIK PEN, Disp: , Rfl:    Blood Glucose Monitoring Suppl (ACCU-CHEK GUIDE) w/Device KIT, USE TO CHECK BLOOD SUGAR 2 TIMES A DAY, Disp: , Rfl:    JANUVIA 100 MG tablet, Take 100 mg by mouth daily., Disp: , Rfl: 0   LEVEMIR FLEXTOUCH 100 UNIT/ML FlexPen, Inject 100 mLs into the skin daily., Disp: , Rfl:    levothyroxine (SYNTHROID) 75 MCG tablet, Take 75 mcg by mouth every morning., Disp: , Rfl:    levothyroxine (SYNTHROID) 88 MCG tablet, Take 88 mcg by  mouth every morning., Disp: , Rfl:    losartan (COZAAR) 25 MG tablet, Take 25 mg by mouth daily., Disp: , Rfl:    metoprolol succinate (TOPROL XL) 25 MG 24 hr tablet, Take 1 tablet (25 mg total) by mouth daily., Disp: 30 tablet, Rfl: 11   olmesartan (BENICAR) 40 MG tablet, Take 40 mg by mouth daily., Disp: , Rfl:    Rivaroxaban (XARELTO) 15 MG TABS tablet, TAKE 1 TABLET BY MOUTH EVERY DAY WITH DINNER, Disp: 90 tablet, Rfl: 3   torsemide (DEMADEX) 20 MG tablet, Take 40 mg by mouth daily., Disp: , Rfl:    TRAVATAN Z 0.004 % SOLN ophthalmic solution, Place 1 drop into both eyes every evening., Disp: , Rfl: 0  Allergies  Allergen Reactions   Shrimp [Shellfish Allergy] Nausea And Vomiting          Objective:  Physical Exam  General: AAO x3, NAD  Dermatological: Nails are hypertrophic, dystrophic, brittle, discolored, elongated 10. No surrounding  redness or drainage. Tenderness nails 1-5 bilaterally. No open lesions or pre-ulcerative lesions are identified today.  Dry, scaly skin present but there is no skin fissures or open sores.  Vascular: Dorsalis Pedis artery and Posterior Tibial artery pedal pulses are 1/4 bilateral with immedate capillary fill time.There is no pain with calf compression, swelling, warmth, erythema.   Neruologic: Grossly intact via light touch bilateral.   Musculoskeletal: Positive metatarsal heads plantarly with atrophy of the fat pad left side worse than right.  Localized edema submetatarsal 5.  No area of pinpoint tenderness.  No pain of the dorsal metatarsals or to the other areas of the foot, ankle.  Muscular strength 5/5 in all groups tested bilateral.  Gait: Unassisted, Nonantalgic.       Assessment:   Symptomatic onychomycosis, capsulitis left foot     Plan:  -Treatment options discussed including all alternatives, risks, and complications -Etiology of symptoms were discussed -X-rays were obtained and reviewed with the patient.  There is no evidence of acute fracture or stress fracture identified today.  Osteopenia noted. -Regards to left foot pain dispensed metatarsal offloading pads.  Unfortunately, plan oral anti-inflammatories given Xarelto.  Consider steroid injection if needed -Sharply debrided the nails x10 without any complications or bleeding -ABI ordered given decreased pulses and history of diabetes. -AmLactin for dry skin     Trula Slade DPM

## 2021-06-05 ENCOUNTER — Other Ambulatory Visit: Payer: Self-pay | Admitting: Podiatry

## 2021-06-05 ENCOUNTER — Encounter (HOSPITAL_COMMUNITY): Payer: Medicare Other

## 2021-06-05 DIAGNOSIS — I739 Peripheral vascular disease, unspecified: Secondary | ICD-10-CM

## 2021-06-05 DIAGNOSIS — M779 Enthesopathy, unspecified: Secondary | ICD-10-CM

## 2021-06-12 ENCOUNTER — Ambulatory Visit (INDEPENDENT_AMBULATORY_CARE_PROVIDER_SITE_OTHER): Payer: Medicare Other

## 2021-06-12 DIAGNOSIS — I495 Sick sinus syndrome: Secondary | ICD-10-CM | POA: Diagnosis not present

## 2021-06-13 LAB — CUP PACEART REMOTE DEVICE CHECK
Battery Remaining Longevity: 116 mo
Battery Voltage: 3.01 V
Brady Statistic AP VP Percent: 86.23 %
Brady Statistic AP VS Percent: 0 %
Brady Statistic AS VP Percent: 13.75 %
Brady Statistic AS VS Percent: 0.03 %
Brady Statistic RA Percent Paced: 85.86 %
Brady Statistic RV Percent Paced: 99.97 %
Date Time Interrogation Session: 20220808064118
Implantable Lead Implant Date: 20210208
Implantable Lead Implant Date: 20210208
Implantable Lead Location: 753859
Implantable Lead Location: 753860
Implantable Lead Model: 3830
Implantable Lead Model: 5076
Implantable Pulse Generator Implant Date: 20210208
Lead Channel Impedance Value: 380 Ohm
Lead Channel Impedance Value: 399 Ohm
Lead Channel Impedance Value: 494 Ohm
Lead Channel Impedance Value: 513 Ohm
Lead Channel Pacing Threshold Amplitude: 0.625 V
Lead Channel Pacing Threshold Amplitude: 0.875 V
Lead Channel Pacing Threshold Pulse Width: 0.4 ms
Lead Channel Pacing Threshold Pulse Width: 0.4 ms
Lead Channel Sensing Intrinsic Amplitude: 17 mV
Lead Channel Sensing Intrinsic Amplitude: 2.125 mV
Lead Channel Sensing Intrinsic Amplitude: 2.125 mV
Lead Channel Setting Pacing Amplitude: 1.5 V
Lead Channel Setting Pacing Amplitude: 2.5 V
Lead Channel Setting Pacing Pulse Width: 0.4 ms
Lead Channel Setting Sensing Sensitivity: 1.2 mV

## 2021-06-16 ENCOUNTER — Ambulatory Visit (HOSPITAL_COMMUNITY)
Admission: RE | Admit: 2021-06-16 | Discharge: 2021-06-16 | Disposition: A | Discharge status: Home / Self Care | Payer: Medicare Other | Source: Ambulatory Visit | Attending: Podiatry | Admitting: Podiatry

## 2021-06-16 ENCOUNTER — Other Ambulatory Visit: Payer: Self-pay

## 2021-06-16 DIAGNOSIS — I739 Peripheral vascular disease, unspecified: Secondary | ICD-10-CM | POA: Insufficient documentation

## 2021-06-21 ENCOUNTER — Telehealth: Payer: Self-pay | Admitting: *Deleted

## 2021-06-21 NOTE — Telephone Encounter (Signed)
-----   Message from Trula Slade, DPM sent at 06/20/2021  8:01 AM EDT ----- Lattie Haw- please let her know that the circulation test was normal. It did show some decrease blood flow into the toes but otherwise it appears that her circulation is adequate. Thank you.

## 2021-06-21 NOTE — Telephone Encounter (Signed)
Called and spoke with the patient and relayed the message per Dr Jacqualyn Posey. Lattie Haw

## 2021-07-02 IMAGING — CR DG CHEST 2V
2 series · 2 of 2 positions shown · non-contrast
Comparison: 12/11/2019

CLINICAL DATA: Pacemaker placement.

EXAM:
CHEST - 2 VIEW

[chest lat]
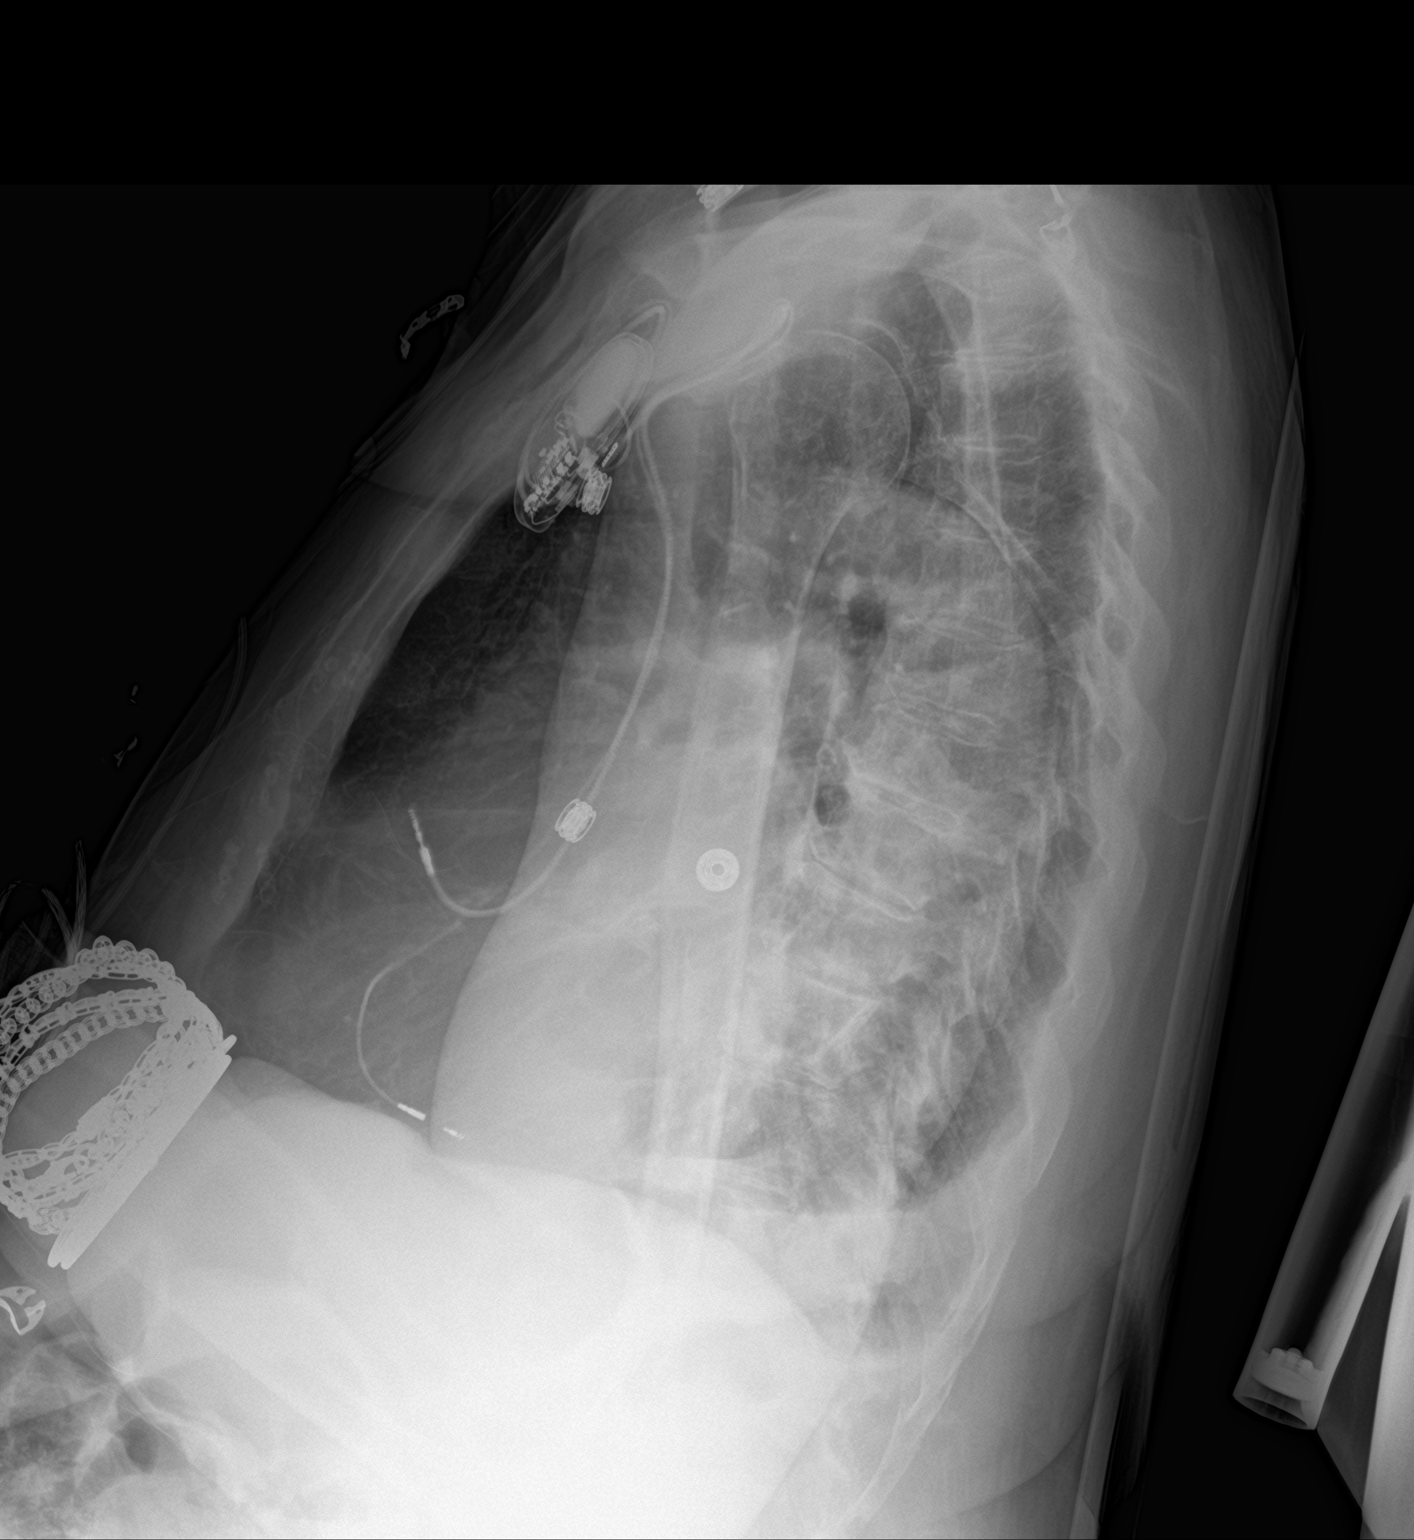

[chest ap]
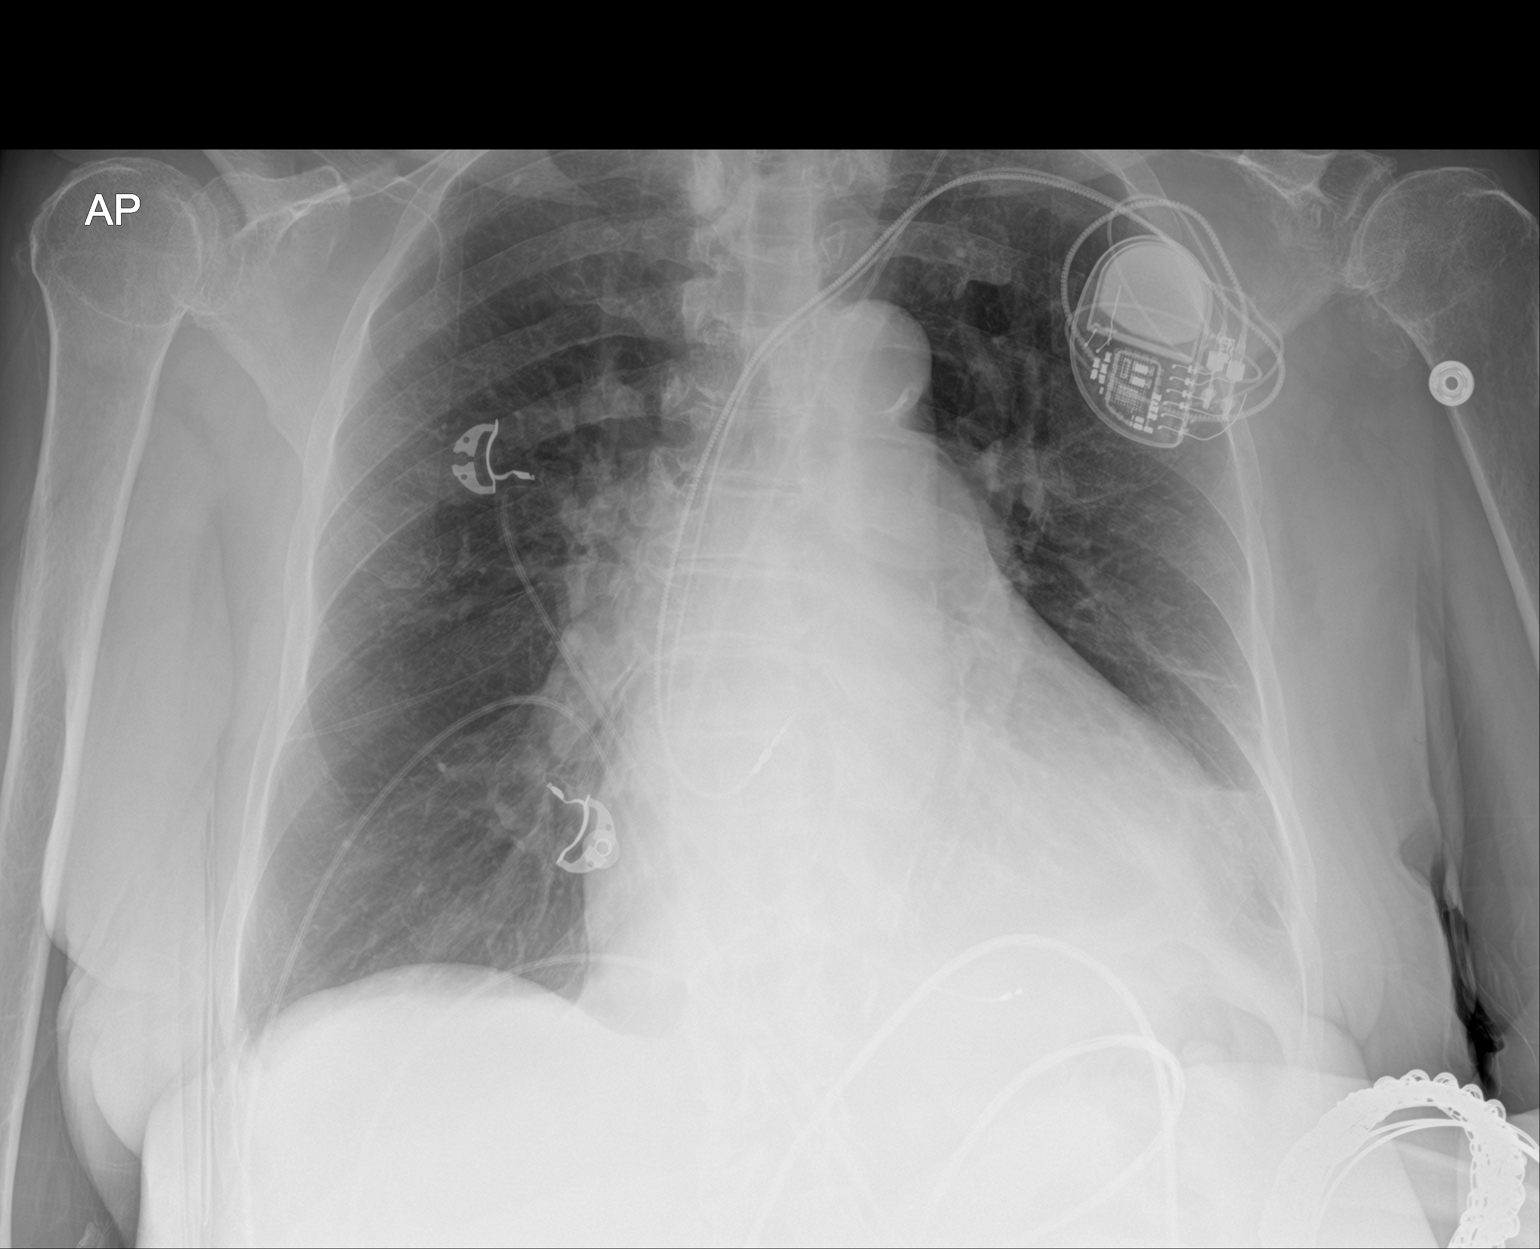

[2 of 2 positions shown; findings below may reference images not displayed]

FINDINGS: Left-sided dual chamber cardiac pacemaker has been placed. Leads in
the expected region of the right atrial appendage and right
ventricle. Heart remains large for size. Negative for pneumothorax.
Slightly increased densities at the left lung base are most
compatible with atelectasis. Difficult to exclude small pleural
effusions. Atherosclerotic calcifications at the aortic arch.
IMPRESSION: 1. Placement of cardiac pacemaker.  Negative for pneumothorax.
2. Left basilar atelectasis.  Question small pleural effusions.

## 2021-07-03 ENCOUNTER — Other Ambulatory Visit (HOSPITAL_COMMUNITY): Payer: Self-pay

## 2021-07-03 MED ORDER — RIVAROXABAN 15 MG PO TABS: ORAL_TABLET | ORAL | 3 refills | Status: DC

## 2021-07-06 NOTE — Progress Notes (Signed)
Remote pacemaker transmission.   

## 2021-08-30 DIAGNOSIS — Z1231 Encounter for screening mammogram for malignant neoplasm of breast: Secondary | ICD-10-CM | POA: Diagnosis not present

## 2021-09-06 ENCOUNTER — Encounter: Payer: Self-pay | Admitting: Podiatry

## 2021-09-06 ENCOUNTER — Other Ambulatory Visit: Payer: Self-pay

## 2021-09-06 ENCOUNTER — Other Ambulatory Visit: Payer: Self-pay | Admitting: Podiatry

## 2021-09-06 ENCOUNTER — Ambulatory Visit: Payer: Medicare Other | Admitting: Podiatry

## 2021-09-06 DIAGNOSIS — E1121 Type 2 diabetes mellitus with diabetic nephropathy: Secondary | ICD-10-CM | POA: Insufficient documentation

## 2021-09-06 DIAGNOSIS — F39 Unspecified mood [affective] disorder: Secondary | ICD-10-CM | POA: Insufficient documentation

## 2021-09-06 DIAGNOSIS — B351 Tinea unguium: Secondary | ICD-10-CM

## 2021-09-06 DIAGNOSIS — S90822A Blister (nonthermal), left foot, initial encounter: Secondary | ICD-10-CM

## 2021-09-06 DIAGNOSIS — Z794 Long term (current) use of insulin: Secondary | ICD-10-CM

## 2021-09-06 DIAGNOSIS — K5900 Constipation, unspecified: Secondary | ICD-10-CM | POA: Insufficient documentation

## 2021-09-06 DIAGNOSIS — M1610 Unilateral primary osteoarthritis, unspecified hip: Secondary | ICD-10-CM | POA: Insufficient documentation

## 2021-09-06 DIAGNOSIS — M79674 Pain in right toe(s): Secondary | ICD-10-CM | POA: Diagnosis not present

## 2021-09-06 DIAGNOSIS — F419 Anxiety disorder, unspecified: Secondary | ICD-10-CM | POA: Insufficient documentation

## 2021-09-06 DIAGNOSIS — M79675 Pain in left toe(s): Secondary | ICD-10-CM

## 2021-09-06 DIAGNOSIS — I429 Cardiomyopathy, unspecified: Secondary | ICD-10-CM | POA: Insufficient documentation

## 2021-09-06 DIAGNOSIS — I5032 Chronic diastolic (congestive) heart failure: Secondary | ICD-10-CM | POA: Insufficient documentation

## 2021-09-06 DIAGNOSIS — N183 Chronic kidney disease, stage 3 unspecified: Secondary | ICD-10-CM | POA: Insufficient documentation

## 2021-09-06 DIAGNOSIS — L089 Local infection of the skin and subcutaneous tissue, unspecified: Secondary | ICD-10-CM

## 2021-09-06 DIAGNOSIS — J449 Chronic obstructive pulmonary disease, unspecified: Secondary | ICD-10-CM | POA: Insufficient documentation

## 2021-09-06 DIAGNOSIS — D6859 Other primary thrombophilia: Secondary | ICD-10-CM | POA: Insufficient documentation

## 2021-09-06 DIAGNOSIS — B353 Tinea pedis: Secondary | ICD-10-CM | POA: Diagnosis not present

## 2021-09-06 DIAGNOSIS — E213 Hyperparathyroidism, unspecified: Secondary | ICD-10-CM | POA: Insufficient documentation

## 2021-09-06 MED ORDER — MICONAZOLE NITRATE 2 % EX AERP: INHALATION_SPRAY | CUTANEOUS | 0 refills | Status: DC

## 2021-09-06 NOTE — Progress Notes (Signed)
Subjective: Patient presents today preventative diabetic foot care and painful thick toenails that are difficult to trim. Pain interferes with ambulation. Aggravating factors include wearing enclosed shoe gear. Pain is relieved with periodic professional debridement.  New concern today: Location: lesion on plantar aspect of her left foot. Patient states she has had tenderness for the past few months. She denies noticing any drainage in her socks, denies any preceding episode of trauma. She denies any redness, drainage or swelling of foot.  She does have chronic lower extremity edema bilaterally. She denies any fever, chills, night sweats, nausea or vomiting. She states she is on diuretics and goes to the bathroom quite a lot.  Patient states she does not check her blood glucose daily.     Lydia Pillar, MD is patient's PCP. Last visit was 03/28/2021.  Current Outpatient Medications on File Prior to Visit  Medication Sig Dispense Refill   acetaminophen (TYLENOL) 325 MG tablet 1-2 tablets as needed for mild pain     amiodarone (PACERONE) 200 MG tablet 1 tablet     amLODipine (NORVASC) 10 MG tablet 1 tablet     levothyroxine (SYNTHROID) 88 MCG tablet 1 tablet in the morning on an empty stomach     sitaGLIPtin (JANUVIA) 100 MG tablet 1 tablet     white petrolatum (VASELINE) GEL 1 application     ACCU-CHEK GUIDE test strip USE TO CHECK FINGER STICK BLOOD SUGAR TWICE DAILY     Accu-Chek Softclix Lancets lancets 2 (two) times daily.     acetaminophen (TYLENOL) 325 MG tablet Take 1-2 tablets (325-650 mg total) by mouth every 4 (four) hours as needed for mild pain.     ALPRAZolam (XANAX) 0.25 MG tablet Take 0.25 mg by mouth daily as needed.     amiodarone (PACERONE) 200 MG tablet Take one tablet by mouth daily Monday through Saturday.  Do NOT take on Sunday. 90 tablet 3   amLODipine (NORVASC) 10 MG tablet Take 10 mg by mouth daily.     atorvastatin (LIPITOR) 10 MG tablet TAKE 1 TABLET BY MOUTH  EVERY DAY AT 6PM 90 tablet 0   AZOPT 1 % ophthalmic suspension Place 1 drop into both eyes 2 (two) times daily.  0   BD PEN NEEDLE NANO 2ND GEN 32G X 4 MM MISC USE ONCE A DAY WITH BASAGLAR KWIK PEN     Blood Glucose Monitoring Suppl (ACCU-CHEK GUIDE) w/Device KIT USE TO CHECK BLOOD SUGAR 2 TIMES A DAY     LEVEMIR FLEXTOUCH 100 UNIT/ML FlexPen Inject 100 mLs into the skin daily.     levothyroxine (SYNTHROID) 75 MCG tablet Take 75 mcg by mouth every morning.     levothyroxine (SYNTHROID) 88 MCG tablet Take 88 mcg by mouth every morning.     losartan (COZAAR) 25 MG tablet Take 25 mg by mouth daily.     losartan (COZAAR) 25 MG tablet Take 1 tablet by mouth daily.     metoprolol succinate (TOPROL-XL) 25 MG 24 hr tablet Take 1 tablet by mouth daily.     olmesartan (BENICAR) 40 MG tablet Take 40 mg by mouth daily.     PFIZER-BIONT COVID-19 VAC-TRIS SUSP injection      Rivaroxaban (XARELTO) 15 MG TABS tablet 1 tablet with food     torsemide (DEMADEX) 20 MG tablet 1 tablet     TRAVATAN Z 0.004 % SOLN ophthalmic solution Place 1 drop into both eyes every evening.  0   No current facility-administered medications on file prior  to visit.     Allergies  Allergen Reactions   Shrimp [Shellfish Allergy] Nausea And Vomiting   Shellfish-Derived Products     Other reaction(s): Unknown     Objective: There were no vitals filed for this visit.  Lydia Myers is a pleasant 80 y.o. female obese in NAD.Lydia Kitchen AAO X 3.  Vascular Examination: CFT immediate b/l LE. Faintly palpable DP/PT pulses b/l LE. Digital hair absent b/l. Skin temperature gradient WNL b/l. No pain with calf compression b/l. Lower extremity skin temperature gradient within normal limits. Evidence of chronic venous insufficiency b/l lower extremities.  Dermatological Examination: No open wounds b/l LE. No interdigital macerations noted b/l LE. Toenails 1-5 b/l elongated, discolored, dystrophic, thickened, crumbly with subungual debris and  tenderness to dorsal palpation. Pedal skin noted to be dry b/l feet.  Macerated lesion plantar aspect of left forefoot measures 3.5 x 3.5 cm. Appears to have pseudomonal component most likely to moisture. No fluid expressed. +Tenderness to palpation. No odor. No erythema, no warmth.    Musculoskeletal Examination: Normal muscle strength 5/5 to all lower extremity muscle groups bilaterally. Hammertoe deformity noted 2-5 b/l.  Neurological Examination: Protective sensation intact 5/5 intact bilaterally with 10g monofilament b/l.  Assessment and Plan 1. Pain due to onychomycosis of toenails of both feet   2. Tinea pedis of left foot   3. Infected blister of left foot, initial encounter   4. Type 2 diabetes mellitus with diabetic nephropathy, with long-term current use of insulin (HCC)   Tinea pedis of left foot - Miconazole Nitrate 2 % AERP; Spray on plantar aspect of left foot once daily  Dispense: 128 g; Refill: 0 -Infected blister of left foot, initial encounter - WOUND CULTURE -Patient was evaluated and treated and all questions answered.  -Moisture appears to be a factor in this condition. -Patient/POA/Family member educated on diagnosis and treatment plan. -Discussed patient case with Dr. Jacqualyn Posey. She will follow up with him in 2-4 weeks for re-evaluation. She will follow up with me in 3 months for routine foot care. -Toenails 1-5 b/l were debrided in length and girth with sterile nail nippers and dremel without iatrogenic bleeding.  -Patient/POA to call should there be question/concern in the interim.  Marzetta Board, DPM

## 2021-09-07 ENCOUNTER — Telehealth (INDEPENDENT_AMBULATORY_CARE_PROVIDER_SITE_OTHER): Payer: Medicare Other | Admitting: *Deleted

## 2021-09-07 NOTE — Telephone Encounter (Signed)
Patient is calling because she had her sister to pick up prescription and thought she was given wrong medication, was a cream OTC. Called pharmacy and they said that the medication is OTC, was cream, didn't have the spray. Returned the call back to patient and explained the situation and that if package is not opened, may call another pharmacy and exchange it out for the spray,verbalized understanding and said that she would call her sister to do this.

## 2021-09-09 LAB — WOUND CULTURE
MICRO NUMBER:: 12583997
SPECIMEN QUALITY:: ADEQUATE

## 2021-09-09 LAB — HOUSE ACCOUNT TRACKING

## 2021-09-11 ENCOUNTER — Ambulatory Visit (INDEPENDENT_AMBULATORY_CARE_PROVIDER_SITE_OTHER): Payer: Medicare Other

## 2021-09-11 DIAGNOSIS — I519 Heart disease, unspecified: Secondary | ICD-10-CM

## 2021-09-11 DIAGNOSIS — I495 Sick sinus syndrome: Secondary | ICD-10-CM

## 2021-09-11 LAB — CUP PACEART REMOTE DEVICE CHECK
Battery Remaining Longevity: 113 mo
Battery Voltage: 3.01 V
Brady Statistic AP VP Percent: 88 %
Brady Statistic AP VS Percent: 0 %
Brady Statistic AS VP Percent: 11.99 %
Brady Statistic AS VS Percent: 0.01 %
Brady Statistic RA Percent Paced: 87.92 %
Brady Statistic RV Percent Paced: 99.99 %
Date Time Interrogation Session: 20221106183329
Implantable Lead Implant Date: 20210208
Implantable Lead Implant Date: 20210208
Implantable Lead Location: 753859
Implantable Lead Location: 753860
Implantable Lead Model: 3830
Implantable Lead Model: 5076
Implantable Pulse Generator Implant Date: 20210208
Lead Channel Impedance Value: 380 Ohm
Lead Channel Impedance Value: 399 Ohm
Lead Channel Impedance Value: 494 Ohm
Lead Channel Impedance Value: 513 Ohm
Lead Channel Pacing Threshold Amplitude: 0.625 V
Lead Channel Pacing Threshold Amplitude: 1 V
Lead Channel Pacing Threshold Pulse Width: 0.4 ms
Lead Channel Pacing Threshold Pulse Width: 0.4 ms
Lead Channel Sensing Intrinsic Amplitude: 17 mV
Lead Channel Sensing Intrinsic Amplitude: 2.375 mV
Lead Channel Sensing Intrinsic Amplitude: 2.375 mV
Lead Channel Setting Pacing Amplitude: 1.5 V
Lead Channel Setting Pacing Amplitude: 2.5 V
Lead Channel Setting Pacing Pulse Width: 0.4 ms
Lead Channel Setting Sensing Sensitivity: 1.2 mV

## 2021-09-11 MED ORDER — CIPROFLOXACIN HCL 500 MG PO TABS: ORAL_TABLET | ORAL | 0 refills | Status: DC

## 2021-09-11 MED ORDER — CULTURELLE DIGESTIVE DAILY PO CAPS: ORAL_CAPSULE | ORAL | 0 refills | Status: DC

## 2021-09-11 NOTE — Telephone Encounter (Signed)
Returned the call to patient for update on the spray powder at pharmacy, said that she went and got a refund, will not bother with it,has figured out what's wrong with feet, shoes are worned out.

## 2021-09-11 NOTE — Telephone Encounter (Signed)
Called patient to inform her of culture results and need for antibiotics. She asked me to send Rx to her pharmacy and she would pick up tomorrow.  Advised follow up appt with Dr. Jacqualyn Myers for next week and she declined stating she could not afford the copay. Patient states she will call if she has any problems.

## 2021-09-19 NOTE — Progress Notes (Signed)
Remote pacemaker transmission.   

## 2021-09-20 ENCOUNTER — Telehealth: Payer: Self-pay | Admitting: *Deleted

## 2021-09-20 NOTE — Telephone Encounter (Addendum)
Patient is calling w/ concerns  about her visit on 09/06/21, stated that she thought that her nails were supposed to be trimmed but were not and questions were not addressed as far as why she was there.  I went over the office visit notes in detail with patient about what was done, and that she was supposed to   f/u with Dr Jacqualyn Posey as suggested in notes concerning her callus(did not wish to make another appointment concerning callus right now), has a routine appointment w/ Dr Elisha Ponder for the nails.  She verbalized understanding but said would like to see Dr Jacqualyn Posey for all of her foot care needs, again I explained that he would take care of her callus and that Dr Elisha Ponder will treat her for routine foot care(appt. 12/18/21),said that she will leave everything as is for now.

## 2021-09-26 DIAGNOSIS — E039 Hypothyroidism, unspecified: Secondary | ICD-10-CM | POA: Diagnosis not present

## 2021-09-26 DIAGNOSIS — Z0000 Encounter for general adult medical examination without abnormal findings: Secondary | ICD-10-CM | POA: Diagnosis not present

## 2021-09-26 DIAGNOSIS — E1121 Type 2 diabetes mellitus with diabetic nephropathy: Secondary | ICD-10-CM | POA: Diagnosis not present

## 2021-09-26 DIAGNOSIS — E213 Hyperparathyroidism, unspecified: Secondary | ICD-10-CM | POA: Diagnosis not present

## 2021-09-26 DIAGNOSIS — I129 Hypertensive chronic kidney disease with stage 1 through stage 4 chronic kidney disease, or unspecified chronic kidney disease: Secondary | ICD-10-CM | POA: Diagnosis not present

## 2021-09-26 DIAGNOSIS — I700 Atherosclerosis of aorta: Secondary | ICD-10-CM | POA: Diagnosis not present

## 2021-09-26 DIAGNOSIS — J449 Chronic obstructive pulmonary disease, unspecified: Secondary | ICD-10-CM | POA: Diagnosis not present

## 2021-09-26 DIAGNOSIS — I429 Cardiomyopathy, unspecified: Secondary | ICD-10-CM | POA: Diagnosis not present

## 2021-09-26 DIAGNOSIS — E7800 Pure hypercholesterolemia, unspecified: Secondary | ICD-10-CM | POA: Diagnosis not present

## 2021-09-26 DIAGNOSIS — Z1389 Encounter for screening for other disorder: Secondary | ICD-10-CM | POA: Diagnosis not present

## 2021-09-26 DIAGNOSIS — I4891 Unspecified atrial fibrillation: Secondary | ICD-10-CM | POA: Diagnosis not present

## 2021-12-11 ENCOUNTER — Ambulatory Visit (INDEPENDENT_AMBULATORY_CARE_PROVIDER_SITE_OTHER): Payer: Medicare Other

## 2021-12-11 DIAGNOSIS — I519 Heart disease, unspecified: Secondary | ICD-10-CM | POA: Diagnosis not present

## 2021-12-11 LAB — CUP PACEART REMOTE DEVICE CHECK
Battery Remaining Longevity: 109 mo
Battery Voltage: 3 V
Brady Statistic AP VP Percent: 92.55 %
Brady Statistic AP VS Percent: 0 %
Brady Statistic AS VP Percent: 7.44 %
Brady Statistic AS VS Percent: 0.01 %
Brady Statistic RA Percent Paced: 92.5 %
Brady Statistic RV Percent Paced: 99.99 %
Date Time Interrogation Session: 20230205192549
Implantable Lead Implant Date: 20210208
Implantable Lead Implant Date: 20210208
Implantable Lead Location: 753859
Implantable Lead Location: 753860
Implantable Lead Model: 3830
Implantable Lead Model: 5076
Implantable Pulse Generator Implant Date: 20210208
Lead Channel Impedance Value: 380 Ohm
Lead Channel Impedance Value: 380 Ohm
Lead Channel Impedance Value: 456 Ohm
Lead Channel Impedance Value: 513 Ohm
Lead Channel Pacing Threshold Amplitude: 0.625 V
Lead Channel Pacing Threshold Amplitude: 1.125 V
Lead Channel Pacing Threshold Pulse Width: 0.4 ms
Lead Channel Pacing Threshold Pulse Width: 0.4 ms
Lead Channel Sensing Intrinsic Amplitude: 17 mV
Lead Channel Sensing Intrinsic Amplitude: 2 mV
Lead Channel Sensing Intrinsic Amplitude: 2 mV
Lead Channel Setting Pacing Amplitude: 1.5 V
Lead Channel Setting Pacing Amplitude: 2.5 V
Lead Channel Setting Pacing Pulse Width: 0.4 ms
Lead Channel Setting Sensing Sensitivity: 1.2 mV

## 2021-12-14 NOTE — Progress Notes (Signed)
Remote pacemaker transmission.   

## 2021-12-18 ENCOUNTER — Ambulatory Visit: Payer: Medicare Other | Admitting: Podiatry

## 2022-01-02 DIAGNOSIS — E1121 Type 2 diabetes mellitus with diabetic nephropathy: Secondary | ICD-10-CM | POA: Diagnosis not present

## 2022-01-02 DIAGNOSIS — E1169 Type 2 diabetes mellitus with other specified complication: Secondary | ICD-10-CM | POA: Diagnosis not present

## 2022-01-02 DIAGNOSIS — E039 Hypothyroidism, unspecified: Secondary | ICD-10-CM | POA: Diagnosis not present

## 2022-02-18 IMAGING — CR DG CHEST 2V
2 series · 2 of 2 positions shown · non-contrast
Comparison: 12/15/2019

CLINICAL DATA: Shortness of breath for several months

EXAM:
CHEST - 2 VIEW

[chest ap]
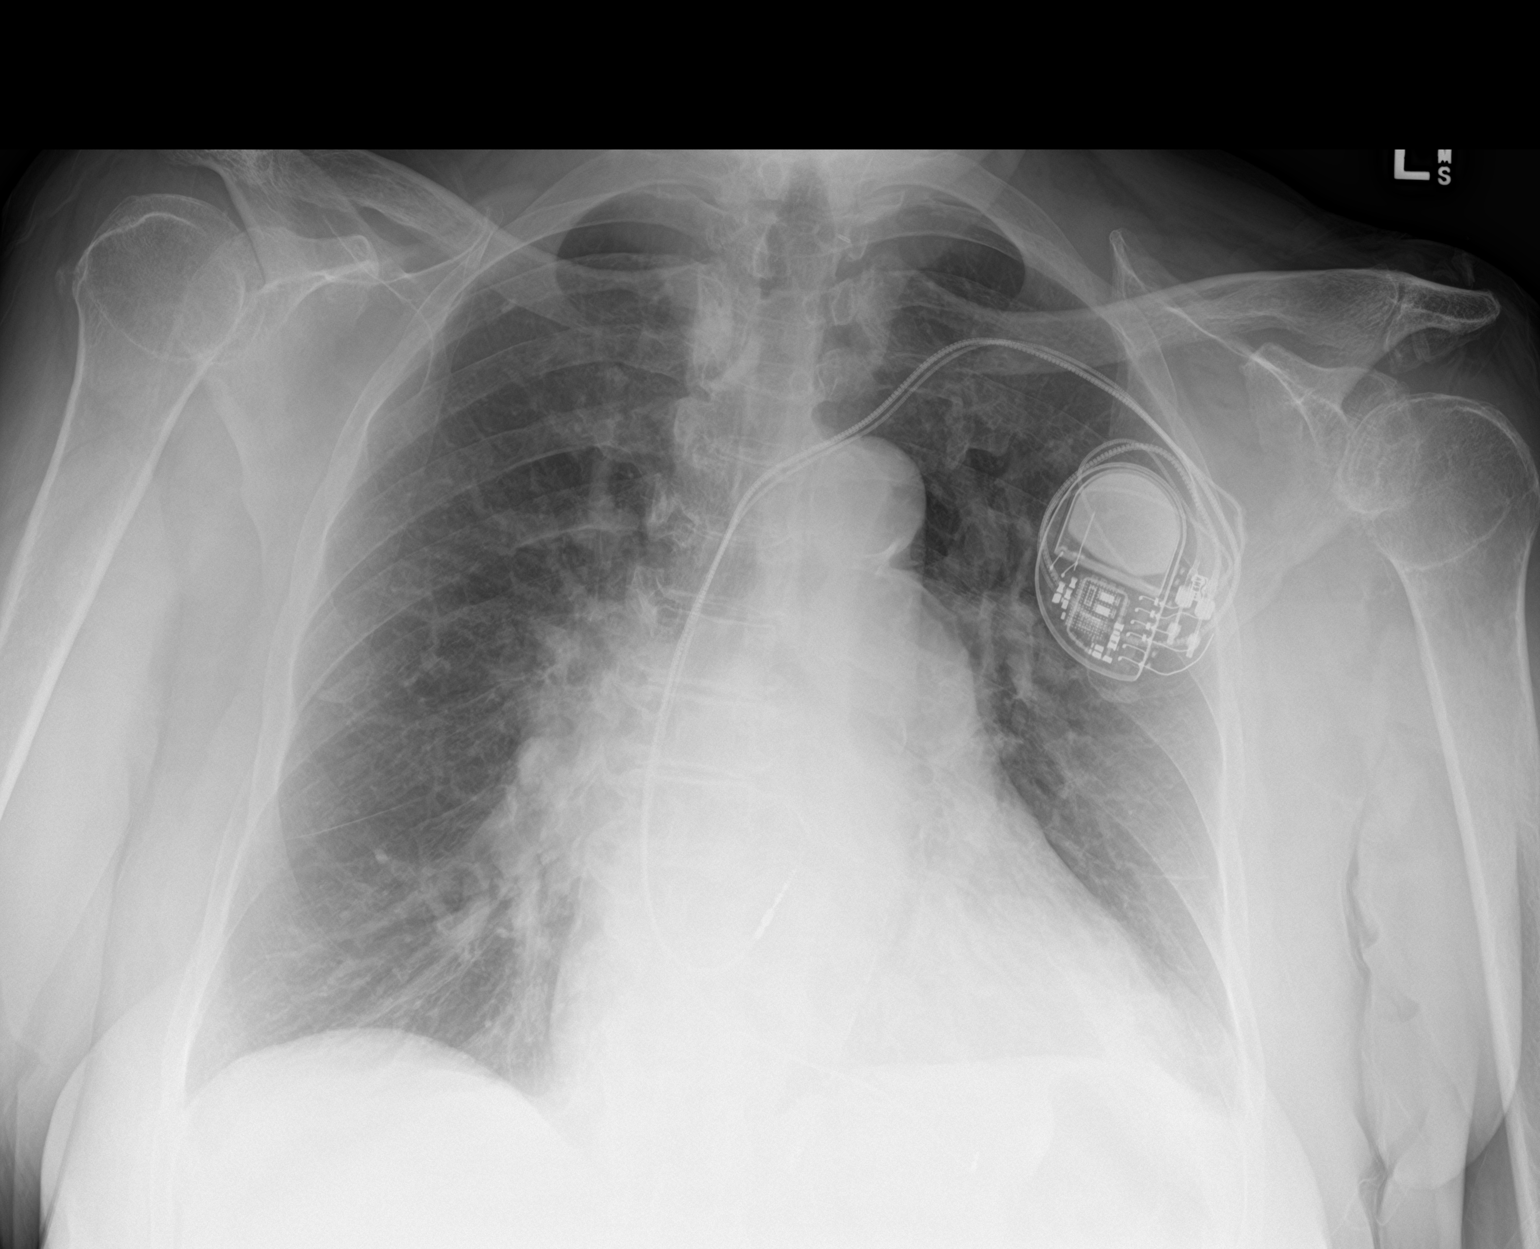

[chest lat]
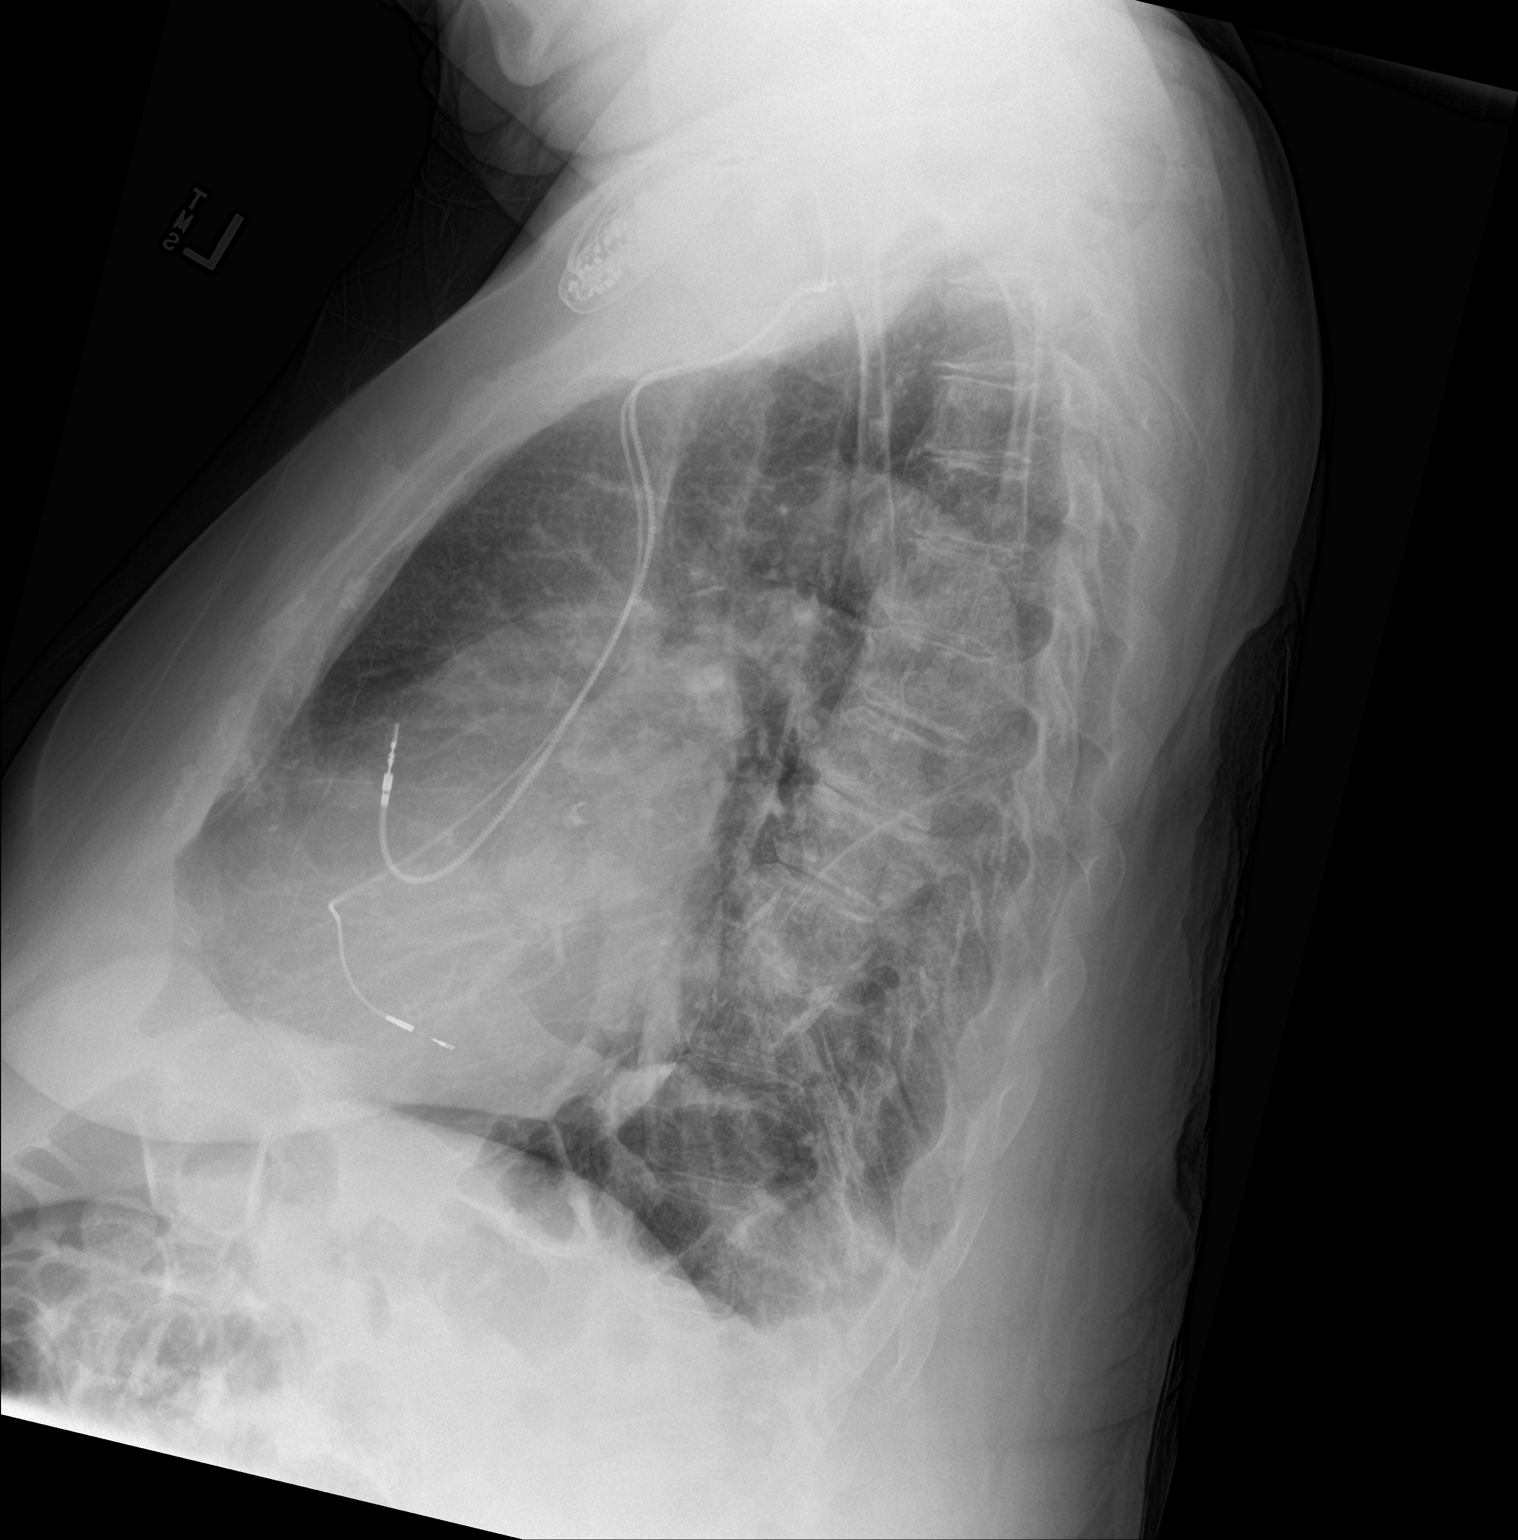

[2 of 2 positions shown; findings below may reference images not displayed]

FINDINGS: Cardiac shadow is mildly enlarged but stable. Pacing device is again
seen and stable. Aortic calcifications are noted. Lungs are well
aerated bilaterally. No focal infiltrate is noted. Chronic blunting
of left costophrenic angle posteriorly is seen. No bony abnormality
is noted.
IMPRESSION: Stable appearance of the chest when compared with the prior exam.

## 2022-03-12 ENCOUNTER — Ambulatory Visit (INDEPENDENT_AMBULATORY_CARE_PROVIDER_SITE_OTHER): Payer: Medicare Other

## 2022-03-12 DIAGNOSIS — I495 Sick sinus syndrome: Secondary | ICD-10-CM

## 2022-03-13 LAB — CUP PACEART REMOTE DEVICE CHECK
Battery Remaining Longevity: 106 mo
Battery Voltage: 3 V
Brady Statistic AP VP Percent: 97.73 %
Brady Statistic AP VS Percent: 0 %
Brady Statistic AS VP Percent: 2.26 %
Brady Statistic AS VS Percent: 0.01 %
Brady Statistic RA Percent Paced: 97.7 %
Brady Statistic RV Percent Paced: 99.99 %
Date Time Interrogation Session: 20230508064853
Implantable Lead Implant Date: 20210208
Implantable Lead Implant Date: 20210208
Implantable Lead Location: 753859
Implantable Lead Location: 753860
Implantable Lead Model: 3830
Implantable Lead Model: 5076
Implantable Pulse Generator Implant Date: 20210208
Lead Channel Impedance Value: 361 Ohm
Lead Channel Impedance Value: 380 Ohm
Lead Channel Impedance Value: 456 Ohm
Lead Channel Impedance Value: 494 Ohm
Lead Channel Pacing Threshold Amplitude: 0.5 V
Lead Channel Pacing Threshold Amplitude: 1 V
Lead Channel Pacing Threshold Pulse Width: 0.4 ms
Lead Channel Pacing Threshold Pulse Width: 0.4 ms
Lead Channel Sensing Intrinsic Amplitude: 17 mV
Lead Channel Sensing Intrinsic Amplitude: 2.25 mV
Lead Channel Sensing Intrinsic Amplitude: 2.25 mV
Lead Channel Setting Pacing Amplitude: 1.5 V
Lead Channel Setting Pacing Amplitude: 2.5 V
Lead Channel Setting Pacing Pulse Width: 0.4 ms
Lead Channel Setting Sensing Sensitivity: 1.2 mV

## 2022-03-30 NOTE — Progress Notes (Signed)
Remote pacemaker transmission.   

## 2022-04-04 DIAGNOSIS — E1169 Type 2 diabetes mellitus with other specified complication: Secondary | ICD-10-CM | POA: Diagnosis not present

## 2022-04-04 DIAGNOSIS — I129 Hypertensive chronic kidney disease with stage 1 through stage 4 chronic kidney disease, or unspecified chronic kidney disease: Secondary | ICD-10-CM | POA: Diagnosis not present

## 2022-04-04 DIAGNOSIS — E7800 Pure hypercholesterolemia, unspecified: Secondary | ICD-10-CM | POA: Diagnosis not present

## 2022-04-04 DIAGNOSIS — N184 Chronic kidney disease, stage 4 (severe): Secondary | ICD-10-CM | POA: Diagnosis not present

## 2022-04-04 DIAGNOSIS — I890 Lymphedema, not elsewhere classified: Secondary | ICD-10-CM | POA: Diagnosis not present

## 2022-04-04 DIAGNOSIS — E039 Hypothyroidism, unspecified: Secondary | ICD-10-CM | POA: Diagnosis not present

## 2022-04-10 ENCOUNTER — Ambulatory Visit: Payer: Medicare Other | Admitting: Podiatry

## 2022-04-10 ENCOUNTER — Encounter: Payer: Self-pay | Admitting: Podiatry

## 2022-04-10 DIAGNOSIS — Z794 Long term (current) use of insulin: Secondary | ICD-10-CM | POA: Diagnosis not present

## 2022-04-10 DIAGNOSIS — Z7901 Long term (current) use of anticoagulants: Secondary | ICD-10-CM

## 2022-04-10 DIAGNOSIS — E1121 Type 2 diabetes mellitus with diabetic nephropathy: Secondary | ICD-10-CM

## 2022-04-10 DIAGNOSIS — M79674 Pain in right toe(s): Secondary | ICD-10-CM | POA: Insufficient documentation

## 2022-04-10 DIAGNOSIS — I739 Peripheral vascular disease, unspecified: Secondary | ICD-10-CM | POA: Diagnosis not present

## 2022-04-10 DIAGNOSIS — B351 Tinea unguium: Secondary | ICD-10-CM | POA: Diagnosis not present

## 2022-04-10 DIAGNOSIS — M79675 Pain in left toe(s): Secondary | ICD-10-CM

## 2022-04-10 DIAGNOSIS — N183 Chronic kidney disease, stage 3 unspecified: Secondary | ICD-10-CM

## 2022-04-10 NOTE — Progress Notes (Signed)
This patient returns to my office for at risk foot care.  This patient requires this care by a professional since this patient will be at risk due to having CKD  and Diabetes.This patient presents to the office in a wheelchair and with caregiver.  Patient has not had her nails treated due to Covid.  This patient is unable to cut nails herself since the patient cannot reach her nails.These nails are painful walking and wearing shoes.  This patient presents for at risk foot care today.  General Appearance  Alert, conversant and in no acute stress.  Vascular  Dorsalis pedis and posterior tibial  pulses are  not palpable  bilaterally.  Capillary return is within normal limits  bilaterally. Temperature is within normal limits  bilaterally.  Venous stasis.    Neurologic  Senn-Weinstein monofilament wire test within normal limits  bilaterally. Muscle power within normal limits bilaterally.  Nails Thick disfigured discolored nails with subungual debris  from hallux to fifth toes bilaterally. No evidence of bacterial infection or drainage bilaterally.  Orthopedic  No limitations of motion  feet .  No crepitus or effusions noted.  No bony pathology or digital deformities noted.  Skin  normotropic skin with no porokeratosis noted bilaterally.  No signs of infections or ulcers noted.     Onychomycosis  Pain in right toes  Pain in left toes  Consent was obtained for treatment procedures.   Mechanical debridement of nails 1-5  bilaterally performed with a nail nipper.  Filed with dremel without incident.    Return office visit    3 months                  Told patient to return for periodic foot care and evaluation due to potential at risk complications.   Gardiner Barefoot DPM

## 2022-04-11 ENCOUNTER — Telehealth: Payer: Self-pay

## 2022-04-11 ENCOUNTER — Other Ambulatory Visit: Payer: Self-pay

## 2022-04-11 MED ORDER — AMIODARONE HCL 200 MG PO TABS: ORAL_TABLET | ORAL | 0 refills | Status: DC

## 2022-04-11 NOTE — Telephone Encounter (Signed)
Pt aware will send message to Dr. Lovena Le and his nurse to review/advise. Device clinic reports there has been no afib in a year/more. Pt aware I will forward this to [facility] to determine if still need to take Amiodarone or trial off of it and see how she does -- pt is interested in this if MD agreeable. Aware it may be next week before return call on this.  She will remain off Amiodarone for now.  Patient verbalized understanding and agreeable to plan.

## 2022-04-11 NOTE — Telephone Encounter (Signed)
I am glad to see the patient to discuss her concerns. If she prefers to stop the amiodarone, then she can call us as needed. If she would like to continue amiodarone, she will need to make an appointment to be seen.

## 2022-04-11 NOTE — Telephone Encounter (Signed)
Attempted to reach pt to discuss, received busy signal

## 2022-04-11 NOTE — Telephone Encounter (Signed)
-----   Message from Ron Agee, Sun sent at 04/11/2022  3:05 PM EDT -----  Patient would like call back to discuss amiodarone. She stated she has not been taking it because her pharmacy wont refill it. I asked her if had any at home and she stated no she didn't. I asked her if she wanted me to send in a new prescription and she said no because she hates doctors and she doesn't want to take her medicine. I said can a nurse call you and she said I guess so but they are just stupid like doctors. She agreed to await a callback to see if she would even take the amiodarone anymore. [phone]

## 2022-04-12 NOTE — Telephone Encounter (Signed)
Discussed with pt. Aware I will send this to scheduler who will reach out and schedule routing OV w/ Dr. Lovena Le. Pt understands they will discuss Amiodarone at that [place], until then pt will remain off the medicine and call office if she has any issues. Patient verbalized understanding and agreeable to plan.

## 2022-04-30 ENCOUNTER — Ambulatory Visit: Payer: Medicare Other | Admitting: Internal Medicine

## 2022-04-30 ENCOUNTER — Encounter: Payer: Self-pay | Admitting: Internal Medicine

## 2022-04-30 VITALS — BP 120/66 | HR 67 | Ht 64.0 in

## 2022-04-30 DIAGNOSIS — I4891 Unspecified atrial fibrillation: Secondary | ICD-10-CM

## 2022-04-30 DIAGNOSIS — I5032 Chronic diastolic (congestive) heart failure: Secondary | ICD-10-CM | POA: Diagnosis not present

## 2022-04-30 MED ORDER — AMIODARONE HCL 200 MG PO TABS: ORAL_TABLET | ORAL | 3 refills | Status: DC

## 2022-04-30 MED ORDER — TORSEMIDE 20 MG PO TABS: 20.0000 mg | ORAL_TABLET | Freq: Two times a day (BID) | ORAL | 3 refills | Status: DC

## 2022-05-14 ENCOUNTER — Other Ambulatory Visit: Payer: Medicare Other

## 2022-05-14 DIAGNOSIS — I5032 Chronic diastolic (congestive) heart failure: Secondary | ICD-10-CM

## 2022-05-14 DIAGNOSIS — I4891 Unspecified atrial fibrillation: Secondary | ICD-10-CM

## 2022-05-14 LAB — BASIC METABOLIC PANEL
BUN/Creatinine Ratio: 14 (ref 12–28)
BUN: 35 mg/dL — ABNORMAL HIGH (ref 8–27)
CO2: 21 mmol/L (ref 20–29)
Calcium: 9.2 mg/dL (ref 8.7–10.3)
Chloride: 98 mmol/L (ref 96–106)
Creatinine, Ser: 2.47 mg/dL — ABNORMAL HIGH (ref 0.57–1.00)
Glucose: 121 mg/dL — ABNORMAL HIGH (ref 70–99)
Potassium: 4.3 mmol/L (ref 3.5–5.2)
Sodium: 136 mmol/L (ref 134–144)
eGFR: 19 mL/min/{1.73_m2} — ABNORMAL LOW (ref >59)

## 2022-06-11 ENCOUNTER — Ambulatory Visit (INDEPENDENT_AMBULATORY_CARE_PROVIDER_SITE_OTHER): Payer: Medicare Other

## 2022-06-11 DIAGNOSIS — I495 Sick sinus syndrome: Secondary | ICD-10-CM | POA: Diagnosis not present

## 2022-06-12 LAB — CUP PACEART REMOTE DEVICE CHECK
Battery Remaining Longevity: 102 mo
Battery Voltage: 3 V
Brady Statistic AP VP Percent: 94.71 %
Brady Statistic AP VS Percent: 0 %
Brady Statistic AS VP Percent: 5.29 %
Brady Statistic AS VS Percent: 0.01 %
Brady Statistic RA Percent Paced: 94.67 %
Brady Statistic RV Percent Paced: 99.99 %
Date Time Interrogation Session: 20230807014624
Implantable Lead Implant Date: 20210208
Implantable Lead Implant Date: 20210208
Implantable Lead Location: 753859
Implantable Lead Location: 753860
Implantable Lead Model: 3830
Implantable Lead Model: 5076
Implantable Pulse Generator Implant Date: 20210208
Lead Channel Impedance Value: 380 Ohm
Lead Channel Impedance Value: 380 Ohm
Lead Channel Impedance Value: 437 Ohm
Lead Channel Impedance Value: 494 Ohm
Lead Channel Pacing Threshold Amplitude: 0.5 V
Lead Channel Pacing Threshold Amplitude: 1 V
Lead Channel Pacing Threshold Pulse Width: 0.4 ms
Lead Channel Pacing Threshold Pulse Width: 0.4 ms
Lead Channel Sensing Intrinsic Amplitude: 13.375 mV
Lead Channel Sensing Intrinsic Amplitude: 2 mV
Lead Channel Sensing Intrinsic Amplitude: 2 mV
Lead Channel Setting Pacing Amplitude: 1.5 V
Lead Channel Setting Pacing Amplitude: 2.5 V
Lead Channel Setting Pacing Pulse Width: 0.4 ms
Lead Channel Setting Sensing Sensitivity: 1.2 mV

## 2022-06-23 ENCOUNTER — Other Ambulatory Visit (HOSPITAL_COMMUNITY): Payer: Self-pay | Admitting: Nurse Practitioner

## 2022-06-23 DIAGNOSIS — I4891 Unspecified atrial fibrillation: Secondary | ICD-10-CM

## 2022-06-25 NOTE — Telephone Encounter (Signed)
Xarelto 15mg  refill request received. Pt is 81 years old, weight-88.9kg, Crea-2.47 on 05/14/2022, last seen by Dr. Lovena Le on 04/30/2022, Diagnosis-Afib, CrCl-24.53ml/min; Dose is appropriate based on dosing criteria. Will send in refill to requested pharmacy.

## 2022-07-05 DIAGNOSIS — E039 Hypothyroidism, unspecified: Secondary | ICD-10-CM | POA: Diagnosis not present

## 2022-07-05 DIAGNOSIS — E1121 Type 2 diabetes mellitus with diabetic nephropathy: Secondary | ICD-10-CM | POA: Diagnosis not present

## 2022-07-05 DIAGNOSIS — N184 Chronic kidney disease, stage 4 (severe): Secondary | ICD-10-CM | POA: Diagnosis not present

## 2022-07-05 DIAGNOSIS — I890 Lymphedema, not elsewhere classified: Secondary | ICD-10-CM | POA: Diagnosis not present

## 2022-07-05 DIAGNOSIS — I129 Hypertensive chronic kidney disease with stage 1 through stage 4 chronic kidney disease, or unspecified chronic kidney disease: Secondary | ICD-10-CM | POA: Diagnosis not present

## 2022-07-10 ENCOUNTER — Ambulatory Visit: Payer: Medicare Other | Admitting: Podiatry

## 2022-07-12 NOTE — Progress Notes (Signed)
Remote pacemaker transmission.   

## 2022-07-25 DIAGNOSIS — D631 Anemia in chronic kidney disease: Secondary | ICD-10-CM | POA: Diagnosis not present

## 2022-07-25 DIAGNOSIS — N2581 Secondary hyperparathyroidism of renal origin: Secondary | ICD-10-CM | POA: Diagnosis not present

## 2022-07-25 DIAGNOSIS — I129 Hypertensive chronic kidney disease with stage 1 through stage 4 chronic kidney disease, or unspecified chronic kidney disease: Secondary | ICD-10-CM | POA: Diagnosis not present

## 2022-07-25 DIAGNOSIS — N184 Chronic kidney disease, stage 4 (severe): Secondary | ICD-10-CM | POA: Diagnosis not present

## 2022-07-25 DIAGNOSIS — R609 Edema, unspecified: Secondary | ICD-10-CM | POA: Diagnosis not present

## 2022-07-25 DIAGNOSIS — E559 Vitamin D deficiency, unspecified: Secondary | ICD-10-CM | POA: Diagnosis not present

## 2022-08-03 DIAGNOSIS — I129 Hypertensive chronic kidney disease with stage 1 through stage 4 chronic kidney disease, or unspecified chronic kidney disease: Secondary | ICD-10-CM | POA: Diagnosis not present

## 2022-08-03 DIAGNOSIS — E1169 Type 2 diabetes mellitus with other specified complication: Secondary | ICD-10-CM | POA: Diagnosis not present

## 2022-08-03 DIAGNOSIS — Z23 Encounter for immunization: Secondary | ICD-10-CM | POA: Diagnosis not present

## 2022-08-20 DIAGNOSIS — N184 Chronic kidney disease, stage 4 (severe): Secondary | ICD-10-CM | POA: Diagnosis not present

## 2022-08-23 DIAGNOSIS — I890 Lymphedema, not elsewhere classified: Secondary | ICD-10-CM | POA: Diagnosis not present

## 2022-09-10 ENCOUNTER — Ambulatory Visit (INDEPENDENT_AMBULATORY_CARE_PROVIDER_SITE_OTHER): Payer: Medicare Other

## 2022-09-10 DIAGNOSIS — I495 Sick sinus syndrome: Secondary | ICD-10-CM | POA: Diagnosis not present

## 2022-09-10 DIAGNOSIS — Z950 Presence of cardiac pacemaker: Secondary | ICD-10-CM | POA: Diagnosis not present

## 2022-09-11 LAB — CUP PACEART REMOTE DEVICE CHECK
Battery Remaining Longevity: 100 mo
Battery Voltage: 3 V
Brady Statistic AP VP Percent: 93.74 %
Brady Statistic AP VS Percent: 0 %
Brady Statistic AS VP Percent: 6.26 %
Brady Statistic AS VS Percent: 0.01 %
Brady Statistic RA Percent Paced: 93.59 %
Brady Statistic RV Percent Paced: 99.99 %
Date Time Interrogation Session: 20231106030852
Implantable Lead Connection Status: 753985
Implantable Lead Connection Status: 753985
Implantable Lead Implant Date: 20210208
Implantable Lead Implant Date: 20210208
Implantable Lead Location: 753859
Implantable Lead Location: 753860
Implantable Lead Model: 3830
Implantable Lead Model: 5076
Implantable Pulse Generator Implant Date: 20210208
Lead Channel Impedance Value: 380 Ohm
Lead Channel Impedance Value: 380 Ohm
Lead Channel Impedance Value: 456 Ohm
Lead Channel Impedance Value: 494 Ohm
Lead Channel Pacing Threshold Amplitude: 0.625 V
Lead Channel Pacing Threshold Amplitude: 1 V
Lead Channel Pacing Threshold Pulse Width: 0.4 ms
Lead Channel Pacing Threshold Pulse Width: 0.4 ms
Lead Channel Sensing Intrinsic Amplitude: 13.375 mV
Lead Channel Sensing Intrinsic Amplitude: 2.125 mV
Lead Channel Sensing Intrinsic Amplitude: 2.125 mV
Lead Channel Setting Pacing Amplitude: 1.5 V
Lead Channel Setting Pacing Amplitude: 2.5 V
Lead Channel Setting Pacing Pulse Width: 0.4 ms
Lead Channel Setting Sensing Sensitivity: 1.2 mV
Zone Setting Status: 755011
Zone Setting Status: 755011

## 2022-10-01 DIAGNOSIS — E7800 Pure hypercholesterolemia, unspecified: Secondary | ICD-10-CM | POA: Diagnosis not present

## 2022-10-01 DIAGNOSIS — J449 Chronic obstructive pulmonary disease, unspecified: Secondary | ICD-10-CM | POA: Diagnosis not present

## 2022-10-01 DIAGNOSIS — I429 Cardiomyopathy, unspecified: Secondary | ICD-10-CM | POA: Diagnosis not present

## 2022-10-01 DIAGNOSIS — Z23 Encounter for immunization: Secondary | ICD-10-CM | POA: Diagnosis not present

## 2022-10-01 DIAGNOSIS — I4891 Unspecified atrial fibrillation: Secondary | ICD-10-CM | POA: Diagnosis not present

## 2022-10-01 DIAGNOSIS — E213 Hyperparathyroidism, unspecified: Secondary | ICD-10-CM | POA: Diagnosis not present

## 2022-10-01 DIAGNOSIS — N184 Chronic kidney disease, stage 4 (severe): Secondary | ICD-10-CM | POA: Diagnosis not present

## 2022-10-01 DIAGNOSIS — E1121 Type 2 diabetes mellitus with diabetic nephropathy: Secondary | ICD-10-CM | POA: Diagnosis not present

## 2022-10-01 DIAGNOSIS — I129 Hypertensive chronic kidney disease with stage 1 through stage 4 chronic kidney disease, or unspecified chronic kidney disease: Secondary | ICD-10-CM | POA: Diagnosis not present

## 2022-10-01 DIAGNOSIS — E039 Hypothyroidism, unspecified: Secondary | ICD-10-CM | POA: Diagnosis not present

## 2022-10-01 DIAGNOSIS — I700 Atherosclerosis of aorta: Secondary | ICD-10-CM | POA: Diagnosis not present

## 2022-10-01 DIAGNOSIS — Z0000 Encounter for general adult medical examination without abnormal findings: Secondary | ICD-10-CM | POA: Diagnosis not present

## 2022-10-12 DIAGNOSIS — Z1231 Encounter for screening mammogram for malignant neoplasm of breast: Secondary | ICD-10-CM | POA: Diagnosis not present

## 2022-10-12 NOTE — Progress Notes (Signed)
Remote pacemaker transmission.   

## 2022-10-30 DIAGNOSIS — N184 Chronic kidney disease, stage 4 (severe): Secondary | ICD-10-CM | POA: Diagnosis not present

## 2022-10-30 DIAGNOSIS — E559 Vitamin D deficiency, unspecified: Secondary | ICD-10-CM | POA: Diagnosis not present

## 2022-12-07 ENCOUNTER — Other Ambulatory Visit: Payer: Self-pay | Admitting: Internal Medicine

## 2022-12-07 DIAGNOSIS — I4891 Unspecified atrial fibrillation: Secondary | ICD-10-CM

## 2022-12-07 MED ORDER — RIVAROXABAN 15 MG PO TABS: ORAL_TABLET | ORAL | 1 refills | Status: AC

## 2022-12-07 MED ORDER — TORSEMIDE 20 MG PO TABS: 20.0000 mg | ORAL_TABLET | Freq: Two times a day (BID) | ORAL | 1 refills | Status: DC

## 2022-12-07 MED ORDER — AMIODARONE HCL 200 MG PO TABS: ORAL_TABLET | ORAL | 1 refills | Status: AC

## 2022-12-07 NOTE — Telephone Encounter (Signed)
Pt's medications were sent to pt's pharmacy as requested. Confirmation received.  

## 2022-12-07 NOTE — Telephone Encounter (Signed)
Xarelto 15mg  refill request received. Pt is 82 years old, weight-88.9kg, Crea-2.47 on 05/14/22, last seen by Dr. Cristopher Peru on 04/30/2022, Diagnosis-Afib, CrCl-24.22 mL/min; Dose is appropriate based on dosing criteria. Will send in refill to requested pharmacy.

## 2022-12-07 NOTE — Telephone Encounter (Signed)
*  STAT* If patient is at the pharmacy, call can be transferred to refill team.   1. Which medications need to be refilled? (please list name of each medication and dose if known)   amiodarone (PACERONE) 200 MG table    torsemide (DEMADEX) 20 MG tablet  Rivaroxaban (XARELTO) 15 MG TABS tablet   2. Which pharmacy/location (including street and city if local pharmacy) is medication to be sent to?  [facility] - [place], [place] - [place] [address]    3. Do they need a 30 day or 90 day supply?  90 day

## 2022-12-10 ENCOUNTER — Ambulatory Visit: Payer: Medicare Other

## 2022-12-10 DIAGNOSIS — J449 Chronic obstructive pulmonary disease, unspecified: Secondary | ICD-10-CM | POA: Diagnosis not present

## 2022-12-10 DIAGNOSIS — I4891 Unspecified atrial fibrillation: Secondary | ICD-10-CM | POA: Diagnosis not present

## 2022-12-10 DIAGNOSIS — E7800 Pure hypercholesterolemia, unspecified: Secondary | ICD-10-CM | POA: Diagnosis not present

## 2022-12-10 DIAGNOSIS — I129 Hypertensive chronic kidney disease with stage 1 through stage 4 chronic kidney disease, or unspecified chronic kidney disease: Secondary | ICD-10-CM | POA: Diagnosis not present

## 2022-12-10 DIAGNOSIS — N184 Chronic kidney disease, stage 4 (severe): Secondary | ICD-10-CM | POA: Diagnosis not present

## 2022-12-10 DIAGNOSIS — I495 Sick sinus syndrome: Secondary | ICD-10-CM

## 2022-12-10 DIAGNOSIS — E1121 Type 2 diabetes mellitus with diabetic nephropathy: Secondary | ICD-10-CM | POA: Diagnosis not present

## 2022-12-10 DIAGNOSIS — E039 Hypothyroidism, unspecified: Secondary | ICD-10-CM | POA: Diagnosis not present

## 2022-12-11 LAB — CUP PACEART REMOTE DEVICE CHECK
Battery Remaining Longevity: 98 mo
Battery Voltage: 3 V
Brady Statistic AP VP Percent: 93.38 %
Brady Statistic AP VS Percent: 0 %
Brady Statistic AS VP Percent: 6.62 %
Brady Statistic AS VS Percent: 0.01 %
Brady Statistic RA Percent Paced: 93.07 %
Brady Statistic RV Percent Paced: 99.99 %
Date Time Interrogation Session: 20240204195458
Implantable Lead Connection Status: 753985
Implantable Lead Connection Status: 753985
Implantable Lead Implant Date: 20210208
Implantable Lead Implant Date: 20210208
Implantable Lead Location: 753859
Implantable Lead Location: 753860
Implantable Lead Model: 3830
Implantable Lead Model: 5076
Implantable Pulse Generator Implant Date: 20210208
Lead Channel Impedance Value: 380 Ohm
Lead Channel Impedance Value: 399 Ohm
Lead Channel Impedance Value: 456 Ohm
Lead Channel Impedance Value: 532 Ohm
Lead Channel Pacing Threshold Amplitude: 0.625 V
Lead Channel Pacing Threshold Amplitude: 1 V
Lead Channel Pacing Threshold Pulse Width: 0.4 ms
Lead Channel Pacing Threshold Pulse Width: 0.4 ms
Lead Channel Sensing Intrinsic Amplitude: 13.375 mV
Lead Channel Sensing Intrinsic Amplitude: 2.25 mV
Lead Channel Sensing Intrinsic Amplitude: 2.25 mV
Lead Channel Setting Pacing Amplitude: 1.5 V
Lead Channel Setting Pacing Amplitude: 2.5 V
Lead Channel Setting Pacing Pulse Width: 0.4 ms
Lead Channel Setting Sensing Sensitivity: 1.2 mV
Zone Setting Status: 755011
Zone Setting Status: 755011

## 2022-12-13 ENCOUNTER — Encounter (HOSPITAL_COMMUNITY): Payer: Self-pay | Admitting: *Deleted

## 2022-12-19 ENCOUNTER — Telehealth: Payer: Self-pay | Admitting: Internal Medicine

## 2022-12-19 NOTE — Telephone Encounter (Signed)
Left message to call the clinic. 

## 2022-12-19 NOTE — Telephone Encounter (Signed)
Pt c/o medication issue:  1. Name of Medication:   torsemide (DEMADEX) 20 MG tablet    2. How are you currently taking this medication (dosage and times per day)?   Take 1 tablet (20 mg total) by mouth 2 (two) times daily.    3. Are you having a reaction (difficulty breathing--STAT)? No  4. What is your medication issue? Office needing clarification how instructions. Pt  states that dosage was increased. They would like a callback regarding this matter

## 2022-12-20 NOTE — Telephone Encounter (Signed)
Ronny Bacon is returning LPN's call. She is requesting a VM be left confirming if medication was increased or not if they do not answer callback. Please advise.

## 2022-12-20 NOTE — Telephone Encounter (Signed)
Left message for Lydia Myers informing her current prescribed dose of torsemide is 32m, 1 tablet by mouth twice daily.  Provided office number for callback if any questions.

## 2022-12-25 DIAGNOSIS — I429 Cardiomyopathy, unspecified: Secondary | ICD-10-CM | POA: Diagnosis not present

## 2022-12-25 DIAGNOSIS — I509 Heart failure, unspecified: Secondary | ICD-10-CM | POA: Diagnosis not present

## 2022-12-25 DIAGNOSIS — E1169 Type 2 diabetes mellitus with other specified complication: Secondary | ICD-10-CM | POA: Diagnosis not present

## 2022-12-25 DIAGNOSIS — I890 Lymphedema, not elsewhere classified: Secondary | ICD-10-CM | POA: Diagnosis not present

## 2022-12-25 DIAGNOSIS — E559 Vitamin D deficiency, unspecified: Secondary | ICD-10-CM | POA: Diagnosis not present

## 2022-12-25 DIAGNOSIS — L8990 Pressure ulcer of unspecified site, unspecified stage: Secondary | ICD-10-CM | POA: Diagnosis not present

## 2023-01-03 ENCOUNTER — Telehealth: Payer: Self-pay | Admitting: Internal Medicine

## 2023-01-03 NOTE — Telephone Encounter (Signed)
Pt called per message received from [facility] Triage.    Pt states she spoke with her PCP, and has bilateral lower extremity edema.  PCP wanted her seen at [facility], but gave her directions to take torsemide to help with bilateral lower extremity edema.    Since Dr. Tanna Furry schedule was full the next few weeks with him leaving for vacation, an appointment with Ms. Nicholes Rough PA-C was obtained for 01/10/2023 at 825 am, to address this ongoing edema concern, and assess PMH of CHF.   Pt called back and made aware of appointment scheduled as stated above, on 01/03/2023.  Pt has 1 week to obtain a ride for this appointment.  Pt verbalized understanding, and appreciated the appointment with [facility].

## 2023-01-03 NOTE — Telephone Encounter (Signed)
Additional note:  Pt told to go to the nearest ER for care if she becomes short of breath, or has any other cardiac symptoms prior to her 01/10/23 appointment.    Pt verbalized understanding to this order.

## 2023-01-03 NOTE — Telephone Encounter (Signed)
Pt c/o medication issue:  1. Name of Medication:   torsemide (DEMADEX) 20 MG tablet    2. How are you currently taking this medication (dosage and times per day)? ake 1 tablet (20 mg tTake 1 tablet (20 mg total) by mouth 2 (two) times daily. otal) by mouth 2 (two) times daily.   3. Are you having a reaction (difficulty breathing--STAT)? no  4. What is your medication issue? Pt's PCP calling in regards to medication increase due to pt swelling. Office states that pt has had increased swelling to the point she is not able to put on pants. Office as well as pt would like a callback regarding this matter. Please advise

## 2023-01-07 ENCOUNTER — Telehealth: Payer: Self-pay | Admitting: Internal Medicine

## 2023-01-07 NOTE — Telephone Encounter (Signed)
Lydia Myers is calling from [facility] requesting any medication changes made on 03/07 during patient's appt be sent to [facility]. Please advise.

## 2023-01-09 ENCOUNTER — Telehealth: Payer: Self-pay | Admitting: Internal Medicine

## 2023-01-09 NOTE — Progress Notes (Unsigned)
Office Visit    Patient Name: Lydia Myers Date of Encounter: 01/10/2023  PCP:  Collene Leyden, [place]  Cardiologist:  Werner Lean, MD  Advanced Practice Provider:  No care team member to display Electrophysiologist:  Cristopher Peru, MD   HPI    Lydia Myers is a 82 y.o. female with a past medical history unknown for CHB status post PPM insertion underwent DDD PPM insertion , hypertension, hyperlipidemia presents today for follow-up appointment.  She was last seen by Dr. Lovena Le 04/30/2022 and at that time had done well but was still having shortness of breath and lower extremity edema.  No syncope or palpitations.  She comes in today and is having some LE edema with venous insuff changes. She has a new ulcer at the back of her left leg which is open and draining. The drainage has a smell to it. She did have a renal doctor but stopped going. We discussed her re-establishing care with a nephrologist. She does have a little SOB but the swelling is most bothersome. She has not been able to put on compression hose to help. She is encouraged to elevate her legs if able. She lives by herself but her sister helps when she can. We discussed elastic therapy and custom compression hose. We will also plan to consult wound care for her venous ulcer as well as vascular. She will need to get her medications sorted as well since she is unsure what she is taking. She knows she is on the blood thinner, her insulin, and most  of her heart medications. Discussed starting abx with Lydia Myers, PharmD.  I will also reach out to [facility] to see if we can get her home situation sorted.   Reports no shortness of breath nor dyspnea on exertion. Reports no chest pain, pressure, or tightness. No edema, orthopnea, PND. Reports no palpitations.   Consults today:  Wound care for venous insuff ulcer Nephrology to re-establish care Vascular surgery PCP-follow-up in 1 week PharmD  for medication management Social Lydia Myers  Past Medical History    Past Medical History:  Diagnosis Date   Arthritis    Asthma    Cardiomyopathy ([place])    COPD (chronic obstructive pulmonary disease) ([place])    Diabetes mellitus    Glaucoma    High cholesterol    Hypertension    Systolic dysfunction, left ventricle    Past Surgical History:  Procedure Laterality Date   A-FLUTTER ABLATION N/A 12/09/2019   Procedure: A-FLUTTER ABLATION;  Surgeon: Evans Lance, MD;  Location: [facility];  Service: Cardiovascular;  Laterality: N/A;   ABDOMINAL HYSTERECTOMY     AV NODE ABLATION N/A 12/14/2019   Procedure: AV NODE ABLATION;  Surgeon: Evans Lance, MD;  Location: [facility];  Service: Cardiovascular;  Laterality: N/A;   HIP SURGERY     JOINT REPLACEMENT     left lumpectomy     PACEMAKER IMPLANT N/A 12/14/2019   Procedure: PACEMAKER IMPLANT;  Surgeon: Evans Lance, MD;  Location: [facility];  Service: Cardiovascular;  Laterality: N/A;   REPLACEMENT TOTAL KNEE BILATERAL     2009-Right, 2001-Left   Right bog toe fusion     Right trigger finger surgery      Allergies  Allergies  Allergen Reactions   Shrimp [Shellfish Allergy] Nausea And Vomiting   Shellfish-Derived Products     Other reaction(s): Unknown  EKGs/Labs/Other Studies Reviewed:   The following studies were reviewed today:  Echocardiogram 06/16/21  IMPRESSIONS     1. Left ventricular ejection fraction, by estimation, is 60 to 65%. The  left ventricle has normal function. The left ventricle has no regional  wall motion abnormalities. Left ventricular diastolic parameters are  indeterminate.   2. Right ventricular systolic function is normal. The right ventricular  size is normal. There is mildly elevated pulmonary artery systolic  pressure.   3. Left atrial size was moderately dilated.   4. Right atrial size was moderately dilated.   5. The mitral valve is normal in  structure. Trivial mitral valve  regurgitation.   6. The aortic valve is tricuspid. There is mild calcification of the  aortic valve. Aortic valve regurgitation is not visualized. Mild aortic  valve sclerosis is present, with no evidence of aortic valve stenosis.   7. The inferior vena cava is normal in size with greater than 50%  respiratory variability, suggesting right atrial pressure of 3 mmHg.   Comparison(s): Changes from prior study are noted.   Conclusion(s)/Recommendation(s): EF appears improved compared to prior.   FINDINGS   Left Ventricle: Left ventricular ejection fraction, by estimation, is 60  to 65%. The left ventricle has normal function. The left ventricle has no  regional wall motion abnormalities. The left ventricular internal cavity  size was normal in size. There is   no left ventricular hypertrophy. Abnormal (paradoxical) septal motion,  consistent with RV pacemaker. Left ventricular diastolic parameters are  indeterminate.   Right Ventricle: The right ventricular size is normal. Right vetricular  wall thickness was not well visualized. Right ventricular systolic  function is normal. There is mildly elevated pulmonary artery systolic  pressure. The tricuspid regurgitant velocity   is 3.11 m/s, and with an assumed right atrial pressure of 3 mmHg, the  estimated right ventricular systolic pressure is [id number] mmHg.   Left Atrium: Left atrial size was moderately dilated.   Right Atrium: Right atrial size was moderately dilated.   Pericardium: Trivial pericardial effusion is present.   Mitral Valve: The mitral valve is normal in structure. Trivial mitral  valve regurgitation.   Tricuspid Valve: The tricuspid valve is normal in structure. Tricuspid  valve regurgitation is trivial.   Aortic Valve: The aortic valve is tricuspid. There is mild calcification  of the aortic valve. Aortic valve regurgitation is not visualized. Mild  aortic valve sclerosis is present,  with no evidence of aortic valve  stenosis.   Pulmonic Valve: The pulmonic valve was not well visualized. Pulmonic valve  regurgitation is not visualized.   Aorta: The aortic root and ascending aorta are structurally normal, with  no evidence of dilitation.   Venous: The inferior vena cava is normal in size with greater than 50%  respiratory variability, suggesting right atrial pressure of 3 mmHg.   IAS/Shunts: The atrial septum is grossly normal.   Additional Comments: A pacer wire is visualized in the right atrium and  right ventricle.     LEFT VENTRICLE  PLAX 2D  LVIDd:         4.10 cm Diastology  LVIDs:         2.70 cm LV e' medial:    6.42 cm/s  LV PW:         1.00 cm LV E/e' medial:  12.3  LV IVS:        1.10 cm LV e' lateral:   8.16 cm/s  LV E/e' lateral: 9.7     RIGHT VENTRICLE             IVC  RV S prime:     12.50 cm/s  IVC diam: 1.80 cm  TAPSE (M-mode): 1.9 cm   LEFT ATRIUM             Index       RIGHT ATRIUM           Index  LA diam:        3.80 cm 2.03 cm/m  RA Area:     22.50 cm  LA Vol (A2C):   49.1 ml 26.25 ml/m RA Volume:   65.50 ml  35.01 ml/m  LA Vol (A4C):   67.4 ml 36.03 ml/m  LA Biplane Vol: 63.3 ml 33.84 ml/m   AORTIC VALVE  LVOT Vmax:   106.00 cm/s  LVOT Vmean:  65.100 cm/s  LVOT VTI:    0.248 m    AORTA  Ao Asc diam: 3.10 cm     EKG:  EKG is not ordered today.   Recent Labs: 05/14/2022: BUN 35; Creatinine, Ser 2.47; Potassium 4.3; Sodium 136  Recent Lipid Panel    Component Value Date/Time   CHOL 146 07/02/2017 0238   TRIG 52 07/02/2017 0238   HDL 67 07/02/2017 0238   CHOLHDL 2.2 07/02/2017 0238   VLDL 10 07/02/2017 0238   LDLCALC 69 07/02/2017 0238    Home Medications   Current Meds  Medication Sig   ACCU-CHEK GUIDE test strip USE TO CHECK FINGER STICK BLOOD SUGAR TWICE DAILY   Accu-Chek Softclix Lancets lancets 2 (two) times daily.   acetaminophen (TYLENOL) 325 MG tablet Take 1-2 tablets  (325-650 mg total) by mouth every 4 (four) hours as needed for mild pain.   ALPRAZolam (XANAX) 0.25 MG tablet Take 0.25 mg by mouth daily as needed.   amiodarone (PACERONE) 200 MG tablet Take one tablet by mouth Monday through Saturday. DO NOT TAKE on SUNDAY.   atorvastatin (LIPITOR) 10 MG tablet TAKE 1 TABLET BY MOUTH EVERY DAY AT 6PM   AZOPT 1 % ophthalmic suspension Place 1 drop into both eyes 2 (two) times daily.   BD PEN NEEDLE NANO 2ND GEN 32G X 4 MM MISC USE ONCE A DAY WITH BASAGLAR KWIK PEN   Blood Glucose Monitoring Suppl (ACCU-CHEK GUIDE) w/Device KIT USE TO CHECK BLOOD SUGAR 2 TIMES A DAY   doxycycline (DORYX) 100 MG EC tablet Take 1 tablet (100 mg total) by mouth 2 (two) times daily.   Lactobacillus-Inulin (CULTURELLE DIGESTIVE DAILY) CAPS Take one capsule twice daily for 21 days.   LEVEMIR FLEXTOUCH 100 UNIT/ML FlexPen Inject 100 mLs into the skin daily.   levothyroxine (SYNTHROID) 88 MCG tablet Take 88 mcg by mouth every morning.   losartan (COZAAR) 25 MG tablet Take 1 tablet by mouth daily.   metoprolol succinate (TOPROL-XL) 25 MG 24 hr tablet Take 1 tablet by mouth daily.   Miconazole Nitrate 2 % AERP Spray on plantar aspect of left foot once daily   olmesartan (BENICAR) 40 MG tablet Take 40 mg by mouth daily.   Rivaroxaban (XARELTO) 15 MG TABS tablet TAKE 1 TABLET BY MOUTH EVERY DAY WITH DINNER   sitaGLIPtin (JANUVIA) 100 MG tablet 1 tablet   torsemide (DEMADEX) 20 MG tablet Take 1 tablet (20 mg total) by mouth 2 (two) times daily.   TRAVATAN Z 0.004 % SOLN ophthalmic solution Place 1 drop into both eyes every evening.  Review of Systems      All other systems reviewed and are otherwise negative except as noted above.  Physical Exam    VS:  BP (!) 122/58   Pulse 62   Ht '5\' 4"'$  (1.626 m)   SpO2 95%   BMI 33.64 kg/m  , BMI Body mass index is 33.64 kg/m.  Wt Readings from Last 3 Encounters:  04/04/21 196 lb (88.9 kg)  08/02/20 191 lb 9.6 oz (86.9 kg)  07/07/20  180 lb (81.6 kg)     GEN: Well nourished, well developed, in no acute distress. HEENT: normal. Neck: Supple, no JVD, carotid bruits, or masses. Cardiac: RRR, no murmurs, rubs, or gallops. No clubbing, cyanosis, 2-3 + edema with venous stasis changes, granulations, + ulcer left back of the LL, open and draining, + odor.  Radials/PT 2+ and equal bilaterally.  Respiratory:  Respirations regular and unlabored, clear to auscultation bilaterally. GI: Soft, nontender, nondistended. MS: No deformity or atrophy. Skin: Warm and dry, no rash. Neuro:  Strength and sensation are intact. Psych: Normal affect.  Assessment & Plan    CHB status post PPM -stable  Chronic diastolic heart failure -update echo  Peripheral edema/venous insuff with ulcer and infection -purulent draining left ulcer, started on Doxycycline '100mg'$  BID x 10 days -close follow-up 1 week with PCP, appointment scheduled today -handout for [place] elastic therapy  -consult to wound care -consult to vascular surgery   PAF -continue Amio and xarelto  -NSR today -scheduled follow-up with Dr. Lovena Le  5. Chronic renal insuff -refer back to nephrology  -creatinine 2.7      Disposition: Follow up 3 months with Werner Lean, MD or APP.  Signed, Elgie Collard, PA-C 01/10/2023, 10:27 AM [place]

## 2023-01-09 NOTE — Telephone Encounter (Signed)
Per call received, Pt pharmacy listed is [facility].   Called Mickel Baas with [facility], and made her aware of Pt 01/10/2023 with Ms. Lindell Spar, and that we did have [facility] listed as Pt pharmacy.  Ms. Mickel Baas also told, that Pt was advised to go to nearest ER for her symptoms, or if symptoms worsened between time of call, and appointment with Ms. [employer].    No follow up required at this time.

## 2023-01-09 NOTE — Telephone Encounter (Signed)
Returned call to [employer] [facility] physicians. Left voicemail message, for Ronny Bacon; Pt is coming into [facility] tomorrow for clinic visit.  Unable to speak with [facility].

## 2023-01-09 NOTE — Telephone Encounter (Signed)
Lydia Myers, from [place] returning [employer].

## 2023-01-10 ENCOUNTER — Encounter: Payer: Self-pay | Admitting: Physician Assistant

## 2023-01-10 ENCOUNTER — Telehealth: Payer: Self-pay | Admitting: Physician Assistant

## 2023-01-10 ENCOUNTER — Telehealth (HOSPITAL_BASED_OUTPATIENT_CLINIC_OR_DEPARTMENT_OTHER): Payer: Self-pay | Admitting: Physician Assistant

## 2023-01-10 ENCOUNTER — Telehealth: Payer: Self-pay | Admitting: *Deleted

## 2023-01-10 ENCOUNTER — Ambulatory Visit: Payer: Medicare Other | Attending: Physician Assistant | Admitting: Physician Assistant

## 2023-01-10 VITALS — BP 122/58 | HR 62 | Ht 64.0 in

## 2023-01-10 DIAGNOSIS — Z950 Presence of cardiac pacemaker: Secondary | ICD-10-CM

## 2023-01-10 DIAGNOSIS — R600 Localized edema: Secondary | ICD-10-CM | POA: Diagnosis not present

## 2023-01-10 DIAGNOSIS — I4891 Unspecified atrial fibrillation: Secondary | ICD-10-CM

## 2023-01-10 DIAGNOSIS — I5032 Chronic diastolic (congestive) heart failure: Secondary | ICD-10-CM

## 2023-01-10 DIAGNOSIS — N183 Chronic kidney disease, stage 3 unspecified: Secondary | ICD-10-CM | POA: Diagnosis not present

## 2023-01-10 DIAGNOSIS — Z79899 Other long term (current) drug therapy: Secondary | ICD-10-CM | POA: Diagnosis not present

## 2023-01-10 MED ORDER — DOXYCYCLINE HYCLATE 100 MG PO TBEC: 100.0000 mg | DELAYED_RELEASE_TABLET | Freq: Two times a day (BID) | ORAL | 0 refills | Status: DC

## 2023-01-10 NOTE — Telephone Encounter (Signed)
Pt c/o medication issue:  1. Name of Medication: doxycycline (DORYX) 100 MG EC tablet   2. How are you currently taking this medication (dosage and times per day)? doxycycline (DORYX) 100 MG EC tablet   3. Are you having a reaction (difficulty breathing--STAT)? No  4. What is your medication issue? Pharmacy states that they don't have medication above in stock, would like to know if another form of medication can be sent prescribed and sent over. Please advise.

## 2023-01-10 NOTE — Telephone Encounter (Signed)
A message was left [phone] her appointment with the Pharm D.

## 2023-01-10 NOTE — Telephone Encounter (Signed)
Patient was contacted to let patient know  Provider was able to get Pcp appointment. Patient was very disgruntle and used vulgar language with refusing care recommendations.Patient stated she doesn't wasn't to come back this clinic ever again. Patient was very disgruntle and impatient during visit also. Patient was told to have a good rest of the day and the phone was hung up.

## 2023-01-10 NOTE — Patient Instructions (Addendum)
Medication Instructions:   FOR 10 DAYS ONLY: TAKE DOXYCYLINE 100 TWICE A DAY    *If you need a refill on your cardiac medications before your next appointment, please call your pharmacy*   Lab Work:  NONE ORDERED  TODAY   If you have labs (blood work) drawn today and your tests are completely normal, you will receive your results only by: [place] (if you have MyChart) OR A paper copy in the mail If you have any lab test that is abnormal or we need to change your treatment, we will call you to review the results.   Testing/Procedures: Your physician has requested that you have an echocardiogram. Echocardiography is a painless test that uses sound waves to create images of your heart. It provides your doctor with information about the size and shape of your heart and how well your heart's chambers and valves are working. This procedure takes approximately one hour. There are no restrictions for this procedure. Please do NOT wear cologne, perfume, aftershave, or lotions (deodorant is allowed). Please arrive 15 minutes prior to your appointment time.     Follow-Up: At [facility], you and your health needs are our priority.  As part of our continuing mission to provide you with exceptional heart care, we have created designated Provider Care Teams.  These Care Teams include your primary Cardiologist (physician) and Advanced Practice Providers (APPs -  Physician Assistants and Nurse Practitioners) who all work together to provide you with the care you need, when you need it.  We recommend signing up for the patient portal called "MyChart".  Sign up information is provided on this After Visit Summary.  MyChart is used to connect with patients for Virtual Visits (Telemedicine).  Patients are able to view lab/test results, encounter notes, upcoming appointments, etc.  Non-urgent messages can be sent to your provider as well.   To learn more about what you can do with MyChart, go  to [email or web address].    Your next appointment:  3 MONTHS WITH DR Knox Saliva need to Pharm D appointment : NEXT AVAILABLE    You have been referred to : Vascular Surgery   You have been referred to : WOUND CARE NURSE FOR WOUND DRESSING   YOU NEED FOLLOW UP WITH PCP IN A WEEK     Provider:       Other Instructions  Low-Sodium Eating Plan Sodium, which is an element that makes up salt, helps you maintain a healthy balance of fluids in your body. Too much sodium can increase your blood pressure and cause fluid and waste to be held in your body. Your health care provider or dietitian may recommend following this plan if you have high blood pressure (hypertension), kidney disease, liver disease, or heart failure. Eating less sodium can help lower your blood pressure, reduce swelling, and protect your heart, liver, and kidneys. What are tips for following this plan? Reading food labels The Nutrition Facts label lists the amount of sodium in one serving of the food. If you eat more than one serving, you must multiply the listed amount of sodium by the number of servings. Choose foods with less than 140 mg of sodium per serving. Avoid foods with 300 mg of sodium or more per serving. Shopping  Look for lower-sodium products, often labeled as "low-sodium" or "no salt added." Always check the sodium content, even if foods are labeled as "unsalted" or "no salt added." Buy fresh foods. Avoid canned foods and  pre-made or frozen meals. Avoid canned, cured, or processed meats. Buy breads that have less than 80 mg of sodium per slice. Cooking  Eat more home-cooked food and less restaurant, buffet, and fast food. Avoid adding salt when cooking. Use salt-free seasonings or herbs instead of table salt or sea salt. Check with your health care provider or pharmacist before using salt substitutes. Cook with plant-based oils, such as canola, sunflower, or olive oil. Meal planning When eating  at a restaurant, ask that your food be prepared with less salt or no salt, if possible. Avoid dishes labeled as brined, pickled, cured, smoked, or made with soy sauce, miso, or teriyaki sauce. Avoid foods that contain MSG (monosodium glutamate). MSG is sometimes added to [place] food, bouillon, and some canned foods. Make meals that can be grilled, baked, poached, roasted, or steamed. These are generally made with less sodium. General information Most people on this plan should limit their sodium intake to 1,500-2,000 mg (milligrams) of sodium each day. What foods should I eat? Fruits Fresh, frozen, or canned fruit. Fruit juice. Vegetables Fresh or frozen vegetables. "No salt added" canned vegetables. "No salt added" tomato sauce and paste. Low-sodium or reduced-sodium tomato and vegetable juice. Grains Low-sodium cereals, including oats, puffed wheat and rice, and shredded wheat. Low-sodium crackers. Unsalted rice. Unsalted pasta. Low-sodium bread. Whole-grain breads and whole-grain pasta. Meats and other proteins Fresh or frozen (no salt added) meat, poultry, seafood, and fish. Low-sodium canned tuna and salmon. Unsalted nuts. Dried peas, beans, and lentils without added salt. Unsalted canned beans. Eggs. Unsalted nut butters. Dairy Milk. Soy milk. Cheese that is naturally low in sodium, such as ricotta cheese, fresh mozzarella, or Swiss cheese. Low-sodium or reduced-sodium cheese. Cream cheese. Yogurt. Seasonings and condiments Fresh and dried herbs and spices. Salt-free seasonings. Low-sodium mustard and ketchup. Sodium-free salad dressing. Sodium-free light mayonnaise. Fresh or refrigerated horseradish. Lemon juice. Vinegar. Other foods Homemade, reduced-sodium, or low-sodium soups. Unsalted popcorn and pretzels. Low-salt or salt-free chips. The items listed above may not be a complete list of foods and beverages you can eat. Contact a dietitian for more information. What foods should I  avoid? Vegetables Sauerkraut, pickled vegetables, and relishes. Olives. [place] fries. Onion rings. Regular canned vegetables (not low-sodium or reduced-sodium). Regular canned tomato sauce and paste (not low-sodium or reduced-sodium). Regular tomato and vegetable juice (not low-sodium or reduced-sodium). Frozen vegetables in sauces. Grains Instant hot cereals. Bread stuffing, pancake, and biscuit mixes. Croutons. Seasoned rice or pasta mixes. Noodle soup cups. Boxed or frozen macaroni and cheese. Regular salted crackers. Self-rising flour. Meats and other proteins Meat or fish that is salted, canned, smoked, spiced, or pickled. Precooked or cured meat, such as sausages or meat loaves. Berniece Salines. Ham. Pepperoni. Hot dogs. Corned beef. Chipped beef. Salt pork. Jerky. Pickled herring. Anchovies and sardines. Regular canned tuna. Salted nuts. Dairy Processed cheese and cheese spreads. Hard cheeses. Cheese curds. Blue cheese. Feta cheese. String cheese. Regular cottage cheese. Buttermilk. Canned milk. Fats and oils Salted butter. Regular margarine. Ghee. Bacon fat. Seasonings and condiments Onion salt, garlic salt, seasoned salt, table salt, and sea salt. Canned and packaged gravies. Worcestershire sauce. Tartar sauce. Barbecue sauce. Teriyaki sauce. Soy sauce, including reduced-sodium. Steak sauce. Fish sauce. Oyster sauce. Cocktail sauce. Horseradish that you find on the shelf. Regular ketchup and mustard. Meat flavorings and tenderizers. Bouillon cubes. Hot sauce. Pre-made or packaged marinades. Pre-made or packaged taco seasonings. Relishes. Regular salad dressings. Salsa. Other foods Salted popcorn and pretzels. Corn chips and puffs.  Potato and tortilla chips. Canned or dried soups. Pizza. Frozen entrees and pot pies. The items listed above may not be a complete list of foods and beverages you should avoid. Contact a dietitian for more information. Summary Eating less sodium can help lower your blood  pressure, reduce swelling, and protect your heart, liver, and kidneys. Most people on this plan should limit their sodium intake to 1,500-2,000 mg (milligrams) of sodium each day. Canned, boxed, and frozen foods are high in sodium. Restaurant foods, fast foods, and pizza are also very high in sodium. You also get sodium by adding salt to food. Try to cook at home, eat more fresh fruits and vegetables, and eat less fast food and canned, processed, or prepared foods. This information is not intended to replace advice given to you by your health care provider. Make sure you discuss any questions you have with your health care provider. Document Revised: 09/28/2019 Document Reviewed: 09/23/2019 Elsevier Patient Education  [place] Eating a healthy diet is important for the health of your heart. A heart-healthy eating plan includes: Eating less unhealthy fats. Eating more healthy fats. Eating less salt in your food. Salt is also called sodium. Making other changes in your diet. Talk with your doctor or a diet specialist (dietitian) to create an eating plan that is right for you. What is my plan? Your doctor may recommend an eating plan that includes: Total fat: ______% or less of total calories a day. Saturated fat: ______% or less of total calories a day. Cholesterol: less than _________mg a day. Sodium: less than _________mg a day. What are tips for following this plan? Cooking Avoid frying your food. Try to bake, boil, grill, or broil it instead. You can also reduce fat by: Removing the skin from poultry. Removing all visible fats from meats. Steaming vegetables in water or broth. Meal planning  At meals, divide your plate into four equal parts: Fill one-half of your plate with vegetables and green salads. Fill one-fourth of your plate with whole grains. Fill one-fourth of your plate with lean protein foods. Eat 2-4 cups of vegetables per day. One  cup of vegetables is: 1 cup (91 g) broccoli or cauliflower florets. 2 medium carrots. 1 large bell pepper. 1 large sweet potato. 1 large tomato. 1 medium white potato. 2 cups (150 g) raw leafy greens. Eat 1-2 cups of fruit per day. One cup of fruit is: 1 small apple 1 large banana 1 cup (237 g) mixed fruit, 1 large orange,  cup (82 g) dried fruit, 1 cup (240 mL) 100% fruit juice. Eat more foods that have soluble fiber. These are apples, broccoli, carrots, beans, peas, and barley. Try to get 20-30 g of fiber per day. Eat 4-5 servings of nuts, legumes, and seeds per week: 1 serving of dried beans or legumes equals  cup (90 g) cooked. 1 serving of nuts is  oz (12 almonds, 24 pistachios, or 7 walnut halves). 1 serving of seeds equals  oz (8 g). General information Eat more home-cooked food. Eat less restaurant, buffet, and fast food. Limit or avoid alcohol. Limit foods that are high in starch and sugar. Avoid fried foods. Lose weight if you are overweight. Keep track of how much salt (sodium) you eat. This is important if you have high blood pressure. Ask your doctor to tell you more about this. Try to add vegetarian meals each week. Fats Choose healthy fats. These include olive oil and canola oil,  flaxseeds, walnuts, almonds, and seeds. Eat more omega-3 fats. These include salmon, mackerel, sardines, tuna, flaxseed oil, and ground flaxseeds. Try to eat fish at least 2 times each week. Check food labels. Avoid foods with trans fats or high amounts of saturated fat. Limit saturated fats. These are often found in animal products, such as meats, butter, and cream. These are also found in plant foods, such as palm oil, palm kernel oil, and coconut oil. Avoid foods with partially hydrogenated oils in them. These have trans fats. Examples are stick margarine, some tub margarines, cookies, crackers, and other baked goods. What foods should I eat? Fruits All fresh, canned (in natural  juice), or frozen fruits. Vegetables Fresh or frozen vegetables (raw, steamed, roasted, or grilled). Green salads. Grains Most grains. Choose whole wheat and whole grains most of the time. Rice and pasta, including brown rice and pastas made with whole wheat. Meats and other proteins Lean, well-trimmed beef, veal, pork, and lamb. Chicken and [place] without skin. All fish and shellfish. Wild duck, rabbit, pheasant, and venison. Egg whites or low-cholesterol egg substitutes. Dried beans, peas, lentils, and tofu. Seeds and most nuts. Dairy Low-fat or nonfat cheeses, including ricotta and mozzarella. Skim or 1% milk that is liquid, powdered, or evaporated. Buttermilk that is made with low-fat milk. Nonfat or low-fat yogurt. Fats and oils Non-hydrogenated (trans-free) margarines. Vegetable oils, including soybean, sesame, sunflower, olive, peanut, safflower, corn, canola, and cottonseed. Salad dressings or mayonnaise made with a vegetable oil. Beverages Mineral water. Coffee and tea. Diet carbonated beverages. Sweets and desserts Sherbet, gelatin, and fruit ice. Small amounts of dark chocolate. Limit all sweets and desserts. Seasonings and condiments All seasonings and condiments. The items listed above may not be a complete list of foods and drinks you can eat. Contact a dietitian for more options. What foods should I avoid? Fruits Canned fruit in heavy syrup. Fruit in cream or butter sauce. Fried fruit. Limit coconut. Vegetables Vegetables cooked in cheese, cream, or butter sauce. Fried vegetables. Grains Breads that are made with saturated or trans fats, oils, or whole milk. Croissants. Sweet rolls. Donuts. High-fat crackers, such as cheese crackers. Meats and other proteins Fatty meats, such as hot dogs, ribs, sausage, bacon, rib-eye roast or steak. High-fat deli meats, such as salami and bologna. Caviar. Domestic duck and goose. Organ meats, such as liver. Dairy Cream, sour cream, cream  cheese, and creamed cottage cheese. Whole-milk cheeses. Whole or 2% milk that is liquid, evaporated, or condensed. Whole buttermilk. Cream sauce or high-fat cheese sauce. Yogurt that is made from whole milk. Fats and oils Meat fat, or shortening. Cocoa butter, hydrogenated oils, palm oil, coconut oil, palm kernel oil. Solid fats and shortenings, including bacon fat, salt pork, lard, and butter. Nondairy cream substitutes. Salad dressings with cheese or sour cream. Beverages Regular sodas and juice drinks with added sugar. Sweets and desserts Frosting. Pudding. Cookies. Cakes. Pies. Milk chocolate or white chocolate. Buttered syrups. Full-fat ice cream or ice cream drinks. The items listed above may not be a complete list of foods and drinks to avoid. Contact a dietitian for more information. Summary Heart-healthy meal planning includes eating less unhealthy fats, eating more healthy fats, and making other changes in your diet. Eat a balanced diet. This includes fruits and vegetables, low-fat or nonfat dairy, lean protein, nuts and legumes, whole grains, and heart-healthy oils and fats. This information is not intended to replace advice given to you by your health care provider. Make sure you discuss any  questions you have with your health care provider. Document Revised: 11/27/2021 Document Reviewed: 11/27/2021 Elsevier Patient Education  [place].

## 2023-01-10 NOTE — Addendum Note (Signed)
Addended by: Claude Manges on: 01/10/2023 10:40 AM   Modules accepted: Orders

## 2023-01-10 NOTE — Telephone Encounter (Signed)
Spoke with patient to schedule the Echocardiogram ordered by Nicholes Rough, PA---patient states I can do what I need to do because she is angry with the whole situation over there.  Wants to know why was not scheduled while she was over there.

## 2023-01-14 ENCOUNTER — Other Ambulatory Visit: Payer: Self-pay

## 2023-01-14 DIAGNOSIS — J449 Chronic obstructive pulmonary disease, unspecified: Secondary | ICD-10-CM | POA: Diagnosis not present

## 2023-01-14 DIAGNOSIS — I4891 Unspecified atrial fibrillation: Secondary | ICD-10-CM | POA: Diagnosis not present

## 2023-01-14 DIAGNOSIS — E7800 Pure hypercholesterolemia, unspecified: Secondary | ICD-10-CM | POA: Diagnosis not present

## 2023-01-14 DIAGNOSIS — E039 Hypothyroidism, unspecified: Secondary | ICD-10-CM | POA: Diagnosis not present

## 2023-01-14 DIAGNOSIS — I129 Hypertensive chronic kidney disease with stage 1 through stage 4 chronic kidney disease, or unspecified chronic kidney disease: Secondary | ICD-10-CM | POA: Diagnosis not present

## 2023-01-14 DIAGNOSIS — N184 Chronic kidney disease, stage 4 (severe): Secondary | ICD-10-CM | POA: Diagnosis not present

## 2023-01-14 DIAGNOSIS — E1121 Type 2 diabetes mellitus with diabetic nephropathy: Secondary | ICD-10-CM | POA: Diagnosis not present

## 2023-01-14 DIAGNOSIS — N183 Chronic kidney disease, stage 3 unspecified: Secondary | ICD-10-CM

## 2023-01-14 NOTE — Telephone Encounter (Signed)
Spoke with patient regarding the Echocardiogram ordered by Nicholes Rough, PA---Patient states she is having issues with her medications and is waiting for a call back from the [facility] office---states she is still upset over her visit last week and wants me to call back in a couple of days to talk about scheduling

## 2023-01-15 ENCOUNTER — Encounter (HOSPITAL_COMMUNITY): Payer: Self-pay

## 2023-01-15 ENCOUNTER — Other Ambulatory Visit: Payer: Self-pay | Admitting: *Deleted

## 2023-01-15 ENCOUNTER — Emergency Department (HOSPITAL_COMMUNITY): Payer: Medicare Other

## 2023-01-15 ENCOUNTER — Inpatient Hospital Stay (HOSPITAL_COMMUNITY)
Admission: EM | Admit: 2023-01-15 | Discharge: 2023-01-23 | DRG: 637 | Disposition: A | Discharge status: Skilled Nursing Facility | Payer: Medicare Other | Attending: Internal Medicine | Admitting: Internal Medicine

## 2023-01-15 ENCOUNTER — Other Ambulatory Visit: Payer: Self-pay

## 2023-01-15 DIAGNOSIS — E039 Hypothyroidism, unspecified: Secondary | ICD-10-CM | POA: Diagnosis not present

## 2023-01-15 DIAGNOSIS — I5032 Chronic diastolic (congestive) heart failure: Secondary | ICD-10-CM

## 2023-01-15 DIAGNOSIS — J45909 Unspecified asthma, uncomplicated: Secondary | ICD-10-CM | POA: Diagnosis not present

## 2023-01-15 DIAGNOSIS — R1311 Dysphagia, oral phase: Secondary | ICD-10-CM | POA: Diagnosis not present

## 2023-01-15 DIAGNOSIS — Z7901 Long term (current) use of anticoagulants: Secondary | ICD-10-CM

## 2023-01-15 DIAGNOSIS — M7989 Other specified soft tissue disorders: Secondary | ICD-10-CM

## 2023-01-15 DIAGNOSIS — I429 Cardiomyopathy, unspecified: Secondary | ICD-10-CM | POA: Diagnosis not present

## 2023-01-15 DIAGNOSIS — N1832 Chronic kidney disease, stage 3b: Secondary | ICD-10-CM | POA: Diagnosis present

## 2023-01-15 DIAGNOSIS — R531 Weakness: Secondary | ICD-10-CM | POA: Diagnosis not present

## 2023-01-15 DIAGNOSIS — I442 Atrioventricular block, complete: Secondary | ICD-10-CM | POA: Diagnosis present

## 2023-01-15 DIAGNOSIS — E7800 Pure hypercholesterolemia, unspecified: Secondary | ICD-10-CM | POA: Diagnosis not present

## 2023-01-15 DIAGNOSIS — Z794 Long term (current) use of insulin: Secondary | ICD-10-CM | POA: Diagnosis not present

## 2023-01-15 DIAGNOSIS — E1165 Type 2 diabetes mellitus with hyperglycemia: Secondary | ICD-10-CM | POA: Diagnosis present

## 2023-01-15 DIAGNOSIS — F05 Delirium due to known physiological condition: Secondary | ICD-10-CM | POA: Diagnosis present

## 2023-01-15 DIAGNOSIS — Z950 Presence of cardiac pacemaker: Secondary | ICD-10-CM

## 2023-01-15 DIAGNOSIS — I10 Essential (primary) hypertension: Secondary | ICD-10-CM

## 2023-01-15 DIAGNOSIS — H409 Unspecified glaucoma: Secondary | ICD-10-CM | POA: Diagnosis not present

## 2023-01-15 DIAGNOSIS — F419 Anxiety disorder, unspecified: Secondary | ICD-10-CM | POA: Diagnosis present

## 2023-01-15 DIAGNOSIS — Z7401 Bed confinement status: Secondary | ICD-10-CM | POA: Diagnosis not present

## 2023-01-15 DIAGNOSIS — I878 Other specified disorders of veins: Secondary | ICD-10-CM | POA: Diagnosis present

## 2023-01-15 DIAGNOSIS — J4489 Other specified chronic obstructive pulmonary disease: Secondary | ICD-10-CM | POA: Diagnosis not present

## 2023-01-15 DIAGNOSIS — Z7984 Long term (current) use of oral hypoglycemic drugs: Secondary | ICD-10-CM | POA: Diagnosis not present

## 2023-01-15 DIAGNOSIS — I130 Hypertensive heart and chronic kidney disease with heart failure and stage 1 through stage 4 chronic kidney disease, or unspecified chronic kidney disease: Secondary | ICD-10-CM | POA: Diagnosis present

## 2023-01-15 DIAGNOSIS — J449 Chronic obstructive pulmonary disease, unspecified: Secondary | ICD-10-CM | POA: Diagnosis not present

## 2023-01-15 DIAGNOSIS — Z743 Need for continuous supervision: Secondary | ICD-10-CM | POA: Diagnosis not present

## 2023-01-15 DIAGNOSIS — K5900 Constipation, unspecified: Secondary | ICD-10-CM | POA: Diagnosis present

## 2023-01-15 DIAGNOSIS — R410 Disorientation, unspecified: Secondary | ICD-10-CM | POA: Diagnosis not present

## 2023-01-15 DIAGNOSIS — M869 Osteomyelitis, unspecified: Secondary | ICD-10-CM | POA: Diagnosis not present

## 2023-01-15 DIAGNOSIS — E1122 Type 2 diabetes mellitus with diabetic chronic kidney disease: Secondary | ICD-10-CM | POA: Diagnosis not present

## 2023-01-15 DIAGNOSIS — M86172 Other acute osteomyelitis, left ankle and foot: Secondary | ICD-10-CM | POA: Diagnosis not present

## 2023-01-15 DIAGNOSIS — Z91013 Allergy to seafood: Secondary | ICD-10-CM

## 2023-01-15 DIAGNOSIS — R234 Changes in skin texture: Secondary | ICD-10-CM | POA: Diagnosis present

## 2023-01-15 DIAGNOSIS — I4891 Unspecified atrial fibrillation: Secondary | ICD-10-CM | POA: Diagnosis not present

## 2023-01-15 DIAGNOSIS — I5033 Acute on chronic diastolic (congestive) heart failure: Secondary | ICD-10-CM | POA: Diagnosis not present

## 2023-01-15 DIAGNOSIS — I480 Paroxysmal atrial fibrillation: Secondary | ICD-10-CM | POA: Diagnosis not present

## 2023-01-15 DIAGNOSIS — Z90710 Acquired absence of both cervix and uterus: Secondary | ICD-10-CM

## 2023-01-15 DIAGNOSIS — M6281 Muscle weakness (generalized): Secondary | ICD-10-CM | POA: Diagnosis not present

## 2023-01-15 DIAGNOSIS — Z833 Family history of diabetes mellitus: Secondary | ICD-10-CM

## 2023-01-15 DIAGNOSIS — E1169 Type 2 diabetes mellitus with other specified complication: Principal | ICD-10-CM | POA: Diagnosis present

## 2023-01-15 DIAGNOSIS — M1990 Unspecified osteoarthritis, unspecified site: Secondary | ICD-10-CM | POA: Diagnosis present

## 2023-01-15 DIAGNOSIS — Z96653 Presence of artificial knee joint, bilateral: Secondary | ICD-10-CM | POA: Diagnosis present

## 2023-01-15 DIAGNOSIS — E119 Type 2 diabetes mellitus without complications: Secondary | ICD-10-CM | POA: Diagnosis not present

## 2023-01-15 DIAGNOSIS — R600 Localized edema: Secondary | ICD-10-CM | POA: Diagnosis not present

## 2023-01-15 DIAGNOSIS — Z79899 Other long term (current) drug therapy: Secondary | ICD-10-CM | POA: Diagnosis not present

## 2023-01-15 DIAGNOSIS — R0902 Hypoxemia: Secondary | ICD-10-CM | POA: Diagnosis not present

## 2023-01-15 DIAGNOSIS — J9610 Chronic respiratory failure, unspecified whether with hypoxia or hypercapnia: Secondary | ICD-10-CM | POA: Diagnosis not present

## 2023-01-15 DIAGNOSIS — S81802A Unspecified open wound, left lower leg, initial encounter: Secondary | ICD-10-CM | POA: Diagnosis not present

## 2023-01-15 DIAGNOSIS — S91102A Unspecified open wound of left great toe without damage to nail, initial encounter: Secondary | ICD-10-CM | POA: Diagnosis not present

## 2023-01-15 DIAGNOSIS — Z79890 Hormone replacement therapy (postmenopausal): Secondary | ICD-10-CM

## 2023-01-15 DIAGNOSIS — R2681 Unsteadiness on feet: Secondary | ICD-10-CM | POA: Diagnosis not present

## 2023-01-15 LAB — CBC WITH DIFFERENTIAL/PLATELET
Abs Immature Granulocytes: 0.07 10*3/uL (ref 0.00–0.07)
Basophils Absolute: 0.1 10*3/uL (ref 0.0–0.1)
Basophils Relative: 1 %
Eosinophils Absolute: 0.1 10*3/uL (ref 0.0–0.5)
Eosinophils Relative: 1 %
HCT: 36.8 % (ref 36.0–46.0)
Hemoglobin: 11.3 g/dL — ABNORMAL LOW (ref 12.0–15.0)
Immature Granulocytes: 1 %
Lymphocytes Relative: 13 %
Lymphs Abs: 1 10*3/uL (ref 0.7–4.0)
MCH: 29.1 pg (ref 26.0–34.0)
MCHC: 30.7 g/dL (ref 30.0–36.0)
MCV: 94.8 fL (ref 80.0–100.0)
Monocytes Absolute: 0.7 10*3/uL (ref 0.1–1.0)
Monocytes Relative: 9 %
Neutro Abs: 5.9 10*3/uL (ref 1.7–7.7)
Neutrophils Relative %: 75 %
Platelets: 192 10*3/uL (ref 150–400)
RBC: 3.88 MIL/uL (ref 3.87–5.11)
RDW: 13.5 % (ref 11.5–15.5)
WBC: 7.8 10*3/uL (ref 4.0–10.5)
nRBC: 0 % (ref 0.0–0.2)

## 2023-01-15 LAB — URINALYSIS, ROUTINE W REFLEX MICROSCOPIC
Bilirubin Urine: NEGATIVE
Glucose, UA: NEGATIVE mg/dL
Ketones, ur: NEGATIVE mg/dL
Nitrite: NEGATIVE
Protein, ur: NEGATIVE mg/dL
Specific Gravity, Urine: 1.01 (ref 1.005–1.030)
pH: 6.5 (ref 5.0–8.0)

## 2023-01-15 LAB — URINALYSIS, MICROSCOPIC (REFLEX): RBC / HPF: NONE SEEN RBC/hpf (ref 0–5)

## 2023-01-15 LAB — COMPREHENSIVE METABOLIC PANEL
ALT: 10 U/L (ref 0–44)
AST: 18 U/L (ref 15–41)
Albumin: 3.9 g/dL (ref 3.5–5.0)
Alkaline Phosphatase: 73 U/L (ref 38–126)
Anion gap: 11 (ref 5–15)
BUN: 29 mg/dL — ABNORMAL HIGH (ref 8–23)
CO2: 24 mmol/L (ref 22–32)
Calcium: 8.7 mg/dL — ABNORMAL LOW (ref 8.9–10.3)
Chloride: 102 mmol/L (ref 98–111)
Creatinine, Ser: 2.2 mg/dL — ABNORMAL HIGH (ref 0.44–1.00)
GFR, Estimated: 22 mL/min — ABNORMAL LOW (ref >60)
Glucose, Bld: 154 mg/dL — ABNORMAL HIGH (ref 70–99)
Potassium: 3.7 mmol/L (ref 3.5–5.1)
Sodium: 137 mmol/L (ref 135–145)
Total Bilirubin: 0.7 mg/dL (ref 0.3–1.2)
Total Protein: 7.3 g/dL (ref 6.5–8.1)

## 2023-01-15 LAB — GLUCOSE, CAPILLARY: Glucose-Capillary: 181 mg/dL — ABNORMAL HIGH (ref 70–99)

## 2023-01-15 LAB — LACTIC ACID, PLASMA: Lactic Acid, Venous: 1.2 mmol/L (ref 0.5–1.9)

## 2023-01-15 MED ORDER — ONDANSETRON HCL 4 MG/2ML IJ SOLN: 4.0000 mg | Freq: Four times a day (QID) | INTRAMUSCULAR | Status: DC | PRN

## 2023-01-15 MED ORDER — ALPRAZOLAM 0.25 MG PO TABS
0.2500 mg | ORAL_TABLET | Freq: Every day | ORAL | Status: DC | PRN
  Administered 2023-01-16 – 2023-01-21 (×3): 0.25 mg via ORAL
  Filled 2023-01-15 (×4): qty 1

## 2023-01-15 MED ORDER — ACETAMINOPHEN 325 MG PO TABS: 650.0000 mg | ORAL_TABLET | Freq: Four times a day (QID) | ORAL | Status: DC | PRN

## 2023-01-15 MED ORDER — TORSEMIDE 20 MG PO TABS
20.0000 mg | ORAL_TABLET | Freq: Two times a day (BID) | ORAL | Status: DC
  Administered 2023-01-15 – 2023-01-18 (×7): 20 mg via ORAL
  Filled 2023-01-15 (×8): qty 1

## 2023-01-15 MED ORDER — ONDANSETRON HCL 4 MG PO TABS: 4.0000 mg | ORAL_TABLET | Freq: Four times a day (QID) | ORAL | Status: DC | PRN

## 2023-01-15 MED ORDER — LEVOTHYROXINE SODIUM 88 MCG PO TABS
88.0000 ug | ORAL_TABLET | Freq: Every morning | ORAL | Status: DC
  Administered 2023-01-16 – 2023-01-23 (×8): 88 ug via ORAL
  Filled 2023-01-15 (×8): qty 1

## 2023-01-15 MED ORDER — AMIODARONE HCL 200 MG PO TABS
200.0000 mg | ORAL_TABLET | ORAL | Status: DC
  Administered 2023-01-16 – 2023-01-23 (×7): 200 mg via ORAL
  Filled 2023-01-15 (×7): qty 1

## 2023-01-15 MED ORDER — ATORVASTATIN CALCIUM 10 MG PO TABS
10.0000 mg | ORAL_TABLET | Freq: Every day | ORAL | Status: DC
  Administered 2023-01-16 – 2023-01-23 (×8): 10 mg via ORAL
  Filled 2023-01-15 (×8): qty 1

## 2023-01-15 MED ORDER — ALBUTEROL SULFATE (2.5 MG/3ML) 0.083% IN NEBU: 2.5000 mg | INHALATION_SOLUTION | RESPIRATORY_TRACT | Status: DC | PRN

## 2023-01-15 MED ORDER — METOPROLOL SUCCINATE ER 50 MG PO TB24
25.0000 mg | ORAL_TABLET | Freq: Every day | ORAL | Status: DC
  Administered 2023-01-15 – 2023-01-23 (×9): 25 mg via ORAL
  Filled 2023-01-15 (×9): qty 1

## 2023-01-15 MED ORDER — INSULIN ASPART 100 UNIT/ML IJ SOLN
0.0000 [IU] | Freq: Three times a day (TID) | INTRAMUSCULAR | Status: DC
  Administered 2023-01-16: 1 [IU] via SUBCUTANEOUS
  Administered 2023-01-16: 2 [IU] via SUBCUTANEOUS
  Administered 2023-01-16: 1 [IU] via SUBCUTANEOUS
  Administered 2023-01-17 (×3): 2 [IU] via SUBCUTANEOUS
  Administered 2023-01-18: 1 [IU] via SUBCUTANEOUS
  Administered 2023-01-18 – 2023-01-19 (×4): 2 [IU] via SUBCUTANEOUS
  Administered 2023-01-19: 3 [IU] via SUBCUTANEOUS
  Administered 2023-01-20: 2 [IU] via SUBCUTANEOUS
  Administered 2023-01-20: 3 [IU] via SUBCUTANEOUS
  Administered 2023-01-20: 1 [IU] via SUBCUTANEOUS
  Administered 2023-01-21 (×2): 2 [IU] via SUBCUTANEOUS
  Administered 2023-01-21: 1 [IU] via SUBCUTANEOUS
  Administered 2023-01-22: 7 [IU] via SUBCUTANEOUS
  Administered 2023-01-22: 2 [IU] via SUBCUTANEOUS
  Administered 2023-01-23: 5 [IU] via SUBCUTANEOUS
  Administered 2023-01-23: 1 [IU] via SUBCUTANEOUS
  Administered 2023-01-23: 2 [IU] via SUBCUTANEOUS
  Filled 2023-01-15: qty 0.09

## 2023-01-15 MED ORDER — SODIUM CHLORIDE 0.9 % IV SOLN
2.0000 g | INTRAVENOUS | Status: DC
  Administered 2023-01-15 – 2023-01-17 (×3): 2 g via INTRAVENOUS
  Filled 2023-01-15 (×3): qty 12.5

## 2023-01-15 MED ORDER — VANCOMYCIN HCL 750 MG/150ML IV SOLN
750.0000 mg | INTRAVENOUS | Status: DC
  Administered 2023-01-17: 750 mg via INTRAVENOUS
  Filled 2023-01-15: qty 150

## 2023-01-15 MED ORDER — ACETAMINOPHEN 650 MG RE SUPP: 650.0000 mg | Freq: Four times a day (QID) | RECTAL | Status: DC | PRN

## 2023-01-15 MED ORDER — VANCOMYCIN HCL IN DEXTROSE 1-5 GM/200ML-% IV SOLN
1000.0000 mg | Freq: Once | INTRAVENOUS | Status: AC
  Administered 2023-01-15: 1000 mg via INTRAVENOUS
  Filled 2023-01-15: qty 200

## 2023-01-15 MED ORDER — AMIODARONE HCL 200 MG PO TABS: 200.0000 mg | ORAL_TABLET | Freq: Every day | ORAL | Status: DC

## 2023-01-15 MED ORDER — PIPERACILLIN-TAZOBACTAM 3.375 G IVPB 30 MIN
3.3750 g | Freq: Once | INTRAVENOUS | Status: AC
  Administered 2023-01-15: 3.375 g via INTRAVENOUS
  Filled 2023-01-15: qty 50

## 2023-01-15 NOTE — Progress Notes (Signed)
A consult was received from an ED provider for vancomycin + piperacillin/tazobactam per pharmacy dosing.  The patient's profile has been reviewed for ht/wt/allergies/indication/available labs.    A one time order has been placed for vancomycin 1000 mg + piperacillin/tazobactam 3.375 g.    Further antibiotics/pharmacy consults should be ordered by admitting physician if indicated.                       Thank you, Lenis Noon, PharmD 01/15/2023  2:07 PM

## 2023-01-15 NOTE — H&P (Addendum)
History and Physical    Lydia Myers [id number] DOB: [birth year] DOA: 01/15/2023  PCP: Collene Leyden, MD  Patient coming from: home  I have personally briefly reviewed patient's old medical records in [place]  Chief Complaint: leg wound left leg with foul odor and drainage  HPI: Lydia Myers is a 82 y.o. female with medical history significant of Arthritis, COPD/Asthma, DMII, PAF on xarelto, amiodarone, CHFpef, CHB status post PPM, CKDIIIb-IV Hypertension ,hypothyroidism, chronic venostasis, glaucoma, patient presents to ED at urging of family due to noted leg wound on left lower extremity. Patient has chronic venostasis changes / thickening of skin on lower extremities.  Patient states she has no pain and did not smell odor. She lives alone but has home health aide.  Per chart  care giver state wound has been progressive x 3 weeks although the initial onset of wound is unclear. Patient states she feels her normal self. She notes no fever /chills/ n/v/d abdominal pain.    ED Course:  Afeb, bp 162/65, hr 71, rr 16, sat 96% on ra  Wbc: 7.8, hgb 11.3 ( 12.1)  plt 192 Lactic 1.2  NA 137, K 3.7, cl 102, cr 2.2 ( at baseline Xray 1. Skin irregularity posterior ankle region may be related to the patient's described wound. There is some cortical irregularity of the calcaneal tuberosity underlying this region. Osteomyelitis a concern. MRI with and without contrast could be used to further evaluate as clinically warranted.  Cxr: IMPRESSION: Chronic cardiomegaly. Dual lead pacemaker. Possible pulmonary venous hypertension but no frank edema.   Tx:  Zosyn,  vanc Unable to get MRI as patient has pacer Review of Systems: As per HPI otherwise 10 point review of systems negative.   Past Medical History:  Diagnosis Date   Arthritis    Asthma    Cardiomyopathy ([place])    COPD (chronic obstructive pulmonary disease) (HCC)    Diabetes mellitus    Glaucoma    High cholesterol     Hypertension    Systolic dysfunction, left ventricle     Past Surgical History:  Procedure Laterality Date   A-FLUTTER ABLATION N/A 12/09/2019   Procedure: A-FLUTTER ABLATION;  Surgeon: Evans Lance, MD;  Location: [facility];  Service: Cardiovascular;  Laterality: N/A;   ABDOMINAL HYSTERECTOMY     AV NODE ABLATION N/A 12/14/2019   Procedure: AV NODE ABLATION;  Surgeon: Evans Lance, MD;  Location: [facility];  Service: Cardiovascular;  Laterality: N/A;   HIP SURGERY     JOINT REPLACEMENT     left lumpectomy     PACEMAKER IMPLANT N/A 12/14/2019   Procedure: PACEMAKER IMPLANT;  Surgeon: Evans Lance, MD;  Location: [facility];  Service: Cardiovascular;  Laterality: N/A;   REPLACEMENT TOTAL KNEE BILATERAL     2009-Right, 2001-Left   Right bog toe fusion     Right trigger finger surgery       reports that she has never smoked. She has never used smokeless tobacco. She reports that she does not drink alcohol and does not use drugs.  Allergies  Allergen Reactions   Shrimp [Shellfish Allergy] Nausea And Vomiting   Shellfish-Derived Products     Other reaction(s): Unknown    Family History  Problem Relation Age of Onset   Diabetes Brother     Prior to Admission medications   Medication Sig Start Date End Date Taking? Authorizing Provider  ACCU-CHEK GUIDE test strip USE TO CHECK  FINGER STICK BLOOD SUGAR TWICE DAILY 01/03/21   [provider]  Accu-Chek Softclix Lancets lancets 2 (two) times daily. 01/03/21   [provider]  acetaminophen (TYLENOL) 325 MG tablet Take 1-2 tablets (325-650 mg total) by mouth every 4 (four) hours as needed for mild pain. 12/15/19   Shirley Friar, PA-C  ALPRAZolam Duanne Moron) 0.25 MG tablet Take 0.25 mg by mouth daily as needed. 06/22/20   [provider]  amiodarone (PACERONE) 200 MG tablet Take one tablet by mouth Monday through Saturday. DO NOT TAKE on SUNDAY. 12/07/22   Evans Lance, MD   atorvastatin (LIPITOR) 10 MG tablet TAKE 1 TABLET BY MOUTH EVERY DAY AT [facility] 06/01/19   Sherran Needs, NP  AZOPT 1 % ophthalmic suspension Place 1 drop into both eyes 2 (two) times daily. 05/01/17   [provider]  BD PEN NEEDLE NANO 2ND GEN 32G X 4 MM MISC USE ONCE A DAY WITH BASAGLAR KWIK PEN 04/08/21   [provider]  Blood Glucose Monitoring Suppl (ACCU-CHEK GUIDE) w/Device KIT USE TO CHECK BLOOD SUGAR 2 TIMES A DAY 01/03/21   [provider]  doxycycline (DORYX) 100 MG EC tablet Take 1 tablet (100 mg total) by mouth 2 (two) times daily. 01/10/23   Elgie Collard, PA-C  Lactobacillus-Inulin (CULTURELLE DIGESTIVE DAILY) CAPS Take one capsule twice daily for 21 days. 09/11/21   Marzetta Board, DPM  LEVEMIR FLEXTOUCH 100 UNIT/ML FlexPen Inject 100 mLs into the skin daily. 03/29/21   [provider]  levothyroxine (SYNTHROID) 88 MCG tablet Take 88 mcg by mouth every morning. 12/31/20   [provider]  losartan (COZAAR) 25 MG tablet Take 1 tablet by mouth daily.    [provider]  metoprolol succinate (TOPROL-XL) 25 MG 24 hr tablet Take 1 tablet by mouth daily.    [provider]  Miconazole Nitrate 2 % AERP Spray on plantar aspect of left foot once daily 09/06/21   Marzetta Board, DPM  olmesartan (BENICAR) 40 MG tablet Take 40 mg by mouth daily. 11/27/19   [provider]  Rivaroxaban (XARELTO) 15 MG TABS tablet TAKE 1 TABLET BY MOUTH EVERY DAY WITH DINNER 12/07/22   Evans Lance, MD  sitaGLIPtin (JANUVIA) 100 MG tablet 1 tablet 08/28/19   [provider]  torsemide (DEMADEX) 20 MG tablet Take 1 tablet (20 mg total) by mouth 2 (two) times daily. 12/07/22   Evans Lance, MD  TRAVATAN Z 0.004 % SOLN ophthalmic solution Place 1 drop into both eyes every evening. 05/01/17   [provider]    Physical Exam: Vitals:   01/15/23 1009 01/15/23 1500 01/15/23 1615  BP: (!) 162/65  (!) 169/150  Pulse: 71  68   Resp: 16    Temp: 98 F (36.7 C) 98 F (36.7 C)   TempSrc: Oral    SpO2: 96%  100%    Constitutional: NAD, calm, comfortable Vitals:   01/15/23 1009 01/15/23 1500 01/15/23 1615  BP: (!) 162/65  (!) 169/150  Pulse: 71  68  Resp: 16    Temp: 98 F (36.7 C) 98 F (36.7 C)   TempSrc: Oral    SpO2: 96%  100%   Eyes: PERRL, lids and conjunctivae normal ENMT: Mucous membranes are moist. Posterior pharynx clear of any exudate or lesions.Normal dentition.  Neck: normal, supple, no masses, no thyromegaly Respiratory: clear to auscultation bilaterally, no wheezing, no crackles. Normal respiratory effort. No accessory muscle use.  Cardiovascular: Regular rate and rhythm, no murmurs / rubs / gallops. + extremity edema. 2+ pedal pulses.  Abdomen: no tenderness, no masses palpated. No hepatosplenomegaly. Bowel sounds positive.  Musculoskeletal: no clubbing / cyanosis. No joint deformity upper and lower extremities. Good ROM, no contractures. Normal muscle tone.  Skin:  chronic venostasis  b/l lower extremity noted severe lichenification as well , left posterior leg/ ankle with note open  wound, note foul drainage   Neurologic: CN 2-12 grossly intact. Sensation intact, . Strength 4/5 in lower ext 4+ upper ext.  Psychiatric: Normal judgment and insight. Alert and oriented. Normal mood.    Labs on Admission: I have personally reviewed following labs and imaging studies  CBC: Recent Labs  Lab 01/15/23 1055  WBC 7.8  NEUTROABS 5.9  HGB 11.3*  HCT 36.8  MCV 94.8  PLT [id number]   Basic Metabolic Panel: Recent Labs  Lab 01/15/23 1055  NA 137  K 3.7  CL 102  CO2 24  GLUCOSE 154*  BUN 29*  CREATININE 2.20*  CALCIUM 8.7*   GFR: CrCl cannot be calculated (Unknown ideal weight.). Liver Function Tests: Recent Labs  Lab 01/15/23 1055  AST 18  ALT 10  ALKPHOS 73  BILITOT 0.7  PROT 7.3  ALBUMIN 3.9   No results for input(s): "LIPASE", "AMYLASE" in the last 168 hours. No results  for input(s): "AMMONIA" in the last 168 hours. Coagulation Profile: No results for input(s): "INR", "PROTIME" in the last 168 hours. Cardiac Enzymes: No results for input(s): "CKTOTAL", "CKMB", "CKMBINDEX", "TROPONINI" in the last 168 hours. BNP (last 3 results) No results for input(s): "PROBNP" in the last 8760 hours. HbA1C: No results for input(s): "HGBA1C" in the last 72 hours. CBG: No results for input(s): "GLUCAP" in the last 168 hours. Lipid Profile: No results for input(s): "CHOL", "HDL", "LDLCALC", "TRIG", "CHOLHDL", "LDLDIRECT" in the last 72 hours. Thyroid Function Tests: No results for input(s): "TSH", "T4TOTAL", "FREET4", "T3FREE", "THYROIDAB" in the last 72 hours. Anemia Panel: No results for input(s): "VITAMINB12", "FOLATE", "FERRITIN", "TIBC", "IRON", "RETICCTPCT" in the last 72 hours. Urine analysis:    Component Value Date/Time   COLORURINE AMBER (A) 07/04/2017 0629   APPEARANCEUR HAZY (A) 07/04/2017 0629   LABSPEC 1.016 07/04/2017 0629   PHURINE 5.0 07/04/2017 0629   GLUCOSEU NEGATIVE 07/04/2017 0629   HGBUR LARGE (A) 07/04/2017 0629   BILIRUBINUR NEGATIVE 07/04/2017 0629   KETONESUR NEGATIVE 07/04/2017 0629   PROTEINUR 100 (A) 07/04/2017 0629   UROBILINOGEN 1.0 09/30/2010 1812   NITRITE NEGATIVE 07/04/2017 0629   LEUKOCYTESUR NEGATIVE 07/04/2017 0629    Radiological Exams on Admission: DG Chest 2 View  Result Date: 01/15/2023 CLINICAL DATA:  Lower extremity edema EXAM: CHEST - 2 VIEW COMPARISON:  08/02/2020 FINDINGS: Chronic cardiomegaly. Dual lead pacemaker. Possible pulmonary venous hypertension but no frank edema. No visible effusion. Mild chronic scarring at the left lung base. IMPRESSION: Chronic cardiomegaly. Dual lead pacemaker. Possible pulmonary venous hypertension but no frank edema. Electronically Signed   By: Nelson Chimes M.D.   On: 01/15/2023 14:08   DG Foot Complete Left  Result Date: 01/15/2023 CLINICAL DATA:  Wound cellulitis back of left  leg. EXAM: LEFT FOOT - COMPLETE 3+ VIEW COMPARISON:  Skip of cellulitis. FINDINGS: Bones are diffusely demineralized which limits assessment. Within this limitation, there is no evidence for an acute fracture or dislocation. No worrisome lytic or sclerotic osseous abnormality. Skin irregularity posterior ankle region may be related to the patient's described wound. There is some cortical  irregularity of the calcaneal tuberosity underlying this region. IMPRESSION: 1. Skin irregularity posterior ankle region may be related to the patient's described wound. There is some cortical irregularity of the calcaneal tuberosity underlying this region. Osteomyelitis a concern. MRI with and without contrast could be used to further evaluate as clinically warranted. Electronically Signed   By: Misty Stanley M.D.   On: 01/15/2023 11:45    EKG: Independently reviewed. N/a  Assessment/Plan Osteomyelitis of Left calcaneal tuberosity -admit to tele -continue with broad spectrum abx  -f/u on cultures  -crp ordered  -wound care to see  - [facility]:( [place] MD)  -patient not able to get MRI   COPD/Asthma -no acute exacerbation  --prn nebs  -no controller medications noted on med list   PAF -continue on amiodarone -hold xarelto    DMII  -controlled  - place on iss/fs  -resume long acting glargine   CHFpef -no current exacerbation  -continue metoprolol /cozaar,torsemide -strict I/o/ daily weights    Hypothyroidism -resume synthroid  CHB s/p pacer   CKDIIIb-IV -will need outpatient f/u with nephrology  -at baseline  - per cardiology swelling in lower extremity related to renal dysfunction not cardiac    Hypertension, uncontrolled -not currently at [place] but acceptable for now  -resume home regimen once med rec complete ( cozaar, metoprolol) - titrate as needed     Glaucoma -continue travatan  Anxiety  -resume xanax once med rec completed    DVT prophylaxis:  on xarelto on  hold pending need or intervention   Code Status: full/ as discussed per patient wishes in event of cardiac arrest  Family Communication: none at bedside Disposition Plan:  patient  expected to be admitted greater than 2 midnights  Consults called: podiatry  Admission status: med tele   Clance Boll MD [facility]   If 7PM-7AM, please contact night-coverage [email or web address] Password [facility]  01/15/2023, 4:58 PM

## 2023-01-15 NOTE — ED Notes (Signed)
ED TO INPATIENT HANDOFF REPORT  Name/Age/Gender Lydia Myers 82 y.o. female  Code Status Code Status History     Date Active Date Inactive Code Status Order ID Comments User Context   12/09/2019 0134 12/15/2019 1805 DNR [id number]  Lydia Myers Inpatient   07/01/2017 2225 07/05/2017 2051 DNR [id number]  Lydia Millet, DO Inpatient    Questions for Most Recent Historical Code Status (Order [id number])     Question Answer   In the event of cardiac or respiratory ARREST Do not call a "code blue"   In the event of cardiac or respiratory ARREST Do not perform Intubation, CPR, defibrillation or ACLS   In the event of cardiac or respiratory ARREST Use medication by any route, position, wound care, and other measures to relive pain and suffering. May use oxygen, suction and manual treatment of airway obstruction as needed for comfort.            Home/SNF/Other Home  Chief Complaint Osteomyelitis ([place]) [M86.9]  Level of Care/Admitting Diagnosis ED Disposition     ED Disposition  Admit   Condition  --   Comment  Hospital Area: [place] [[id number]]  Level of Care: Telemetry [5]  Admit to tele based on following criteria: Other see comments  Comments: ostemyeltis  May admit patient to [facility] or [facility] if equivalent level of care is available:: Yes  Covid Evaluation: Asymptomatic - no recent exposure (last 10 days) testing not required  Diagnosis: Osteomyelitis [facility]) [id number]  Admitting Physician: Clance Boll [id number]  Attending Physician: Clance Boll [id number]  Certification:: I certify this patient will need inpatient services for at least 2 midnights  Estimated Length of Stay: 3          Medical History Past Medical History:  Diagnosis Date   Arthritis    Asthma    Cardiomyopathy ([place])    COPD (chronic obstructive pulmonary disease) ([place])    Diabetes mellitus    Glaucoma    High cholesterol     Hypertension    Systolic dysfunction, left ventricle     Allergies Allergies  Allergen Reactions   Shrimp [Shellfish Allergy] Nausea And Vomiting   Shellfish-Derived Products     Other reaction(s): Unknown    IV Location/Drains/Wounds Patient Lines/Drains/Airways Status     Active Line/Drains/Airways     Name Placement date Placement time Site Days   Peripheral IV 01/15/23 20 G Posterior;Right Hand 01/15/23  1351  Hand  less than 1            Labs/Imaging Results for orders placed or performed during the hospital encounter of 01/15/23 (from the past 48 hour(s))  Comprehensive metabolic panel     Status: Abnormal   Collection Time: 01/15/23 10:55 AM  Result Value Ref Range   Sodium 137 135 - 145 mmol/L   Potassium 3.7 3.5 - 5.1 mmol/L   Chloride 102 98 - 111 mmol/L   CO2 24 22 - 32 mmol/L   Glucose, Bld 154 (H) 70 - 99 mg/dL    Comment: Glucose reference range applies only to samples taken after fasting for at least 8 hours.   BUN 29 (H) 8 - 23 mg/dL   Creatinine, Ser 2.20 (H) 0.44 - 1.00 mg/dL   Calcium 8.7 (L) 8.9 - 10.3 mg/dL   Total Protein 7.3 6.5 - 8.1 g/dL   Albumin 3.9 3.5 - 5.0 g/dL   AST 18 15 - 41 U/L   ALT 10  0 - 44 U/L   Alkaline Phosphatase 73 38 - 126 U/L   Total Bilirubin 0.7 0.3 - 1.2 mg/dL   GFR, Estimated 22 (L) >60 mL/min    Comment: (NOTE) Calculated using the CKD-EPI Creatinine Equation (2021)    Anion gap 11 5 - 15    Comment: Performed at [facility], [place] [address] [place], [place] [address]  CBC with Differential     Status: Abnormal   Collection Time: 01/15/23 10:55 AM  Result Value Ref Range   WBC 7.8 4.0 - 10.5 K/uL   RBC 3.88 3.87 - 5.11 MIL/uL   Hemoglobin 11.3 (L) 12.0 - 15.0 g/dL   HCT 36.8 36.0 - 46.0 %   MCV 94.8 80.0 - 100.0 fL   MCH 29.1 26.0 - 34.0 pg   MCHC 30.7 30.0 - 36.0 g/dL   RDW 13.5 11.5 - 15.5 %   Platelets 192 150 - 400 K/uL   nRBC 0.0 0.0 - 0.2 %   Neutrophils Relative % 75 %    Neutro Abs 5.9 1.7 - 7.7 K/uL   Lymphocytes Relative 13 %   Lymphs Abs 1.0 0.7 - 4.0 K/uL   Monocytes Relative 9 %   Monocytes Absolute 0.7 0.1 - 1.0 K/uL   Eosinophils Relative 1 %   Eosinophils Absolute 0.1 0.0 - 0.5 K/uL   Basophils Relative 1 %   Basophils Absolute 0.1 0.0 - 0.1 K/uL   Immature Granulocytes 1 %   Abs Immature Granulocytes 0.07 0.00 - 0.07 K/uL    Comment: Performed at [facility], [place] [address] [place], [place] [address]  Lactic acid     Status: None   Collection Time: 01/15/23 10:55 AM  Result Value Ref Range   Lactic Acid, Venous 1.2 0.5 - 1.9 mmol/L    Comment: Performed at [facility], [place] [address] [place], [place] [address]  Blood Cultures x 2 sites     Status: None (Preliminary result)   Collection Time: 01/15/23  1:38 PM   Specimen: BLOOD LEFT FOREARM  Result Value Ref Range   Specimen Description      BLOOD LEFT FOREARM Performed at [facility], [place] [address] [place], [place] [address]    Special Requests      BOTTLES DRAWN AEROBIC AND ANAEROBIC Blood Culture results may not be optimal due to an inadequate volume of blood received in culture bottles Performed at [facility], [place] [address], [place],  [address]    Culture PENDING    Report Status PENDING    DG Chest 2 View  Result Date: 01/15/2023 CLINICAL DATA:  Lower extremity edema EXAM: CHEST - 2 VIEW COMPARISON:  08/02/2020 FINDINGS: Chronic cardiomegaly. Dual lead pacemaker. Possible pulmonary venous hypertension but no frank edema. No visible effusion. Mild chronic scarring at the left lung base. IMPRESSION: Chronic cardiomegaly. Dual lead pacemaker. Possible pulmonary venous hypertension but no frank edema. Electronically Signed   By: Nelson Chimes M.D.   On: 01/15/2023 14:08   DG Foot Complete Left  Result Date: 01/15/2023 CLINICAL DATA:  Wound cellulitis back of left leg. EXAM: LEFT FOOT - COMPLETE 3+ VIEW  COMPARISON:  Skip of cellulitis. FINDINGS: Bones are diffusely demineralized which limits assessment. Within this limitation, there is no evidence for an acute fracture or dislocation. No worrisome lytic or sclerotic osseous abnormality. Skin irregularity posterior ankle region may be related to the patient's described wound. There is some cortical irregularity  of the calcaneal tuberosity underlying this region. IMPRESSION: 1. Skin irregularity posterior ankle region may be related to the patient's described wound. There is some cortical irregularity of the calcaneal tuberosity underlying this region. Osteomyelitis a concern. MRI with and without contrast could be used to further evaluate as clinically warranted. Electronically Signed   By: Misty Stanley M.D.   On: 01/15/2023 11:45    Pending Labs Unresulted Labs (From admission, onward)     Start     Ordered   01/15/23 1252  Blood Cultures x 2 sites  BLOOD CULTURE X 2,   STAT      01/15/23 1252            Vitals/Pain Today's Vitals   01/15/23 1500 01/15/23 1615 01/15/23 1645 01/15/23 1730  BP:  (!) 169/150 (!) 217/194 (!) 147/73  Pulse:  68 65 73  Resp:   16   Temp: 98 F (36.7 C)     TempSrc:      SpO2:  100% 98% 100%  PainSc:        Isolation Precautions No active isolations  Medications Medications  vancomycin (VANCOCIN) IVPB 1000 mg/200 mL premix (0 mg Intravenous Stopped 01/15/23 1607)  piperacillin-tazobactam (ZOSYN) IVPB 3.375 g (0 g Intravenous Stopped 01/15/23 1507)    Mobility walks

## 2023-01-15 NOTE — ED Provider Triage Note (Signed)
Emergency Medicine Provider Triage Evaluation Note  Lydia Myers , a 82 y.o. female  was evaluated in triage.  Pt complains of wounds to the left heel.  Patient states that it has been there for years but has been worsening over the last 3 weeks and is now painful with foul-smelling discharge.  She does have a history of diabetes but is unsure what her blood glucose has been at home.  She states she is compliant with her medications and has been on antibiotics recently.  She does not know what antibiotic or when she finished it.  She denies fever, chills, chest pain, or shortness of breath.  She denies a history of DVT.  Review of Systems  Positive: See HPI Negative: See HPI  Physical Exam  BP (!) 162/65 (BP Location: Right Arm)   Pulse 71   Temp 98 F (36.7 C) (Oral)   Resp 16   SpO2 96%  Gen:   Awake, no distress   Resp:  Normal effort LCTA MSK:   significant chronic venous stasis changes to bilateral lower extremities, large wound to left heel with foul-smelling purulent discharge, tenderness to palpation diffusely across foot, erythema extending from the foot up to the mid shin, able to palpate pulse but it is diminished, 1+ DP and PT on the left, 2+ DP and PT on the right, lower extremity with slightly decreased warmth but comparable on both sides and sensation is intact Other:  Alert and oriented, regular rate and rhythm  Medical Decision Making  Medically screening exam initiated at 10:41 AM.  Appropriate orders placed.  Lydia Myers was informed that the remainder of the evaluation will be completed by another provider, this initial triage assessment does not replace that evaluation, and the importance of remaining in the ED until their evaluation is complete.     Suzzette Righter, PA-C 01/15/23 1045

## 2023-01-15 NOTE — ED Notes (Signed)
ED TO INPATIENT HANDOFF REPORT  ED Nurse Name and Phone #: Tonette Bihari [phone]  S Name/Age/Gender Lydia Myers 82 y.o. female Room/Bed: WA18/WA18  Code Status   Code Status: Full Code  Home/SNF/Other Home Patient oriented to: self, place, time, and situation Is this baseline? Yes   Triage Complete: Triage complete  Chief Complaint Osteomyelitis [facility]) [M86.9]  Triage Note C/o "SOB and lower extremity swelling for years".  Pt reports new ulcer to the back of left leg and is open and draining with odor.  Pt reports needing to see nephrologist.  Denies increase sob, cp, dizziness.     Allergies Allergies  Allergen Reactions   Shrimp [Shellfish Allergy] Nausea And Vomiting   Shellfish-Derived Products     Other reaction(s): Unknown    Level of Care/Admitting Diagnosis ED Disposition     ED Disposition  Admit   Condition  --   Comment  Hospital Area: [place] [id number]  Level of Care: Telemetry [5]  Admit to tele based on following criteria: Other see comments  Comments: ostemyeltis  May admit patient to [facility] or [facility] if equivalent level of care is available:: Yes  Covid Evaluation: Asymptomatic - no recent exposure (last 10 days) testing not required  [facility]: [id number]  Admitting Physician: Clance Boll [id number]  Attending Physician: Clance Boll [id number]  Certification:: I certify this patient will need inpatient services for at least 2 midnights  Estimated Length of Stay: 3          B Medical/Surgery History Past Medical History:  Diagnosis Date   Arthritis    Asthma    Cardiomyopathy ([place])    COPD (chronic obstructive pulmonary disease) ([place])    Diabetes mellitus    Glaucoma    High cholesterol    Hypertension    Systolic dysfunction, left ventricle    Past Surgical History:  Procedure Laterality Date   A-FLUTTER ABLATION N/A 12/09/2019   Procedure: A-FLUTTER ABLATION;   Surgeon: Evans Lance, MD;  Location: [place] CV LAB;  Service: Cardiovascular;  Laterality: N/A;   ABDOMINAL HYSTERECTOMY     AV NODE ABLATION N/A 12/14/2019   Procedure: AV NODE ABLATION;  Surgeon: Evans Lance, MD;  Location: [place] CV LAB;  Service: Cardiovascular;  Laterality: N/A;   HIP SURGERY     JOINT REPLACEMENT     left lumpectomy     PACEMAKER IMPLANT N/A 12/14/2019   Procedure: PACEMAKER IMPLANT;  Surgeon: Evans Lance, MD;  Location: [place] [facility];  Service: Cardiovascular;  Laterality: N/A;   REPLACEMENT TOTAL KNEE BILATERAL     2009-Right, 2001-Left   Right bog toe fusion     Right trigger finger surgery       A IV Location/Drains/Wounds Patient Lines/Drains/Airways Status     Active Line/Drains/Airways     Name Placement date Placement time Site Days   Peripheral IV 01/15/23 20 G Posterior;Right Hand 01/15/23  1351  Hand  less than 1            Intake/Output Last 24 hours No intake or output data in the 24 hours ending 01/15/23 2033  Labs/Imaging Results for orders placed or performed during the hospital encounter of 01/15/23 (from the past 48 hour(s))  Comprehensive metabolic panel     Status: Abnormal   Collection Time: 01/15/23 10:55 AM  Result Value Ref Range   Sodium 137 135 - 145 mmol/L   Potassium 3.7 3.5 - 5.1  mmol/L   Chloride 102 98 - 111 mmol/L   CO2 24 22 - 32 mmol/L   Glucose, Bld 154 (H) 70 - 99 mg/dL    Comment: Glucose reference range applies only to samples taken after fasting for at least 8 hours.   BUN 29 (H) 8 - 23 mg/dL   Creatinine, Ser 2.20 (H) 0.44 - 1.00 mg/dL   Calcium 8.7 (L) 8.9 - 10.3 mg/dL   Total Protein 7.3 6.5 - 8.1 g/dL   Albumin 3.9 3.5 - 5.0 g/dL   AST 18 15 - 41 U/L   ALT 10 0 - 44 U/L   Alkaline Phosphatase 73 38 - 126 U/L   Total Bilirubin 0.7 0.3 - 1.2 mg/dL   GFR, Estimated 22 (L) >60 mL/min    Comment: (NOTE) Calculated using the CKD-EPI Creatinine Equation (2021)    Anion gap 11 5 -  15    Comment: Performed at [facility], [place] [address] [place], [place] [address]  CBC with Differential     Status: Abnormal   Collection Time: 01/15/23 10:55 AM  Result Value Ref Range   WBC 7.8 4.0 - 10.5 K/uL   RBC 3.88 3.87 - 5.11 MIL/uL   Hemoglobin 11.3 (L) 12.0 - 15.0 g/dL   HCT 36.8 36.0 - 46.0 %   MCV 94.8 80.0 - 100.0 fL   MCH 29.1 26.0 - 34.0 pg   MCHC 30.7 30.0 - 36.0 g/dL   RDW 13.5 11.5 - 15.5 %   Platelets 192 150 - 400 K/uL   nRBC 0.0 0.0 - 0.2 %   Neutrophils Relative % 75 %   Neutro Abs 5.9 1.7 - 7.7 K/uL   Lymphocytes Relative 13 %   Lymphs Abs 1.0 0.7 - 4.0 K/uL   Monocytes Relative 9 %   Monocytes Absolute 0.7 0.1 - 1.0 K/uL   Eosinophils Relative 1 %   Eosinophils Absolute 0.1 0.0 - 0.5 K/uL   Basophils Relative 1 %   Basophils Absolute 0.1 0.0 - 0.1 K/uL   Immature Granulocytes 1 %   Abs Immature Granulocytes 0.07 0.00 - 0.07 K/uL    Comment: Performed at [facility], [place] [address] [place], [place] [address]  Lactic acid     Status: None   Collection Time: 01/15/23 10:55 AM  Result Value Ref Range   Lactic Acid, Venous 1.2 0.5 - 1.9 mmol/L    Comment: Performed at [facility], [place] [address], [place], [place] [address]  Blood Cultures x 2 sites     Status: None (Preliminary result)   Collection Time: 01/15/23  1:38 PM   Specimen: BLOOD LEFT FOREARM  Result Value Ref Range   Specimen Description      BLOOD LEFT FOREARM Performed at [facility], [place] [address] [place], [place] [address]    Special Requests      BOTTLES DRAWN AEROBIC AND ANAEROBIC Blood Culture results may not be optimal due to an inadequate volume of blood received in culture bottles Performed at [facility], [place] [address], [place], [place] [address]    Culture PENDING    Report Status PENDING   Urinalysis, Routine w reflex microscopic -Urine, Clean Catch     Status: Abnormal   Collection  Time: 01/15/23  6:52 PM  Result Value Ref Range   Color, Urine YELLOW YELLOW   APPearance HAZY (A) CLEAR   Specific Gravity, Urine 1.010 1.005 - 1.030  pH 6.5 5.0 - 8.0   Glucose, UA NEGATIVE NEGATIVE mg/dL   Hgb urine dipstick SMALL (A) NEGATIVE   Bilirubin Urine NEGATIVE NEGATIVE   Ketones, ur NEGATIVE NEGATIVE mg/dL   Protein, ur NEGATIVE NEGATIVE mg/dL   Nitrite NEGATIVE NEGATIVE   Leukocytes,Ua LARGE (A) NEGATIVE    Comment: Performed at [facility], [place] [address] [place], [place] [address]  Urinalysis, Microscopic (reflex)     Status: Abnormal   Collection Time: 01/15/23  6:52 PM  Result Value Ref Range   RBC / HPF NONE SEEN 0 - 5 RBC/hpf   WBC, UA 6-10 0 - 5 WBC/hpf   Bacteria, UA MANY (A) NONE SEEN   Squamous Epithelial / HPF 0-5 0 - 5 /HPF   WBC Clumps PRESENT     Comment: Performed at [facility], [place] [address], [place], [place] [address]   DG Chest 2 View  Result Date: 01/15/2023 CLINICAL DATA:  Lower extremity edema EXAM: CHEST - 2 VIEW COMPARISON:  08/02/2020 FINDINGS: Chronic cardiomegaly. Dual lead pacemaker. Possible pulmonary venous hypertension but no frank edema. No visible effusion. Mild chronic scarring at the left lung base. IMPRESSION: Chronic cardiomegaly. Dual lead pacemaker. Possible pulmonary venous hypertension but no frank edema. Electronically Signed   By: Nelson Chimes M.D.   On: 01/15/2023 14:08   DG Foot Complete Left  Result Date: 01/15/2023 CLINICAL DATA:  Wound cellulitis back of left leg. EXAM: LEFT FOOT - COMPLETE 3+ VIEW COMPARISON:  Skip of cellulitis. FINDINGS: Bones are diffusely demineralized which limits assessment. Within this limitation, there is no evidence for an acute fracture or dislocation. No worrisome lytic or sclerotic osseous abnormality. Skin irregularity posterior ankle region may be related to the patient's described wound. There is some cortical irregularity of the calcaneal tuberosity  underlying this region. IMPRESSION: 1. Skin irregularity posterior ankle region may be related to the patient's described wound. There is some cortical irregularity of the calcaneal tuberosity underlying this region. Osteomyelitis a concern. MRI with and without contrast could be used to further evaluate as clinically warranted. Electronically Signed   By: Misty Stanley M.D.   On: 01/15/2023 11:45    Pending Labs Unresulted Labs (From admission, onward)     Start     Ordered   01/16/23 0500  CBC  Tomorrow morning,   R        01/15/23 1852   01/16/23 0500  Comprehensive metabolic panel  Tomorrow morning,   R        01/15/23 1852   01/15/23 1910  C-reactive protein  Once,   R        01/15/23 1909   01/15/23 1851  Hemoglobin A1c  (Glycemic Control (SSI)  Q 4 Hours / Glycemic Control (SSI)  AC +/- HS)  Once,   R       Comments: To assess prior glycemic control    01/15/23 1852   01/15/23 1252  Blood Cultures x 2 sites  BLOOD CULTURE X 2,   STAT      01/15/23 1252            Vitals/Pain Today's Vitals   01/15/23 1615 01/15/23 1645 01/15/23 1730 01/15/23 1800  BP: (!) 169/150 (!) 217/194 (!) 147/73 (!) 123/47  Pulse: 68 65 73 60  Resp:  16  16  Temp:    98 F (36.7 C)  TempSrc:      SpO2: 100% 98% 100% 100%  PainSc:  Isolation Precautions No active isolations  Medications Medications  atorvastatin (LIPITOR) tablet 10 mg (has no administration in time range)  metoprolol succinate (TOPROL-XL) 24 hr tablet 25 mg (has no administration in time range)  torsemide (DEMADEX) tablet 20 mg (has no administration in time range)  ALPRAZolam (XANAX) tablet 0.25 mg (has no administration in time range)  levothyroxine (SYNTHROID) tablet 88 mcg (has no administration in time range)  insulin aspart (novoLOG) injection 0-9 Units (has no administration in time range)  acetaminophen (TYLENOL) tablet 650 mg (has no administration in time range)    Or  acetaminophen (TYLENOL) suppository  650 mg (has no administration in time range)  ondansetron (ZOFRAN) tablet 4 mg (has no administration in time range)    Or  ondansetron (ZOFRAN) injection 4 mg (has no administration in time range)  albuterol (PROVENTIL) (2.5 MG/3ML) 0.083% nebulizer solution 2.5 mg (has no administration in time range)  amiodarone (PACERONE) tablet 200 mg (has no administration in time range)  vancomycin (VANCOREADY) IVPB 750 mg/150 mL (has no administration in time range)  ceFEPIme (MAXIPIME) 2 g in sodium chloride 0.9 % 100 mL IVPB (has no administration in time range)  vancomycin (VANCOCIN) IVPB 1000 mg/200 mL premix (0 mg Intravenous Stopped 01/15/23 1607)  piperacillin-tazobactam (ZOSYN) IVPB 3.375 g (0 g Intravenous Stopped 01/15/23 1507)    Mobility walks     Focused Assessments    R Recommendations: See Admitting Provider Note  Report given to:   Additional Notes:

## 2023-01-15 NOTE — ED Triage Notes (Signed)
C/o "SOB and lower extremity swelling for years".  Pt reports new ulcer to the back of left leg and is open and draining with odor.  Pt reports needing to see nephrologist.  Denies increase sob, cp, dizziness.

## 2023-01-15 NOTE — ED Provider Notes (Signed)
[place] AT [facility] Provider Note   CSN: [id number] Arrival date & time: 01/15/23  1001     History  Chief Complaint  Patient presents with   Leg Swelling    Lydia Myers is a 82 y.o. female with past medical history hypertension, hyperlipidemia, diabetes, cardiomyopathy who complains of wounds to the left heel and ankle.  Patient states that it has been there for years but has been worsening over the last 3 weeks and is now painful with foul-smelling discharge.  She does have a history of diabetes but is unsure what her blood glucose has been at home.  She states she is compliant with her medications and has been on antibiotics recently.  She does not know what antibiotic or when she finished it.  She denies fever, chills, chest pain, or shortness of breath.  She denies a history of DVT/PE.  Difficult to obtain history as patient is a poor historian he does not seem to have good insight into her medical history.    Home Medications Prior to Admission medications   Medication Sig Start Date End Date Taking? Authorizing Provider  ACCU-CHEK GUIDE test strip USE TO CHECK FINGER STICK BLOOD SUGAR TWICE DAILY 01/03/21   [provider]  Accu-Chek Softclix Lancets lancets 2 (two) times daily. 01/03/21   [provider]  acetaminophen (TYLENOL) 325 MG tablet Take 1-2 tablets (325-650 mg total) by mouth every 4 (four) hours as needed for mild pain. 12/15/19   Shirley Friar, PA-C  ALPRAZolam Duanne Moron) 0.25 MG tablet Take 0.25 mg by mouth daily as needed. 06/22/20   [provider]  amiodarone (PACERONE) 200 MG tablet Take one tablet by mouth Monday through Saturday. DO NOT TAKE on SUNDAY. 12/07/22   Evans Lance, MD  atorvastatin (LIPITOR) 10 MG tablet TAKE 1 TABLET BY MOUTH EVERY DAY AT [facility] 06/01/19   Sherran Needs, NP  AZOPT 1 % ophthalmic suspension Place 1 drop into both eyes 2 (two) times daily. 05/01/17   [provider]  BD PEN NEEDLE NANO 2ND GEN 32G X 4 MM MISC USE ONCE A DAY WITH BASAGLAR KWIK PEN 04/08/21   [provider]  Blood Glucose Monitoring Suppl (ACCU-CHEK GUIDE) w/Device KIT USE TO CHECK BLOOD SUGAR 2 TIMES A DAY 01/03/21   [provider]  doxycycline (DORYX) 100 MG EC tablet Take 1 tablet (100 mg total) by mouth 2 (two) times daily. 01/10/23   Elgie Collard, PA-C  Lactobacillus-Inulin (CULTURELLE DIGESTIVE DAILY) CAPS Take one capsule twice daily for 21 days. 09/11/21   Marzetta Board, DPM  LEVEMIR FLEXTOUCH 100 UNIT/ML FlexPen Inject 100 mLs into the skin daily. 03/29/21   [provider]  levothyroxine (SYNTHROID) 88 MCG tablet Take 88 mcg by mouth every morning. 12/31/20   [provider]  losartan (COZAAR) 25 MG tablet Take 1 tablet by mouth daily.    [provider]  metoprolol succinate (TOPROL-XL) 25 MG 24 hr tablet Take 1 tablet by mouth daily.    [provider]  Miconazole Nitrate 2 % AERP Spray on plantar aspect of left foot once daily 09/06/21   Marzetta Board, DPM  olmesartan (BENICAR) 40 MG tablet Take 40 mg by mouth daily. 11/27/19   [provider]  Rivaroxaban (XARELTO) 15 MG TABS tablet TAKE 1 TABLET BY MOUTH EVERY DAY WITH DINNER 12/07/22   Evans Lance, MD  sitaGLIPtin (JANUVIA) 100 MG tablet 1 tablet  08/28/19   [provider]  torsemide (DEMADEX) 20 MG tablet Take 1 tablet (20 mg total) by mouth 2 (two) times daily. 12/07/22   Evans Lance, MD  TRAVATAN Z 0.004 % SOLN ophthalmic solution Place 1 drop into both eyes every evening. 05/01/17   [provider]      Allergies    Shrimp [shellfish allergy] and Shellfish-derived products    Review of Systems   Review of Systems  Unable to perform ROS: Other    Physical Exam Updated Vital Signs BP (!) 162/65 (BP Location: Right Arm)   Pulse 71   Temp 98 F (36.7 C) (Oral)   Resp 16   SpO2 96%  Physical Exam Vitals and  nursing note reviewed.  Constitutional:      General: She is not in acute distress.    Appearance: Normal appearance. She is not toxic-appearing.  HENT:     Head: Normocephalic and atraumatic.     Mouth/Throat:     Mouth: Mucous membranes are moist.  Eyes:     Conjunctiva/sclera: Conjunctivae normal.  Cardiovascular:     Rate and Rhythm: Normal rate and regular rhythm.     Heart sounds: No murmur heard. Pulmonary:     Effort: Pulmonary effort is normal. No respiratory distress.     Breath sounds: Normal breath sounds. No stridor. No wheezing, rhonchi or rales.  Abdominal:     General: Abdomen is flat. There is no distension.     Palpations: Abdomen is soft.     Tenderness: There is no abdominal tenderness. There is no right CVA tenderness, left CVA tenderness, guarding or rebound.  Musculoskeletal:     Cervical back: Normal range of motion and neck supple.     Comments: Significant chronic venous stasis changes/dry skin to bilateral lower extremities, large wound to left heel extending to posterior ankle with foul-smelling purulent discharge, tenderness to palpation diffusely across foot and ankle, erythema extending from the foot up to the mid shin, able to palpate pulse in triage but it was diminished, 1+ DP and PT on the left, 2+ DP and PT on the right, lower extremities with slightly decreased warmth but comparable on both sides and sensation is intact  Skin:    General: Skin is warm and dry.     Capillary Refill: Capillary refill takes less than 2 seconds.  Neurological:     Mental Status: She is alert. Mental status is at baseline.  Psychiatric:        Behavior: Behavior normal.     ED Results / Procedures / Treatments   Labs (all labs ordered are listed, but only abnormal results are displayed) Labs Reviewed  COMPREHENSIVE METABOLIC PANEL - Abnormal; Notable for the following components:      Result Value   Glucose, Bld 154 (*)    BUN 29 (*)    Creatinine, Ser 2.20 (*)     Calcium 8.7 (*)    GFR, Estimated 22 (*)    All other components within normal limits  CBC WITH DIFFERENTIAL/PLATELET - Abnormal; Notable for the following components:   Hemoglobin 11.3 (*)    All other components within normal limits  CULTURE, BLOOD (ROUTINE X 2)  CULTURE, BLOOD (ROUTINE X 2)  LACTIC ACID, PLASMA  CBG MONITORING, ED    EKG None  Radiology DG Foot Complete Left  Result Date: 01/15/2023 CLINICAL DATA:  Wound cellulitis back of left leg. EXAM: LEFT FOOT - COMPLETE 3+ VIEW COMPARISON:  Skip of  cellulitis. FINDINGS: Bones are diffusely demineralized which limits assessment. Within this limitation, there is no evidence for an acute fracture or dislocation. No worrisome lytic or sclerotic osseous abnormality. Skin irregularity posterior ankle region may be related to the patient's described wound. There is some cortical irregularity of the calcaneal tuberosity underlying this region. IMPRESSION: 1. Skin irregularity posterior ankle region may be related to the patient's described wound. There is some cortical irregularity of the calcaneal tuberosity underlying this region. Osteomyelitis a concern. MRI with and without contrast could be used to further evaluate as clinically warranted. Electronically Signed   By: Misty Stanley M.D.   On: 01/15/2023 11:45    Procedures Procedures    Medications Ordered in ED Medications - No data to display  ED Course/ Medical Decision Making/ A&P                             Medical Decision Making Amount and/or Complexity of Data Reviewed Labs: ordered. Decision-making details documented in ED Course. Radiology: ordered. Decision-making details documented in ED Course.  Risk Decision regarding hospitalization.   Medical Decision Making:   Terron Flesner Fuhriman is a 82 y.o. female who presented to the ED today with left foot pain detailed above.    Additional history discussed with patient's family/caregivers.  Patient's presentation  is complicated by their history of diabetes, recent antibiotic use, cardiomyopathy, age.  Complete initial physical exam performed, notably the patient was in no acute distress with stable vital signs. She had large wound to heel of left foot extending to posterior left ankle with foul smelling drainage. Erythema extending to mid shin. Significant venous stasis changes to bilateral lower extremities. In triage, DP and PT pulses present but weaker on left when compared to right. On initial exam in bed in main ED, 2+ pulses palpated bilaterally and good pulse found on Doppler. No tachycardia, no respiratory distress, pt neurologically intact.    Reviewed and confirmed nursing documentation for past medical history, family history, social history.    Initial Assessment:   With the patient's presentation of lower extremity wound, most likely diagnosis is osteomyelitis. Differential diagnosis includes but is not limited to acute wound infection, cellulitis, heart failure, ischemic limb, PVD, PAD, DVT, sepsis.    This is most consistent with an acute complicated illness  Initial Plan:  Screening labs including CBC and Metabolic panel to evaluate for infectious or metabolic etiology of disease.  Lactic acid to assess for sepsis  Blood cultures  X-ray to evaluate for osteomyelitis or other bony pathology  CXR to evaluate for structural/infectious intrathoracic pathology.  Objective evaluation as reviewed   Initial Study Results:   Laboratory  All laboratory results reviewed without evidence of clinically relevant pathology.   Exceptions include: Glucose 154, creatinine 2.2  Radiology:  All images reviewed independently. Agree with radiology report at this time.   DG Foot Complete Left  Result Date: 01/15/2023 CLINICAL DATA:  Wound cellulitis back of left leg. EXAM: LEFT FOOT - COMPLETE 3+ VIEW COMPARISON:  Skip of cellulitis. FINDINGS: Bones are diffusely demineralized which limits assessment.  Within this limitation, there is no evidence for an acute fracture or dislocation. No worrisome lytic or sclerotic osseous abnormality. Skin irregularity posterior ankle region may be related to the patient's described wound. There is some cortical irregularity of the calcaneal tuberosity underlying this region. IMPRESSION: 1. Skin irregularity posterior ankle region may be related to the patient's described wound. There  is some cortical irregularity of the calcaneal tuberosity underlying this region. Osteomyelitis a concern. MRI with and without contrast could be used to further evaluate as clinically warranted. Electronically Signed   By: Misty Stanley M.D.   On: 01/15/2023 11:45      Consults: Case discussed with hospitalist, Dr. Marcello Moores, who accepts admission the patient..   Final Assessment and Plan:   This is an 82 year old female presenting to the ED complaining of wound infection to the left foot and ankle.  She reports that wound has been present likely for years but has been worsening over the last few weeks and developed foul-smelling discharge and significant pain.  She denies any other associated symptoms such as fever, chills, nausea, vomiting, chest pain, or shortness of breath.  On exam, patient has a large wound from the heel of the left foot extending to the posterior ankle with moderate amounts of foul-smelling discharge and erythema that goes up to the mid shin.  She has significant chronic venous stasis changes to bilateral lower extremities.  I initially evaluated patient in triage for medical screening exam and palpable pulse was present but was weak.  On initial exam in the main ED, orders obtained for MSE screening have returned patient has suspected osteomyelitis to the left foot with recommendation for MRI imaging for further assessment.  On assessment in the main ED, I am able to palpate DP and PT pulses bilaterally 2+ which are stronger than on initial assessment.  Slightly  decreased warmth to bilateral lower extremities but this does appear to be symmetric. Regular rate and rhythm, lungs clear to auscultation, no signs respiratory distress. Patient has stable vital signs and is afebrile with no tachycardia.  She is slightly hypertensive.  She does not currently meet sepsis criteria.  Blood work obtained mostly reassuring.  She does have glucose level of 154.  She reports to me she is unsure what her glucose levels run at home.  She does have a creatinine of 2.2 which appears similar to her baseline.  No leukocytosis.  No significant electrolyte disturbance.  Patient denied having a pacemaker to me.  However, on chart review after MRI was ordered it does appear that she has a pacemaker.  Will leave further imaging up to hospitalist team.  Hospitalist was consulted and accepts admission.  Blood cultures were obtained and IV antibiotics started for treatment of osteomyelitis.  Patient stable at time of admission.    Clinical Impression:  1. Osteomyelitis of ankle or foot, acute, left ([place])      Admit           Final Clinical Impression(s) / ED Diagnoses Final diagnoses:  Osteomyelitis of ankle or foot, acute, left [facility])    Rx / DC Orders ED Discharge Orders     None         Suzzette Righter, PA-C 01/15/23 1444    Isla Pence, MD 01/15/23 1523

## 2023-01-15 NOTE — Progress Notes (Signed)
Pharmacy Antibiotic Note  Lydia Myers is a 82 y.o. female admitted on 01/15/2023 with  wound infection/osteo .  Pharmacy has been consulted for Vanco, Cefepime dosing.  Active Problem(s): leg wound left leg with foul odor and drainage   PMH: Arthritis, COPD/Asthma, DMII, PAF on xarelto, amiodarone, CHF, CHB status post PPM, CKDIIIb-IV Hypertension ,hypothyroidism, chronic venostasis, glaucoma   ID: Potential Osteomyelitis of Left calcaneal tuberosity on Xray Afebrile, WBC WNL, Scr 2.2 - Last weight 196lb in 03/2021  Vanco 3/12>> Zosyn 3/12>>  Plan: - New Height/weight ordered - Vanco 1g IV x 1 in ED then Vancomycin 750 mg IV Q 48 hrs. Goal AUC 400-550. Expected AUC: 460, SCr used: 2.2 - Change Zosyn to Cefepime 2g IV q24h       Temp (24hrs), Avg:98 F (36.7 C), Min:98 F (36.7 C), Max:98 F (36.7 C)  Recent Labs  Lab 01/15/23 1055  WBC 7.8  CREATININE 2.20*  LATICACIDVEN 1.2    CrCl cannot be calculated (Unknown ideal weight.).    Allergies  Allergen Reactions   Shrimp [Shellfish Allergy] Nausea And Vomiting   Shellfish-Derived Products     Other reaction(s): Unknown    Devinn Voshell S. Alford Highland, PharmD, BCPS Clinical Staff Pharmacist [email or web address]  Wayland Salinas 01/15/2023 7:43 PM

## 2023-01-16 DIAGNOSIS — E1122 Type 2 diabetes mellitus with diabetic chronic kidney disease: Secondary | ICD-10-CM

## 2023-01-16 DIAGNOSIS — E039 Hypothyroidism, unspecified: Secondary | ICD-10-CM

## 2023-01-16 DIAGNOSIS — F419 Anxiety disorder, unspecified: Secondary | ICD-10-CM

## 2023-01-16 DIAGNOSIS — S81802A Unspecified open wound, left lower leg, initial encounter: Secondary | ICD-10-CM

## 2023-01-16 DIAGNOSIS — I480 Paroxysmal atrial fibrillation: Secondary | ICD-10-CM | POA: Diagnosis not present

## 2023-01-16 DIAGNOSIS — J449 Chronic obstructive pulmonary disease, unspecified: Secondary | ICD-10-CM

## 2023-01-16 DIAGNOSIS — Z794 Long term (current) use of insulin: Secondary | ICD-10-CM

## 2023-01-16 DIAGNOSIS — I4891 Unspecified atrial fibrillation: Secondary | ICD-10-CM

## 2023-01-16 DIAGNOSIS — M869 Osteomyelitis, unspecified: Secondary | ICD-10-CM | POA: Diagnosis not present

## 2023-01-16 DIAGNOSIS — I10 Essential (primary) hypertension: Secondary | ICD-10-CM

## 2023-01-16 DIAGNOSIS — N1832 Chronic kidney disease, stage 3b: Secondary | ICD-10-CM

## 2023-01-16 DIAGNOSIS — I5032 Chronic diastolic (congestive) heart failure: Secondary | ICD-10-CM

## 2023-01-16 LAB — COMPREHENSIVE METABOLIC PANEL
ALT: 11 U/L (ref 0–44)
AST: 16 U/L (ref 15–41)
Albumin: 3.8 g/dL (ref 3.5–5.0)
Alkaline Phosphatase: 63 U/L (ref 38–126)
Anion gap: 9 (ref 5–15)
BUN: 27 mg/dL — ABNORMAL HIGH (ref 8–23)
CO2: 26 mmol/L (ref 22–32)
Calcium: 8.6 mg/dL — ABNORMAL LOW (ref 8.9–10.3)
Chloride: 101 mmol/L (ref 98–111)
Creatinine, Ser: 2.29 mg/dL — ABNORMAL HIGH (ref 0.44–1.00)
GFR, Estimated: 21 mL/min — ABNORMAL LOW (ref >60)
Glucose, Bld: 174 mg/dL — ABNORMAL HIGH (ref 70–99)
Potassium: 3.8 mmol/L (ref 3.5–5.1)
Sodium: 136 mmol/L (ref 135–145)
Total Bilirubin: 0.8 mg/dL (ref 0.3–1.2)
Total Protein: 6.5 g/dL (ref 6.5–8.1)

## 2023-01-16 LAB — C-REACTIVE PROTEIN: CRP: 0.6 mg/dL (ref <1.0)

## 2023-01-16 LAB — CBC
HCT: 34.3 % — ABNORMAL LOW (ref 36.0–46.0)
Hemoglobin: 10.8 g/dL — ABNORMAL LOW (ref 12.0–15.0)
MCH: 29.5 pg (ref 26.0–34.0)
MCHC: 31.5 g/dL (ref 30.0–36.0)
MCV: 93.7 fL (ref 80.0–100.0)
Platelets: 216 10*3/uL (ref 150–400)
RBC: 3.66 MIL/uL — ABNORMAL LOW (ref 3.87–5.11)
RDW: 13.4 % (ref 11.5–15.5)
WBC: 9.2 10*3/uL (ref 4.0–10.5)
nRBC: 0 % (ref 0.0–0.2)

## 2023-01-16 LAB — GLUCOSE, CAPILLARY
Glucose-Capillary: 149 mg/dL — ABNORMAL HIGH (ref 70–99)
Glucose-Capillary: 151 mg/dL — ABNORMAL HIGH (ref 70–99)
Glucose-Capillary: 148 mg/dL — ABNORMAL HIGH (ref 70–99)
Glucose-Capillary: 135 mg/dL — ABNORMAL HIGH (ref 70–99)

## 2023-01-16 LAB — HEMOGLOBIN A1C
Hgb A1c MFr Bld: 8.8 % — ABNORMAL HIGH (ref 4.8–5.6)
Mean Plasma Glucose: 206 mg/dL

## 2023-01-16 MED ORDER — HYDRALAZINE HCL 20 MG/ML IJ SOLN: 5.0000 mg | Freq: Four times a day (QID) | INTRAMUSCULAR | Status: DC | PRN

## 2023-01-16 MED ORDER — HEPARIN SODIUM (PORCINE) 5000 UNIT/ML IJ SOLN
5000.0000 [IU] | Freq: Three times a day (TID) | INTRAMUSCULAR | Status: DC
  Administered 2023-01-16 – 2023-01-17 (×2): 5000 [IU] via SUBCUTANEOUS
  Filled 2023-01-16 (×2): qty 1

## 2023-01-16 MED ORDER — ORAL CARE MOUTH RINSE
15.0000 mL | OROMUCOSAL | Status: DC
  Administered 2023-01-16 – 2023-01-23 (×17): 15 mL via OROMUCOSAL

## 2023-01-16 MED ORDER — ORAL CARE MOUTH RINSE: 15.0000 mL | OROMUCOSAL | Status: DC | PRN

## 2023-01-16 MED ORDER — OLANZAPINE 5 MG PO TBDP
5.0000 mg | ORAL_TABLET | Freq: Once | ORAL | Status: AC
  Administered 2023-01-16: 5 mg via ORAL
  Filled 2023-01-16: qty 1

## 2023-01-16 MED ORDER — HALOPERIDOL LACTATE 5 MG/ML IJ SOLN
2.5000 mg | Freq: Once | INTRAMUSCULAR | Status: AC
  Administered 2023-01-16: 2.5 mg via INTRAVENOUS
  Filled 2023-01-16: qty 1

## 2023-01-16 MED ORDER — MEDIHONEY WOUND/BURN DRESSING EX PSTE
1.0000 | PASTE | Freq: Every day | CUTANEOUS | Status: DC
  Administered 2023-01-16: 1 via TOPICAL
  Filled 2023-01-16: qty 44

## 2023-01-16 NOTE — Consult Note (Addendum)
WOC Nurse Consult Note: Reason for Consult: Consult requested for left leg wound Wound type: Left posterior calf/ankle with full thickness wound, 100% grey moist nonviable skin, mod amt tan drainage, strong foul odor, 7X8X.1cm.  X-ray indicates; "Osteomyelitis a concern" and MRI is pending.  If this complex medical condition is indicated, please refer to ortho team for further plan of care.  Dressing procedure/placement/frequency: Topical treatment orders provided for bedside nurses to perform as follows to assist with removal of nonviable tissue: Apply Medihoney to left posterior leg Q day, then cover with abd pad and kerlex. Please re-consult if further assistance is needed.  Thank-you,  Julien Girt MSN, [place], [place], [place], [place]

## 2023-01-16 NOTE — Progress Notes (Signed)
Patient attempting to leave, primary RN immediately at bedside to assist.  Patient proceed to hit primary RN by striking the right shoulder. Forearm multiple times.    Patient assisted safely to the bed.  Patient confused and agitated.   Possey belt applied to help ensure safety.

## 2023-01-16 NOTE — Consult Note (Signed)
Reason for Consult: Left lower leg wound, probable calcaneal tuberosity osteomyelitis by x-ray Referring Physician: Myles Rosenthal MD   Lydia Myers is an 82 y.o. female.  HPI: Patient presented to the emergency department yesterday for evaluation of a left lower extremity wound that had recently increased in size.  She also noted worsening drainage and malodor.  She states the wound has been present for several years.  She notes chronic skin changes to the bilateral lower extremities.  She has had a history of venous stasis.  She notes pain about the posterior aspect of the lower leg in the region of the wound.  She denies any fever, chills, or night sweats.  It does cause difficulty and pain with ambulation.  She does live alone.  She has been followed by cardiology in the past but per her report is never seen vascular.  She did undergo radiographs in the emergency department where there was lucency about the calcaneal tuberosity suspicious for osteomyelitis.  She is unable to undergo an MRI scan due to implanted pacemaker.  Orthopedics was consulted for further evaluation and management.  Currently, patient reports her pain is well-controlled.  She is keeping the wound wrapped.  She denies feeling systemically ill.  Her white blood cell count is within normal limits.  Past Medical History:  Diagnosis Date   Arthritis    Asthma    Cardiomyopathy ([place])    COPD (chronic obstructive pulmonary disease) (HCC)    Diabetes mellitus    Glaucoma    High cholesterol    Hypertension    Systolic dysfunction, left ventricle     Past Surgical History:  Procedure Laterality Date   A-FLUTTER ABLATION N/A 12/09/2019   Procedure: A-FLUTTER ABLATION;  Surgeon: Evans Lance, MD;  Location: [facility];  Service: Cardiovascular;  Laterality: N/A;   ABDOMINAL HYSTERECTOMY     AV NODE ABLATION N/A 12/14/2019   Procedure: AV NODE ABLATION;  Surgeon: Evans Lance, MD;  Location: [facility];   Service: Cardiovascular;  Laterality: N/A;   HIP SURGERY     JOINT REPLACEMENT     left lumpectomy     PACEMAKER IMPLANT N/A 12/14/2019   Procedure: PACEMAKER IMPLANT;  Surgeon: Evans Lance, MD;  Location: [facility];  Service: Cardiovascular;  Laterality: N/A;   REPLACEMENT TOTAL KNEE BILATERAL     2009-Right, 2001-Left   Right bog toe fusion     Right trigger finger surgery      Family History  Problem Relation Age of Onset   Diabetes Brother     Social History:  reports that she has never smoked. She has never used smokeless tobacco. She reports that she does not drink alcohol and does not use drugs.  Allergies:  Allergies  Allergen Reactions   Shrimp [Shellfish Allergy] Nausea And Vomiting   Shellfish-Derived Products     Other reaction(s): Unknown    Medications: I have reviewed the patient's current medications.  Results for orders placed or performed during the hospital encounter of 01/15/23 (from the past 48 hour(s))  Comprehensive metabolic panel     Status: Abnormal   Collection Time: 01/15/23 10:55 AM  Result Value Ref Range   Sodium 137 135 - 145 mmol/L   Potassium 3.7 3.5 - 5.1 mmol/L   Chloride 102 98 - 111 mmol/L   CO2 24 22 - 32 mmol/L   Glucose, Bld 154 (H) 70 - 99 mg/dL    Comment: Glucose reference  range applies only to samples taken after fasting for at least 8 hours.   BUN 29 (H) 8 - 23 mg/dL   Creatinine, Ser 2.20 (H) 0.44 - 1.00 mg/dL   Calcium 8.7 (L) 8.9 - 10.3 mg/dL   Total Protein 7.3 6.5 - 8.1 g/dL   Albumin 3.9 3.5 - 5.0 g/dL   AST 18 15 - 41 U/L   ALT 10 0 - 44 U/L   Alkaline Phosphatase 73 38 - 126 U/L   Total Bilirubin 0.7 0.3 - 1.2 mg/dL   GFR, Estimated 22 (L) >60 mL/min    Comment: (NOTE) Calculated using the CKD-EPI Creatinine Equation (2021)    Anion gap 11 5 - 15    Comment: Performed at [facility], [place] [address] [place], [place] [address]  CBC with Differential     Status: Abnormal    Collection Time: 01/15/23 10:55 AM  Result Value Ref Range   WBC 7.8 4.0 - 10.5 K/uL   RBC 3.88 3.87 - 5.11 MIL/uL   Hemoglobin 11.3 (L) 12.0 - 15.0 g/dL   HCT 36.8 36.0 - 46.0 %   MCV 94.8 80.0 - 100.0 fL   MCH 29.1 26.0 - 34.0 pg   MCHC 30.7 30.0 - 36.0 g/dL   RDW 13.5 11.5 - 15.5 %   Platelets 192 150 - 400 K/uL   nRBC 0.0 0.0 - 0.2 %   Neutrophils Relative % 75 %   Neutro Abs 5.9 1.7 - 7.7 K/uL   Lymphocytes Relative 13 %   Lymphs Abs 1.0 0.7 - 4.0 K/uL   Monocytes Relative 9 %   Monocytes Absolute 0.7 0.1 - 1.0 K/uL   Eosinophils Relative 1 %   Eosinophils Absolute 0.1 0.0 - 0.5 K/uL   Basophils Relative 1 %   Basophils Absolute 0.1 0.0 - 0.1 K/uL   Immature Granulocytes 1 %   Abs Immature Granulocytes 0.07 0.00 - 0.07 K/uL    Comment: Performed at [facility], [place] [address], [place], [place] [address]  Lactic acid     Status: None   Collection Time: 01/15/23 10:55 AM  Result Value Ref Range   Lactic Acid, Venous 1.2 0.5 - 1.9 mmol/L    Comment: Performed at [facility], [place] [address], [place], [place] [address]  Blood Cultures x 2 sites     Status: None (Preliminary result)   Collection Time: 01/15/23  1:38 PM   Specimen: BLOOD LEFT FOREARM  Result Value Ref Range   Specimen Description      BLOOD LEFT FOREARM Performed at [facility], [place] [address] [place], [place] [address]    Special Requests      BOTTLES DRAWN AEROBIC AND ANAEROBIC Blood Culture results may not be optimal due to an inadequate volume of blood received in culture bottles Performed at [facility], [place] [address], [place], [place] [address]    Culture PENDING    Report Status PENDING   Urinalysis, Routine w reflex microscopic -Urine, Clean Catch     Status: Abnormal   Collection Time: 01/15/23  6:52 PM  Result Value Ref Range   Color, Urine YELLOW YELLOW   APPearance HAZY (A) CLEAR   Specific Gravity, Urine 1.010 1.005 -  1.030   pH 6.5 5.0 - 8.0   Glucose, UA NEGATIVE NEGATIVE mg/dL   Hgb urine dipstick SMALL (A) NEGATIVE   Bilirubin Urine NEGATIVE NEGATIVE   Ketones, ur NEGATIVE NEGATIVE mg/dL  Protein, ur NEGATIVE NEGATIVE mg/dL   Nitrite NEGATIVE NEGATIVE   Leukocytes,Ua LARGE (A) NEGATIVE    Comment: Performed at [facility], [place] [address], [place], [place] [address]  Urinalysis, Microscopic (reflex)     Status: Abnormal   Collection Time: 01/15/23  6:52 PM  Result Value Ref Range   RBC / HPF NONE SEEN 0 - 5 RBC/hpf   WBC, UA 6-10 0 - 5 WBC/hpf   Bacteria, UA MANY (A) NONE SEEN   Squamous Epithelial / HPF 0-5 0 - 5 /HPF   WBC Clumps PRESENT     Comment: Performed at [facility], [place] [address], [place], [place] [address]  Glucose, capillary     Status: Abnormal   Collection Time: 01/15/23  9:24 PM  Result Value Ref Range   Glucose-Capillary 181 (H) 70 - 99 mg/dL    Comment: Glucose reference range applies only to samples taken after fasting for at least 8 hours.  CBC     Status: Abnormal   Collection Time: 01/16/23 12:47 AM  Result Value Ref Range   WBC 9.2 4.0 - 10.5 K/uL   RBC 3.66 (L) 3.87 - 5.11 MIL/uL   Hemoglobin 10.8 (L) 12.0 - 15.0 g/dL   HCT 34.3 (L) 36.0 - 46.0 %   MCV 93.7 80.0 - 100.0 fL   MCH 29.5 26.0 - 34.0 pg   MCHC 31.5 30.0 - 36.0 g/dL   RDW 13.4 11.5 - 15.5 %   Platelets 216 150 - 400 K/uL   nRBC 0.0 0.0 - 0.2 %    Comment: Performed at [facility], [place] [address] [place] [address], [place], [place] [address]  Comprehensive metabolic panel     Status: Abnormal   Collection Time: 01/16/23 12:47 AM  Result Value Ref Range   Sodium 136 135 - 145 mmol/L   Potassium 3.8 3.5 - 5.1 mmol/L   Chloride 101 98 - 111 mmol/L   CO2 26 22 - 32 mmol/L   Glucose, Bld 174 (H) 70 - 99 mg/dL    Comment: Glucose reference range applies only to samples taken after fasting for at least 8 hours.   BUN 27 (H) 8 - 23 mg/dL   Creatinine, Ser 2.29  (H) 0.44 - 1.00 mg/dL   Calcium 8.6 (L) 8.9 - 10.3 mg/dL   Total Protein 6.5 6.5 - 8.1 g/dL   Albumin 3.8 3.5 - 5.0 g/dL   AST 16 15 - 41 U/L   ALT 11 0 - 44 U/L   Alkaline Phosphatase 63 38 - 126 U/L   Total Bilirubin 0.8 0.3 - 1.2 mg/dL   GFR, Estimated 21 (L) >60 mL/min    Comment: (NOTE) Calculated using the CKD-EPI Creatinine Equation (2021)    Anion gap 9 5 - 15    Comment: Performed at [facility], [place] [address], [place], [place] [address]  C-reactive protein     Status: None   Collection Time: 01/16/23 12:47 AM  Result Value Ref Range   CRP 0.6 <1.0 mg/dL    Comment: Performed at [facility], [place] [address] [place], [place] [address]    DG Chest 2 View  Result Date: 01/15/2023 CLINICAL DATA:  Lower extremity edema EXAM: CHEST - 2 VIEW COMPARISON:  08/02/2020 FINDINGS: Chronic cardiomegaly. Dual lead pacemaker. Possible pulmonary venous hypertension but no frank edema. No visible effusion. Mild chronic scarring at the left lung base. IMPRESSION: Chronic cardiomegaly. Dual lead pacemaker. Possible pulmonary venous  hypertension but no frank edema. Electronically Signed   By: Nelson Chimes M.D.   On: 01/15/2023 14:08   DG Foot Complete Left  Result Date: 01/15/2023 CLINICAL DATA:  Wound cellulitis back of left leg. EXAM: LEFT FOOT - COMPLETE 3+ VIEW COMPARISON:  Skip of cellulitis. FINDINGS: Bones are diffusely demineralized which limits assessment. Within this limitation, there is no evidence for an acute fracture or dislocation. No worrisome lytic or sclerotic osseous abnormality. Skin irregularity posterior ankle region may be related to the patient's described wound. There is some cortical irregularity of the calcaneal tuberosity underlying this region. IMPRESSION: 1. Skin irregularity posterior ankle region may be related to the patient's described wound. There is some cortical irregularity of the calcaneal tuberosity underlying this region.  Osteomyelitis a concern. MRI with and without contrast could be used to further evaluate as clinically warranted. Electronically Signed   By: Misty Stanley M.D.   On: 01/15/2023 11:45    Review of Systems  Constitutional:  Negative for chills and fever.  HENT:  Negative for drooling, facial swelling and trouble swallowing.   Respiratory:  Negative for cough and wheezing.   Cardiovascular:  Positive for leg swelling.  Skin:  Positive for wound.  Neurological:  Negative for dizziness, seizures, syncope and speech difficulty.  Psychiatric/Behavioral:  Negative for agitation, behavioral problems and confusion.    Blood pressure 130/76, pulse 68, temperature (!) 97.4 F (36.3 C), temperature source Oral, resp. rate 18, height '5\' 4"'$  (1.626 m), weight 85.4 kg, SpO2 100 %. Physical Exam Constitutional:      General: She is not in acute distress.    Appearance: Normal appearance. She is not toxic-appearing.  HENT:     Head: Normocephalic and atraumatic.     Mouth/Throat:     Mouth: Mucous membranes are moist.  Eyes:     General: No scleral icterus.    Extraocular Movements: Extraocular movements intact.  Pulmonary:     Effort: Pulmonary effort is normal.  Abdominal:     General: Abdomen is flat.  Musculoskeletal:     Comments: Examination of the bilateral lower extremities demonstrates diffuse chronic skin changes from the proximal tibia and extends distally to the feet.  There is thickening of the skin.  There is diffuse bilateral lower extremity swelling.  On the left, there is posterior wound about the lower leg with mild drainage and malodor.  She has tenderness to palpation.  Wound does not appear to probe deeply.  There is no obvious exposed subcutaneous tissue or bone.  She is able to plantarflex the ankles, but with some pain on the left.  She is able to wiggle the toes.  She is able to gently flex and extend the knee.  She endorses intact sensation to light touch.  She does have palpable  pulses in both feet.    Neurological:     General: No focal deficit present.     Mental Status: She is alert and oriented to person, place, and time.  Psychiatric:        Mood and Affect: Mood normal.        Behavior: Behavior normal.     Assessment/Plan: Left posterior leg wound, concerning for septic tenosynovitis probable osteomyelitis provided findings the calcaneus by radiographs Chronic bilateral venous stasis  We had a lengthy discussion regarding her overall situation.  We discussed that this is a limb threatening condition at this point.  Fortunately she does not appear systemically ill.  She is unable to  undergo an MRI due to implanted pacemaker.  I do think the nature of the wound is vascular in nature.  She has a history of venous stasis and had had this wound for several years per her report but it has recently worsened.  For this, we would recommend vascular consultation.   We discussed she may have deep infection may not be amendable to IV antibiotics and may necessitate amputation.  She was understanding and willing to consider this if indicated.  We will continue to follow and await vascular recommendations and workup.  Agree with antibiotics in the meantime.  Graesyn Schreifels J [place] 01/16/2023, 8:23 AM

## 2023-01-16 NOTE — Consult Note (Signed)
Hospital Consult    Reason for Consult:  Left leg wound Referring Physician:  Orthopedic surgery MRN #:  [id number]  History of Present Illness: This is a 82 y.o. female with multiple comorbidities including COPD, diabetes, A-fib on Xarelto, CHF, CKD that vascular surgery has been consulted for left lower extremity wound.  Patient states that this has been ongoing for the last month.  She states she lives independently and is ambulatory.  She does have an appointment to see [place] in the office on 3/28 of this month.  States she has no pain in her feet.  No pain when walking.  No previous vascular interventions.  Past Medical History:  Diagnosis Date   Arthritis    Asthma    Cardiomyopathy ([facility])    COPD (chronic obstructive pulmonary disease) (HCC)    Diabetes mellitus    Glaucoma    High cholesterol    Hypertension    Systolic dysfunction, left ventricle     Past Surgical History:  Procedure Laterality Date   A-FLUTTER ABLATION N/A 12/09/2019   Procedure: A-FLUTTER ABLATION;  Surgeon: Evans Lance, MD;  Location: [place] [facility];  Service: Cardiovascular;  Laterality: N/A;   ABDOMINAL HYSTERECTOMY     AV NODE ABLATION N/A 12/14/2019   Procedure: AV NODE ABLATION;  Surgeon: Evans Lance, MD;  Location: [place] [facility];  Service: Cardiovascular;  Laterality: N/A;   HIP SURGERY     JOINT REPLACEMENT     left lumpectomy     PACEMAKER IMPLANT N/A 12/14/2019   Procedure: PACEMAKER IMPLANT;  Surgeon: Evans Lance, MD;  Location: [facility];  Service: Cardiovascular;  Laterality: N/A;   REPLACEMENT TOTAL KNEE BILATERAL     2009-Right, 2001-Left   Right bog toe fusion     Right trigger finger surgery      No Known Allergies  Prior to Admission medications   Medication Sig Start Date End Date Taking? Authorizing Provider  amiodarone (PACERONE) 200 MG tablet Take one tablet by mouth Monday through Saturday. DO NOT TAKE on SUNDAY. 12/07/22  Yes Evans Lance, MD   atorvastatin (LIPITOR) 10 MG tablet TAKE 1 TABLET BY MOUTH EVERY DAY AT 6PM 06/01/19  Yes Sherran Needs, NP  doxycycline (DORYX) 100 MG EC tablet Take 1 tablet (100 mg total) by mouth 2 (two) times daily. 01/10/23  Yes Conte, Tessa N, PA-C  JANUVIA 25 MG tablet Take 25 mg by mouth daily. 01/15/23  Yes [provider]  LEVEMIR FLEXTOUCH 100 UNIT/ML FlexPen Inject 11 Units into the skin at bedtime. 03/29/21  Yes [provider]  levothyroxine (SYNTHROID) 88 MCG tablet Take 88 mcg by mouth every morning. 12/31/20  Yes [provider]  metoprolol succinate (TOPROL-XL) 25 MG 24 hr tablet Take 25 mg by mouth daily.   Yes [provider]  Rivaroxaban (XARELTO) 15 MG TABS tablet TAKE 1 TABLET BY MOUTH EVERY DAY WITH DINNER 12/07/22  Yes Evans Lance, MD  torsemide (DEMADEX) 20 MG tablet Take 1 tablet (20 mg total) by mouth 2 (two) times daily. 12/07/22  Yes Evans Lance, MD  ACCU-CHEK GUIDE test strip USE TO [place] BLOOD SUGAR TWICE DAILY 01/03/21   [provider]  Accu-Chek Softclix Lancets lancets 2 (two) times daily. 01/03/21   [provider]  BD PEN NEEDLE NANO 2ND GEN 32G X 4 MM MISC USE ONCE A DAY WITH [place] KWIK PEN 04/08/21   [provider]  Blood Glucose Monitoring Suppl (ACCU-CHEK GUIDE) w/Device KIT USE TO CHECK BLOOD SUGAR 2 TIMES A DAY 01/03/21   [provider]  losartan (COZAAR) 25 MG tablet Take 1 tablet by mouth daily. Patient not taking: Reported on 01/16/2023    [provider]    Social History   Socioeconomic History   Marital status: Single    Spouse name: Not on file   Number of children: Not on file   Years of education: Not on file   Highest education level: Not on file  Occupational History   Not on file  Tobacco Use   Smoking status: Never   Smokeless tobacco: Never  Substance and Sexual Activity   Alcohol use: No   Drug use: No   Sexual activity: Never  Other Topics Concern    Not on file  Social History Narrative   Not on file   Social Determinants of Health   Financial Resource Strain: Not on file  Food Insecurity: No Food Insecurity (01/16/2023)   Hunger Vital Sign    Worried About Running Out of Food in the Last Year: Never true    Ran Out of Food in the Last Year: Never true  Transportation Needs: No Transportation Needs (01/16/2023)   PRAPARE - Hydrologist (Medical): No    Lack of Transportation (Non-Medical): No  Physical Activity: Not on file  Stress: Not on file  Social Connections: Not on file  Intimate Partner Violence: Not At Risk (01/16/2023)   Humiliation, Afraid, Rape, and Kick questionnaire    Fear of Current or Ex-Partner: No    Emotionally Abused: No    Physically Abused: No    Sexually Abused: No     Family History  Problem Relation Age of Onset   Diabetes Brother     ROS: '[x]'$  Positive   '[ ]'$  Negative   '[ ]'$  All sytems reviewed and are negative  Cardiovascular: '[]'$  chest pain/pressure '[]'$  palpitations '[]'$  SOB lying flat '[]'$  DOE '[]'$  pain in legs while walking '[]'$  pain in legs at rest '[]'$  pain in legs at night '[]'$  non-healing ulcers '[]'$  hx of DVT '[x]'$  swelling in legs  Pulmonary: '[]'$  productive cough '[]'$  asthma/wheezing '[]'$  home O2  Neurologic: '[]'$  weakness in '[]'$  arms '[]'$  legs '[]'$  numbness in '[]'$  arms '[]'$  legs '[]'$  hx of CVA '[]'$  mini stroke '[]'$ difficulty speaking or slurred speech '[]'$  temporary loss of vision in one eye '[]'$  dizziness  Hematologic: '[]'$  hx of cancer '[]'$  bleeding problems '[]'$  problems with blood clotting easily  Endocrine:   '[]'$  diabetes '[]'$  thyroid disease  GI '[]'$  vomiting blood '[]'$  blood in stool  GU: '[]'$  CKD/renal failure '[]'$  HD--'[]'$  M/W/F or '[]'$  T/T/S '[]'$  burning with urination '[]'$  blood in urine  Psychiatric: '[]'$  anxiety '[]'$  depression  Musculoskeletal: '[]'$  arthritis '[]'$  joint pain  Integumentary: '[]'$  rashes '[]'$  ulcers  Constitutional: '[]'$  fever '[]'$  chills   Physical  Examination  Vitals:   01/16/23 1804 01/16/23 2031  BP:  (!) 186/138  Pulse:  85  Resp: (!) 30 19  Temp:  97.9 F (36.6 C)  SpO2:  99%   Body mass index is 32.32 kg/m.  General:  NAD Gait: Not observed HENT: WNL, normocephalic Pulmonary: normal non-labored breathing Cardiac: regular, without  Murmurs, rubs or gallops Abdomen:  soft, NT/ND Vascular Exam/Pulses: Bilateral femoral pulses palpable Left DP palpable Wound on the posterior left calf as pictured Thickened skin with hyperkeratosis bilateral lower extremities  Extremities: without ischemic  changes Musculoskeletal: no muscle wasting or atrophy  Neurologic: A&O X 3; Appropriate Affect ; SENSATION: normal; MOTOR FUNCTION:  moving all extremities equally. Speech is fluent/normal         CBC    Component Value Date/Time   WBC 9.2 01/16/2023 0047   RBC 3.66 (L) 01/16/2023 0047   HGB 10.8 (L) 01/16/2023 0047   HCT 34.3 (L) 01/16/2023 0047   PLT 216 01/16/2023 0047   MCV 93.7 01/16/2023 0047   MCH 29.5 01/16/2023 0047   MCHC 31.5 01/16/2023 0047   RDW 13.4 01/16/2023 0047   LYMPHSABS 1.0 01/15/2023 1055   MONOABS 0.7 01/15/2023 1055   EOSABS 0.1 01/15/2023 1055   BASOSABS 0.1 01/15/2023 1055    BMET    Component Value Date/Time   NA 136 01/16/2023 0047   NA 136 05/14/2022 1052   K 3.8 01/16/2023 0047   CL 101 01/16/2023 0047   CO2 26 01/16/2023 0047   GLUCOSE 174 (H) 01/16/2023 0047   BUN 27 (H) 01/16/2023 0047   BUN 35 (H) 05/14/2022 1052   CREATININE 2.29 (H) 01/16/2023 0047   CALCIUM 8.6 (L) 01/16/2023 0047   GFRNONAA 21 (L) 01/16/2023 0047   GFRAA 26 (L) 08/02/2020 0947    COAGS: Lab Results  Component Value Date   INR 1.6 (H) 12/08/2019   INR 1.8 (H) 12/08/2019   INR 1.16 07/01/2017     Non-Invasive Vascular Imaging:    N/A   ASSESSMENT/PLAN: This is a 82 y.o. female with multiple comorbidities including COPD, diabetes, A-fib on Xarelto, CHF, CKD that vascular surgery has been  consulted for left lower extremity wound.  She has an easily palpable left dorsalis pedis pulse.  Her previous ABIs have been normal.  No signs of significant arterial insufficiency.  I do not think she needs an arteriogram and this is not an ischemic ulcer.  She does have marked bilateral lower extremity swelling and unclear whether this could be related to chronic venous insufficiency versus lymphedema.  She has an appointment with [place] on 3/28 for venous reflux study and further workup.  Would be appropriate to get wound care to see her and place her in a [place] boot to the left lower extremity.  I discussed elevation and compression.  She can be referred to the outpatient wound center for [facility] boot changes as we do not have the capacity to do this in our office.  Defer to Ortho if they feel she needs long-term antibiotics given their notes suggest possible osteomyelitis.  Patient unable to get MRI.  I do not think she needs a primary amputation at this time.  Marty Heck, MD [facility] Office: [place]

## 2023-01-16 NOTE — Progress Notes (Signed)
Patient attempting to hit staff. MD aware. See new orders.

## 2023-01-16 NOTE — Progress Notes (Signed)
   01/16/23 1529  Charting Type  Charting Type Full Reassessment Changes Noted  Focused Reassessment No Changes Respiratory;Psychosocial  Neurological  Orientation Level Oriented to person;Disoriented to place;Disoriented to time;Disoriented to situation  Respiratory  Respiratory (WDL) X  Bilateral Breath Sounds Diminished  Respiratory Pattern Unlabored;Symmetrical  Chest Assessment Chest expansion symmetrical  ECG Monitoring  ECG Heart Rate 87  Neurological  Level of Consciousness Alert     Patient called out complaining of SOB, requesting to leave the room. Patient seemed to have situational confusion, requesting to speak with family.   Primary RN and NT to assist patient safely into bed.   [place] with 3l applied, patient reports improved SOB. Denies any pain. Bed alarm on, cal bell within reach.

## 2023-01-16 NOTE — Progress Notes (Signed)
Attempted to call family multiple times without success.

## 2023-01-16 NOTE — Progress Notes (Addendum)
Patient continues hitting staff, spiting and biting. See PRN orders.

## 2023-01-16 NOTE — Progress Notes (Signed)
Wound care performed per order.  

## 2023-01-16 NOTE — Progress Notes (Signed)
Patient has increased confusion and is impulsive attempting to leave the bed.

## 2023-01-16 NOTE — Progress Notes (Addendum)
PROGRESS NOTE    Lydia Myers  [id number] DOB: 24-Nov-1940 DOA: 01/15/2023 PCP: Collene Leyden, MD    Chief Complaint  Patient presents with   Leg Swelling    Brief Narrative:  Patient is a 82 year old female history of arthritis, COPD/asthma, type 2 diabetes, paroxysmal A-fib on Xarelto amiodarone CHF, CHB status post PPM, CKD stage IIIb-4, hypertension hypothyroidism chronic venous stasis changes, glaucoma presented to the ED at the urging of family due to worsening leg wound on the left lower extremity with malodorous.  It is noted that per chart caregiver stated wound has been progressive x 3 weeks.  Patient admitted, plain films done with concerns for possible osteomyelitis however patient unable to get MRI due to PPM.  Orthopedics consulted who recommended vascular surgery evaluation.   Assessment & Plan:   Principal Problem:   Osteomyelitis ([place]) Active Problems:   Paroxysmal atrial fibrillation (HCC)   Hypertension   Type 2 diabetes mellitus with stage 3b chronic kidney disease, with long-term current use of insulin (HCC)   Chronic diastolic CHF (congestive heart failure) ([place])   #1 left calcaneal bone/concern for osteomyelitis of the left calcaneal tuberosity -Patient presented with a 3-week history of worsening left lower extremity wound malodorous. -Plain films done concerning for possible osteomyelitis recommended MRI however MRI unable to be done due to history of PPM. -CRP noted at 0.6.  Lactic acid at 1.2. -Continue empiric IV vancomycin, IV cefepime. -Patient seen in consultation by orthopedics, who are recommending continuation of IV antibiotics and evaluation by vascular surgery for further recommendations. -Continue IV antibiotics, supportive care.  2.  COPD/asthma -Stable. -Continue nebs as needed.  3.  Paroxysmal atrial fibrillation -Continue amiodarone and Toprol-XL for rate control. -Continue to hold Xarelto in anticipation of possible  procedure. -Place on DVT prophylaxis with heparin.  4.  Diabetes mellitus type 2 -Hemoglobin A1c 6.7 (12/09/2019) -CBG noted at 149 this morning. -Check a hemoglobin A1c. -Hold home regimen of long-acting insulin of Levemir. -SSI.  5.  Hypothyroidism -Continue home regimen Synthroid.  6.  Chronic diastolic CHF -Currently euvolemic. -Continue home regimen metoprolol succinate, torsemide, Lipitor.  7.  CHB status post PPM -Stable.  8.  CKD stage IIIb-IV -Currently stable at baseline. -Continue home regimen metoprolol succinate, torsemide. -Outpatient follow-up with nephrology.  9.  Glaucoma -Resume home regimen eyedrops.  10.  Anxiety -Xanax as needed.  11.??  Sundowning -Patient with some agitation, irritability per RN. -Zyprexa x 1.    DVT prophylaxis: Heparin Code Status: Full Family Communication: Updated patient.  No family at bedside. Disposition: TBD  Status is: Inpatient Remains inpatient appropriate because: Severity of illness   Consultants:  Orthopedics: 01/16/2023 Wound care RN 01/16/2023  Procedures:  Chest x-ray 01/15/2023   Antimicrobials:  IV cefepime 01/15/2023>>>>> IV Zosyn 312 2024 x 1 dose IV vancomycin 01/15/2023>>>>>   Subjective: Patient sitting up in bed.  Denies any chest pain.  No shortness of breath.  No abdominal pain.  States she saw orthopedics.  Objective: Vitals:   01/16/23 0610 01/16/23 0825 01/16/23 1529 01/16/23 1530  BP: 130/76 135/82  (!) 175/71  Pulse: 68 69  72  Resp: 18 16  (!) 24  Temp: (!) 97.4 F (36.3 C) 98.3 F (36.8 C)  98 F (36.7 C)  TempSrc: Oral Oral  Oral  SpO2: 100% 90% 94% 99%  Weight: 85.4 kg     Height:        Intake/Output Summary (Last 24 hours) at 01/16/2023 1725 Last data filed  at 01/16/2023 1442 Gross per 24 hour  Intake 500 ml  Output 2300 ml  Net -1800 ml   Filed Weights   01/15/23 2123 01/16/23 0610  Weight: 86.3 kg 85.4 kg    Examination:  General exam: Appears calm and  comfortable  Respiratory system: Clear to auscultation.  No wheezes, no crackles, no rhonchi.  Fair air movement.  Speaking in full sentences.  Respiratory effort normal. Cardiovascular system: S1 & S2 heard, RRR. No JVD, murmurs, rubs, gallops or clicks. No pedal edema. Gastrointestinal system: Abdomen is nondistended, soft and nontender. No organomegaly or masses felt. Normal bowel sounds heard. Central nervous system: Alert and oriented. No focal neurological deficits. Extremities: Bilateral chronic venous stasis changes noted.  Left lower extremity with bandage and dressing in place.   Skin: No rashes, lesions or ulcers Psychiatry: Judgement and insight appear normal. Mood & affect appropriate.     Data Reviewed: I have personally reviewed following labs and imaging studies  CBC: Recent Labs  Lab 01/15/23 1055 01/16/23 0047  WBC 7.8 9.2  NEUTROABS 5.9  --   HGB 11.3* 10.8*  HCT 36.8 34.3*  MCV 94.8 93.7  PLT 192 [id number]    Basic Metabolic Panel: Recent Labs  Lab 01/15/23 1055 01/16/23 0047  NA 137 136  K 3.7 3.8  CL 102 101  CO2 24 26  GLUCOSE 154* 174*  BUN 29* 27*  CREATININE 2.20* 2.29*  CALCIUM 8.7* 8.6*    GFR: Estimated Creatinine Clearance: 19.7 mL/min (A) (by C-G formula based on SCr of 2.29 mg/dL (H)).  Liver Function Tests: Recent Labs  Lab 01/15/23 1055 01/16/23 0047  AST 18 16  ALT 10 11  ALKPHOS 73 63  BILITOT 0.7 0.8  PROT 7.3 6.5  ALBUMIN 3.9 3.8    CBG: Recent Labs  Lab 01/15/23 2124 01/16/23 0824 01/16/23 1148 01/16/23 1650  GLUCAP 181* 149* 151* 148*     Recent Results (from the past 240 hour(s))  Blood Cultures x 2 sites     Status: None (Preliminary result)   Collection Time: 01/15/23  1:38 PM   Specimen: BLOOD LEFT FOREARM  Result Value Ref Range Status   Specimen Description   Final    BLOOD LEFT FOREARM Performed at [facility], [place] [address] [place], [place] [address]    Special Requests   Final    BOTTLES  DRAWN AEROBIC AND ANAEROBIC Blood Culture results may not be optimal due to an inadequate volume of blood received in culture bottles Performed at [facility] [address] [place], [place] [address]    Culture   Final    NO GROWTH < 24 HOURS Performed at [facility] [address] [place], [place] [address]    Report Status PENDING  Incomplete         Radiology Studies: DG Chest 2 View  Result Date: 01/15/2023 CLINICAL DATA:  Lower extremity edema EXAM: CHEST - 2 VIEW COMPARISON:  08/02/2020 FINDINGS: Chronic cardiomegaly. Dual lead pacemaker. Possible pulmonary venous hypertension but no frank edema. No visible effusion. Mild chronic scarring at the left lung base. IMPRESSION: Chronic cardiomegaly. Dual lead pacemaker. Possible pulmonary venous hypertension but no frank edema. Electronically Signed   By: Nelson Chimes M.D.   On: 01/15/2023 14:08   DG Foot Complete Left  Result Date: 01/15/2023 CLINICAL DATA:  Wound cellulitis back of left leg. EXAM: LEFT FOOT - COMPLETE 3+ VIEW COMPARISON:  Skip of cellulitis. FINDINGS: Bones  are diffusely demineralized which limits assessment. Within this limitation, there is no evidence for an acute fracture or dislocation. No worrisome lytic or sclerotic osseous abnormality. Skin irregularity posterior ankle region may be related to the patient's described wound. There is some cortical irregularity of the calcaneal tuberosity underlying this region. IMPRESSION: 1. Skin irregularity posterior ankle region may be related to the patient's described wound. There is some cortical irregularity of the calcaneal tuberosity underlying this region. Osteomyelitis a concern. MRI with and without contrast could be used to further evaluate as clinically warranted. Electronically Signed   By: Misty Stanley M.D.   On: 01/15/2023 11:45        Scheduled Meds:  amiodarone  200 mg Oral Once per day on Mon Tue Wed Thu Fri Sat    atorvastatin  10 mg Oral Daily   heparin injection (subcutaneous)  5,000 Units Subcutaneous Q8H   insulin aspart  0-9 Units Subcutaneous TID WC   leptospermum manuka honey  1 Application Topical Daily   levothyroxine  88 mcg Oral q morning   metoprolol succinate  25 mg Oral Daily   OLANZapine zydis  5 mg Oral Once   mouth rinse  15 mL Mouth Rinse 4 times per day   torsemide  20 mg Oral BID   Continuous Infusions:  ceFEPime (MAXIPIME) IV 2 g (01/15/23 2256)   [START ON 01/17/2023] vancomycin       LOS: 1 day    Time spent: 35 minutes    Irine Seal, MD [facility]   To contact the attending provider between 7A-7P or the covering provider during after hours 7P-7A, please log into the web site [email or web address] and access using universal [place] password for that web site. If you do not have the password, please call the hospital operator.  01/16/2023, 5:25 PM

## 2023-01-17 DIAGNOSIS — J449 Chronic obstructive pulmonary disease, unspecified: Secondary | ICD-10-CM | POA: Diagnosis not present

## 2023-01-17 DIAGNOSIS — M869 Osteomyelitis, unspecified: Secondary | ICD-10-CM | POA: Diagnosis not present

## 2023-01-17 DIAGNOSIS — E039 Hypothyroidism, unspecified: Secondary | ICD-10-CM | POA: Diagnosis not present

## 2023-01-17 DIAGNOSIS — I480 Paroxysmal atrial fibrillation: Secondary | ICD-10-CM | POA: Diagnosis not present

## 2023-01-17 LAB — CBC WITH DIFFERENTIAL/PLATELET
Abs Immature Granulocytes: 0.09 10*3/uL — ABNORMAL HIGH (ref 0.00–0.07)
Basophils Absolute: 0.1 10*3/uL (ref 0.0–0.1)
Basophils Relative: 1 %
Eosinophils Absolute: 0.1 10*3/uL (ref 0.0–0.5)
Eosinophils Relative: 1 %
HCT: 43 % (ref 36.0–46.0)
Hemoglobin: 13.2 g/dL (ref 12.0–15.0)
Immature Granulocytes: 1 %
Lymphocytes Relative: 9 %
Lymphs Abs: 1.1 10*3/uL (ref 0.7–4.0)
MCH: 29.1 pg (ref 26.0–34.0)
MCHC: 30.7 g/dL (ref 30.0–36.0)
MCV: 94.7 fL (ref 80.0–100.0)
Monocytes Absolute: 1.3 10*3/uL — ABNORMAL HIGH (ref 0.1–1.0)
Monocytes Relative: 10 %
Neutro Abs: 10 10*3/uL — ABNORMAL HIGH (ref 1.7–7.7)
Neutrophils Relative %: 78 %
Platelets: 232 10*3/uL (ref 150–400)
RBC: 4.54 MIL/uL (ref 3.87–5.11)
RDW: 13.3 % (ref 11.5–15.5)
WBC: 12.6 10*3/uL — ABNORMAL HIGH (ref 4.0–10.5)
nRBC: 0 % (ref 0.0–0.2)

## 2023-01-17 LAB — RENAL FUNCTION PANEL
Albumin: 4 g/dL (ref 3.5–5.0)
Anion gap: 18 — ABNORMAL HIGH (ref 5–15)
BUN: 27 mg/dL — ABNORMAL HIGH (ref 8–23)
CO2: 27 mmol/L (ref 22–32)
Calcium: 9.3 mg/dL (ref 8.9–10.3)
Chloride: 95 mmol/L — ABNORMAL LOW (ref 98–111)
Creatinine, Ser: 2.02 mg/dL — ABNORMAL HIGH (ref 0.44–1.00)
GFR, Estimated: 24 mL/min — ABNORMAL LOW (ref >60)
Glucose, Bld: 157 mg/dL — ABNORMAL HIGH (ref 70–99)
Phosphorus: 4 mg/dL (ref 2.5–4.6)
Potassium: 4.2 mmol/L (ref 3.5–5.1)
Sodium: 140 mmol/L (ref 135–145)

## 2023-01-17 LAB — GLUCOSE, CAPILLARY
Glucose-Capillary: 166 mg/dL — ABNORMAL HIGH (ref 70–99)
Glucose-Capillary: 154 mg/dL — ABNORMAL HIGH (ref 70–99)
Glucose-Capillary: 188 mg/dL — ABNORMAL HIGH (ref 70–99)
Glucose-Capillary: 194 mg/dL — ABNORMAL HIGH (ref 70–99)
Glucose-Capillary: 133 mg/dL — ABNORMAL HIGH (ref 70–99)

## 2023-01-17 MED ORDER — OLANZAPINE 10 MG IM SOLR
2.5000 mg | Freq: Once | INTRAMUSCULAR | Status: DC
  Filled 2023-01-17: qty 10

## 2023-01-17 MED ORDER — RIVAROXABAN 15 MG PO TABS
15.0000 mg | ORAL_TABLET | Freq: Every day | ORAL | Status: DC
  Administered 2023-01-17 – 2023-01-23 (×7): 15 mg via ORAL
  Filled 2023-01-17 (×7): qty 1

## 2023-01-17 MED ORDER — OLANZAPINE 5 MG PO TBDP
5.0000 mg | ORAL_TABLET | Freq: Two times a day (BID) | ORAL | Status: DC
  Administered 2023-01-17 – 2023-01-23 (×13): 5 mg via ORAL
  Filled 2023-01-17 (×14): qty 1

## 2023-01-17 MED ORDER — SACCHAROMYCES BOULARDII 250 MG PO CAPS
250.0000 mg | ORAL_CAPSULE | Freq: Two times a day (BID) | ORAL | Status: DC
  Administered 2023-01-17 – 2023-01-23 (×12): 250 mg via ORAL
  Filled 2023-01-17 (×12): qty 1

## 2023-01-17 NOTE — Progress Notes (Signed)
Orthopedic Tech Progress Note Patient Details:  Lydia Myers 1941-03-04 [id number]  Patient ID: Dianna Limbo Naeem, female   DOB: [birth year], 82 y.o.   MRN: [id number]  Kennis Carina 01/17/2023, 12:08 PM Left unna boot applied

## 2023-01-17 NOTE — Progress Notes (Signed)
     Lydia Myers is a 82 y.o. female   Orthopaedic diagnosis: Left posterior leg wound   Subjective: Patient seen and examined this morning.  She denies feeling ill.  She denies any fever, chills, or night sweats.  She says she really has minimal pain in the lower extremity this morning. She has seen vascular surgery who recommended Unna boot and wound care.  She has an outpatient follow-up scheduled on 3/28 for further vascular workup.  Recall, the patient is unable to get an MRI scan due to implanted pacemaker.  She did have subtle lucency about the calcaneal tuberosity suspicious for osteomyelitis by radiographs in the ED.  White blood cell count did elevate to 12.6 today but again she feels well is afebrile    Objectyive: Vitals:   01/17/23 0515 01/17/23 0910  BP: (!) 143/88 121/78  Pulse: 75   Resp: 17 20  Temp: 97.8 F (36.6 C) 98.2 F (36.8 C)  SpO2: 98% 92%     Exam: Awake and alert Respirations even and unlabored No acute distress  Examination of the bilateral lower extremities demonstrates diffuse chronic skin changes from the proximal tibia and extends distally to the feet. There is thickening of the skin. There is diffuse bilateral lower extremity swelling. On the left, there is posterior wound about the lower leg with mild drainage and malodor. She has tenderness to palpation. Wound does not appear to probe deeply. There is no obvious exposed subcutaneous tissue or bone. She is able to plantarflex the ankles, but with some pain on the left. She is able to wiggle the toes. She is able to gently flex and extend the knee. She endorses intact sensation to light touch. She does have palpable pulses in both feet.   Assessment: Left posterior leg wound with possible septic tenosynovitis and osteomyelitis provided findings the calcaneus by radiographs Chronic bilateral venous stasis  Plan: Lengthy discussion once again today at bedside.  Fortunately she has minimal pain and  does not appear systemically ill.  We discussed the potential for deep infection and osteomyelitis.  This is obviously difficult to evaluate as she is unable to obtain an MRI scan.  She has a plan in place for further vascular follow-up and workup outpatient.  There is no indication for orthopedic intervention currently.  She understands that is she develops worsening symptoms of deep infection this may necessitate surgery in the form of amputation. She voiced understanding.  Provided her findings on radiographs and elevated white blood cell count would agree with antibiotics.  Will defer to primary team with regard to agent. She may amenable to outpatient follow up with PCP or wound care for further management/indiciations for antibiotics as again she did not appear systemically ill at this time.  Orthopedics will sign off.  Nathon Stefanski J. [place], PA-C

## 2023-01-17 NOTE — Consult Note (Addendum)
WOC Nurse re-consult Note: Refer to previous consult note on 3/13 for assessment and measurements.  Vascular team performed a consult last night and has recommended an [place] boot be applied for topical treatment to left leg.  I d/ced the previous [facility] orders and placed an order for ortho tech and paged them  to apply Una boot to left leg.  Pt will need a referral to the outpatient wound care clinic as recommended by vascular after discharge, as well as home health assistance to change the [facility] boot twice a week. Please re-consult if further assistance is needed.  Thank-you,  Julien Girt MSN, [place], [place], [place], [place]

## 2023-01-17 NOTE — Evaluation (Signed)
Occupational Therapy Evaluation Patient Details Name: Lydia Myers MRN: [id number] DOB: [birth year] Today's Date: 01/17/2023   History of Present Illness Patient is a 82 year old female history of arthritis, COPD/asthma, type 2 diabetes, paroxysmal A-fib on Xarelto amiodarone CHF, CHB status post PPM, CKD stage IIIb-4, hypertension hypothyroidism chronic venous stasis changes, glaucoma presented to the ED at the urging of family due to worsening leg wound on the left lower extremity   Clinical Impression   Lydia Myers is an 82 year old woman who presents with pain in lower extremity. On evaluation she is alert to self and place but not to month, year or date. She covers stating "my mind is mixed up in here." She knows her birth date, her age and her address. She states she lives alone, is independent and she drives. Unsure of her baseline function or how much assist she has from family. She is not oriented to time and per nursing sun-downed last night. She is able to get herself out of bed, stand and transfer to the chair. She needs assist for LB ADLs - but this may be chronic due to her lower extremity wounds. She talks about Lorra Hals on the television. When asked "who can help you" at home she states "I don't need any help, I can do it on my own." Therapist unsure of appropriate disposition until family assistance is ascertained. Physically - I think she may be near her baseline and would function better in her home. Therapist does encourage [place] services in order to ascertain patient's safety at  home and ability to manage medical and financial matters.     Recommendations for follow up therapy are one component of a multi-disciplinary discharge planning process, led by the attending physician.  Recommendations may be updated based on patient status, additional functional criteria and insurance authorization.   Follow Up Recommendations  Other (comment) ([place] vs SNF)     Assistance  Recommended at Discharge Intermittent Supervision/Assistance  Patient can return home with the following A lot of help with bathing/dressing/bathroom;Assistance with cooking/housework;Direct supervision/assist for medications management;Assist for transportation;Direct supervision/assist for financial management    Functional Status Assessment  Patient has had a recent decline in their functional status and demonstrates the ability to make significant improvements in function in a reasonable and predictable amount of time.  Equipment Recommendations  None recommended by OT    Recommendations for Other Services       Precautions / Restrictions Precautions Precautions: Fall Restrictions Weight Bearing Restrictions: No      Mobility Bed Mobility Overal bed mobility: Needs Assistance Bed Mobility: Supine to Sit           General bed mobility comments: Min assist for LLE    Transfers Overall transfer level: Needs assistance Equipment used: Rolling walker (2 wheels) Transfers: Sit to/from Stand, Bed to chair/wheelchair/BSC Sit to Stand: Min guard     Step pivot transfers: Min guard            Balance Overall balance assessment: Mild deficits observed, not formally tested                                         ADL either performed or assessed with clinical judgement   ADL Overall ADL's : Needs assistance/impaired Eating/Feeding: Set up;Sitting   Grooming: Set up;Sitting   Upper Body Bathing: Set up;Sitting   Lower Body Bathing:  Moderate assistance;Sit to/from stand   Upper Body Dressing : Supervision/safety;Sitting   Lower Body Dressing: Sit to/from stand;Maximal assistance   Toilet Transfer: Min guard;Rolling walker (2 wheels);BSC/3in1   Toileting- Clothing Manipulation and Hygiene: Minimal assistance;Sit to/from stand       Functional mobility during ADLs: Min guard;Rolling walker (2 wheels)       Vision Patient Visual Report: No  change from baseline       Perception     Praxis      Pertinent Vitals/Pain Pain Assessment Pain Assessment: Faces Faces Pain Scale: Hurts little more Pain Location: Left posterior leg Pain Intervention(s): Limited activity within patient's tolerance     Hand Dominance Right   Extremity/Trunk Assessment Upper Extremity Assessment Upper Extremity Assessment: Overall WFL for tasks assessed   Lower Extremity Assessment Lower Extremity Assessment: Defer to PT evaluation   Cervical / Trunk Assessment Cervical / Trunk Assessment: Kyphotic   Communication Communication Communication: No difficulties   Cognition Arousal/Alertness: Awake/alert Behavior During Therapy: WFL for tasks assessed/performed Overall Cognitive Status: Within Functional Limits for tasks assessed                                       General Comments       Exercises     Shoulder Instructions      Home Living Family/patient expects to be discharged to:: Private residence Living Arrangements: Alone Available Help at Discharge: Family;Available PRN/intermittently Type of Home: Apartment Nurse, children's)                                  Prior Functioning/Environment                          OT Problem List: Decreased activity tolerance;Pain;Decreased safety awareness;Decreased cognition      OT Treatment/Interventions: Self-care/ADL training;DME and/or AE instruction;Therapeutic activities;Balance training;Patient/family education    OT Goals(Current goals can be found in the care plan section) Acute Rehab OT Goals Patient Stated Goal: to go home OT Goal Formulation: With patient Time For Goal Achievement: 01/31/23 Potential to Achieve Goals: Good  OT Frequency: Min 2X/week    Co-evaluation              AM-PAC OT "6 Clicks" Daily Activity     Outcome Measure Help from another person eating meals?: A Little Help from another person  taking care of personal grooming?: A Little Help from another person toileting, which includes using toliet, bedpan, or urinal?: A Little Help from another person bathing (including washing, rinsing, drying)?: A Lot Help from another person to put on and taking off regular upper body clothing?: A Little Help from another person to put on and taking off regular lower body clothing?: A Lot 6 Click Score: 16   End of Session Equipment Utilized During Treatment: Rolling walker (2 wheels) Nurse Communication: Mobility status  Activity Tolerance: Patient tolerated treatment well Patient left: in chair;with call bell/phone within reach;with chair alarm set  OT Visit Diagnosis: Pain                Time: [id number] OT Time Calculation (min): 18 min Charges:  OT General Charges $OT Visit: 1 Visit OT Evaluation $OT Eval Low Complexity: 1 Low  Gustavo Lah, OTR/L [place]  Office [phone]  Jevonte Clanton L Kaydyn Sayas 01/17/2023, 4:12 PM

## 2023-01-17 NOTE — Progress Notes (Signed)
PROGRESS NOTE    Lydia Myers  [id number] DOB: 05-30-1941 DOA: 01/15/2023 PCP: Collene Leyden, MD    Chief Complaint  Patient presents with   Leg Swelling    Brief Narrative:  Patient is a 82 year old female history of arthritis, COPD/asthma, type 2 diabetes, paroxysmal A-fib on Xarelto amiodarone CHF, CHB status post PPM, CKD stage IIIb-4, hypertension hypothyroidism chronic venous stasis changes, glaucoma presented to the ED at the urging of family due to worsening leg wound on the left lower extremity with malodorous.  It is noted that per chart caregiver stated wound has been progressive x 3 weeks.  Patient admitted, plain films done with concerns for possible osteomyelitis however patient unable to get MRI due to PPM.  Orthopedics consulted who recommended vascular surgery evaluation.   Assessment & Plan:   Principal Problem:   Osteomyelitis ([place]) Active Problems:   Paroxysmal atrial fibrillation (HCC)   Hypertension   Type 2 diabetes mellitus with stage 3b chronic kidney disease, with long-term current use of insulin (HCC)   Chronic diastolic CHF (congestive heart failure) ([place])   #1 left calcaneal bone/concern for osteomyelitis of the left calcaneal tuberosity -Patient presented with a 3-week history of worsening left lower extremity wound malodorous. -Plain films done concerning for possible osteomyelitis recommended MRI however MRI unable to be done due to history of PPM. -CRP noted at 0.6.  Lactic acid at 1.2. -Continue empiric IV vancomycin, IV cefepime. -Patient seen in consultation by orthopedics, who are recommending continuation of IV antibiotics and evaluation by vascular surgery who notes that patient had a previous ABI that was normal, patient noted to have a palpable left dorsalis pedis pulse, no signs of significant arterial insufficiency do not feel patient needs an arteriogram and does not feel this is an ischemic ulcer.   -Per vascular surgery may be  related to chronic venous insufficiency versus lymphedema.   -Vascular surgery notes patient has an appointment on 01/31/2023 with vascular surgery for venous reflux study and further workup and recommending wound care evaluate patient to place Unna boot to the left lower extremity with referral to outpatient wound care center for Unna boot changes.   -Per orthopedics and vascular surgery.   2.  COPD/asthma -Stable. -Continue nebs as needed.  3.  Paroxysmal atrial fibrillation -Continue amiodarone and Toprol-XL for rate control. -Xarelto initially held due to concern for possible procedure however no procedure planned at this time per vascular or per orthopedic surgery and as such we will resume home regimen Xarelto.   4.  Diabetes mellitus type 2 -Hemoglobin A1c 6.7 (12/09/2019) -CBG noted at 154 this morning. -Hemoglobin A1c 8.8 (01/16/2023 ). -Continue to hold home regimen long-acting Levemir.   -SSI.    5.  Hypothyroidism -Synthroid.  6.  Chronic diastolic CHF -Euvolemic.   -Continue metoprolol succinate, torsemide, Lipitor.   7.  CHB status post PPM -Stable.  8.  CKD stage IIIb-IV -Currently stable at baseline. -Continue home regimen metoprolol succinate, torsemide. -Outpatient follow-up with nephrology.  9.  Glaucoma -Resume home regimen eyedrops.  10.  Anxiety -Xanax as needed.  11.??  Sundowning -Patient with some agitation, irritability per RN overnight had to require some IV Haldol. -Place on Zyprexa 5 mg p.o. twice daily. -Restraints discontinued this morning per RN request, if needed soft restraints may be resumed.    DVT prophylaxis: Xarelto. Code Status: Full Family Communication: Updated patient.  No family at bedside. Disposition: TBD  Status is: Inpatient Remains inpatient appropriate because: Severity of illness  Consultants:  Orthopedics: 01/16/2023 Wound care RN 01/16/2023 Vascular surgery: Dr. Carlis Abbott 01/16/2023  Procedures:  Chest x-ray  01/15/2023   Antimicrobials:  IV cefepime 01/15/2023>>>>> IV Zosyn 312 2024 x 1 dose IV vancomycin 01/15/2023>>>>>   Subjective: Events overnight noted.  Patient noted to have some agitation and what sounds like sundowning.  Patient alert oriented this morning, improved clinically, no agitation or combativeness, seems she is at her baseline.  States a gentleman came and placed on a boot on her left lower extremity this morning.  Denies any significant pain.  Objective: Vitals:   01/16/23 2322 01/17/23 0500 01/17/23 0515 01/17/23 0910  BP: (!) 149/103  (!) 143/88 121/78  Pulse: 90  75   Resp: '20  17 20  '$ Temp:   97.8 F (36.6 C) 98.2 F (36.8 C)  TempSrc:   Oral Oral  SpO2:   98% 92%  Weight:  81.2 kg    Height:        Intake/Output Summary (Last 24 hours) at 01/17/2023 1250 Last data filed at 01/17/2023 1200 Gross per 24 hour  Intake 295 ml  Output 1900 ml  Net -1605 ml    Filed Weights   01/15/23 2123 01/16/23 0610 01/17/23 0500  Weight: 86.3 kg 85.4 kg 81.2 kg    Examination:  General exam: NAD Respiratory system: CTAB.  No wheezes, no crackles, no rhonchi.  Fair air movement.  Speaking in full sentences.  Cardiovascular system: Regular rate rhythm no murmurs rubs or gallops.  No JVD.  No significant lower extremity edema.  Gastrointestinal system: Abdomen is soft, nontender, nondistended, positive bowel sounds.  No rebound.  No guarding.  Central nervous system: Alert and oriented. No focal neurological deficits. Extremities: Bilateral chronic venous stasis changes noted.  Left lower extremity with Unna boot in place.  Thickened skin with hyperkeratosis of bilateral lower extremity. Skin: No rashes, lesions or ulcers Psychiatry: Judgement and insight appear normal. Mood & affect appropriate.     Data Reviewed: I have personally reviewed following labs and imaging studies  CBC: Recent Labs  Lab 01/15/23 1055 01/16/23 0047 01/17/23 0503  WBC 7.8 9.2 12.6*   NEUTROABS 5.9  --  10.0*  HGB 11.3* 10.8* 13.2  HCT 36.8 34.3* 43.0  MCV 94.8 93.7 94.7  PLT 192 216 232     Basic Metabolic Panel: Recent Labs  Lab 01/15/23 1055 01/16/23 0047 01/17/23 0852  NA 137 136 140  K 3.7 3.8 4.2  CL 102 101 95*  CO2 '24 26 27  '$ GLUCOSE 154* 174* 157*  BUN 29* 27* 27*  CREATININE 2.20* 2.29* 2.02*  CALCIUM 8.7* 8.6* 9.3  PHOS  --   --  4.0     GFR: Estimated Creatinine Clearance: 21.8 mL/min (A) (by C-G formula based on SCr of 2.02 mg/dL (H)).  Liver Function Tests: Recent Labs  Lab 01/15/23 1055 01/16/23 0047 01/17/23 0852  AST 18 16  --   ALT 10 11  --   ALKPHOS 73 63  --   BILITOT 0.7 0.8  --   PROT 7.3 6.5  --   ALBUMIN 3.9 3.8 4.0     CBG: Recent Labs  Lab 01/16/23 1650 01/16/23 2030 01/17/23 0437 01/17/23 0724 01/17/23 1115  GLUCAP 148* 135* 166* 154* 188*      Recent Results (from the past 240 hour(s))  Blood Cultures x 2 sites     Status: None (Preliminary result)   Collection Time: 01/15/23  1:38 PM   Specimen: BLOOD  LEFT FOREARM  Result Value Ref Range Status   Specimen Description   Final    BLOOD LEFT FOREARM Performed at [facility], [place] [address], [place], [place] [address]    Special Requests   Final    BOTTLES DRAWN AEROBIC AND ANAEROBIC Blood Culture results may not be optimal due to an inadequate volume of blood received in culture bottles Performed at [facility] [address] [place], [place] [address]    Culture   Final    NO GROWTH 2 DAYS Performed at [facility] [address], [place], [place] [address]    Report Status PENDING  Incomplete         Radiology Studies: DG Chest 2 View  Result Date: 01/15/2023 CLINICAL DATA:  Lower extremity edema EXAM: CHEST - 2 VIEW COMPARISON:  08/02/2020 FINDINGS: Chronic cardiomegaly. Dual lead pacemaker. Possible pulmonary venous hypertension but no frank edema. No visible effusion. Mild chronic scarring at the  left lung base. IMPRESSION: Chronic cardiomegaly. Dual lead pacemaker. Possible pulmonary venous hypertension but no frank edema. Electronically Signed   By: Nelson Chimes M.D.   On: 01/15/2023 14:08        Scheduled Meds:  amiodarone  200 mg Oral Once per day on Mon Tue Wed Thu Fri Sat   atorvastatin  10 mg Oral Daily   insulin aspart  0-9 Units Subcutaneous TID WC   levothyroxine  88 mcg Oral q morning   metoprolol succinate  25 mg Oral Daily   OLANZapine zydis  5 mg Oral BID   mouth rinse  15 mL Mouth Rinse 4 times per day   rivaroxaban  15 mg Oral Q supper   torsemide  20 mg Oral BID   Continuous Infusions:  ceFEPime (MAXIPIME) IV 2 g (01/17/23 0008)   vancomycin       LOS: 2 days    Time spent: 35 minutes    Irine Seal, MD [facility]   To contact the attending provider between 7A-7P or the covering provider during after hours 7P-7A, please log into the web site [email or web address] and access using universal [place] password for that web site. If you do not have the password, please call the hospital operator.  01/17/2023, 12:50 PM

## 2023-01-17 NOTE — TOC Initial Note (Signed)
Transition of Care [facility]) - Initial/Assessment Note    Patient Details  Name: Lydia Myers MRN: [id number] Date of Birth: 04/07/1941  Transition of Care [facility]) CM/SW Contact:    Dessa Phi, RN Phone Number: 01/17/2023, 3:54 PM  Clinical Narrative:Attempted to get [facility] agency to accept for HHRN-these are not able to accept-[facility]. Supv aware. Wound care appt set. MD updated.                   Expected Discharge Plan: Home/Self Care Barriers to Discharge: Continued Medical Work up   Patient Goals and CMS Choice Patient states their goals for this hospitalization and ongoing recovery are::  (Home) CMS Medicare.gov Compare Post Acute Care list provided to:: Patient Choice offered to / list presented to : Patient [place] ownership interest in [facility].provided to:: Patient    Expected Discharge Plan and Services   Discharge Planning Services: CM Consult   Living arrangements for the past 2 months: [place]                                      Prior Living Arrangements/Services Living arrangements for the past 2 months: [place]     Do you feel safe going back to the place where you live?: Yes               Activities of Daily [place] Devices/Equipment: Cane (specify quad or straight), Walker (specify type) ADL Screening (condition at time of admission) Patient's cognitive ability adequate to safely complete daily activities?: No Is the patient deaf or have difficulty hearing?: No Does the patient have difficulty seeing, even when wearing glasses/contacts?: No Does the patient have difficulty concentrating, remembering, or making decisions?: No Patient able to express need for assistance with ADLs?: Yes Does the patient have difficulty dressing or bathing?: Yes Independently performs ADLs?: No Does the patient have difficulty walking or  climbing stairs?: Yes Weakness of Legs: Both Weakness of Arms/Hands: None  Permission Sought/Granted Permission sought to share information with : Case Manager                Emotional Assessment              Admission diagnosis:  Osteomyelitis ([place]) [M86.9] Osteomyelitis of ankle or foot, acute, left ([place]) [id number] Patient Active Problem List   Diagnosis Date Noted   Paroxysmal atrial fibrillation ([place]) 01/16/2023   Hypertension 01/16/2023   Type 2 diabetes mellitus with stage 3b chronic kidney disease, with long-term current use of insulin ([place]) 01/16/2023   Chronic diastolic CHF (congestive heart failure) ([place]) 01/16/2023   Osteomyelitis ([place]) 01/15/2023   Pain due to onychomycosis of toenails of both feet 04/10/2022   Affective psychosis ([place]) 09/06/2021   Anxiety 09/06/2021   Cardiomyopathy ([place]) 09/06/2021   Chronic diastolic heart failure ([place]) 09/06/2021   Chronic kidney disease, stage 3 unspecified ([place]) 09/06/2021   Chronic obstructive pulmonary disease ([place]) 09/06/2021   Constipation 09/06/2021   Diabetic renal disease ([place]) 09/06/2021   Hypercoagulable state ([place]) 09/06/2021   Hyperparathyroidism ([place]) 09/06/2021   Primary localized osteoarthritis of pelvic region and thigh 09/06/2021   Type 2 diabetes mellitus with diabetic nephropathy ([place]) 09/06/2021   Tachycardia-bradycardia syndrome ([place]) 03/16/2020   Pacemaker [id number]   Systolic dysfunction, left ventricle 12/12/2019   Acute renal failure superimposed on stage 2 chronic kidney disease ([place]) 12/12/2019  Hypothyroidism 07/04/2017   Dizziness 07/04/2017   Elevated d-dimer 07/04/2017   Klebsiella pneumoniae infection 07/04/2017   Atrial fibrillation with rapid ventricular response ([place]) 07/01/2017   Atrial fibrillation with RVR ([place]) 07/01/2017   Pulmonary edema cardiac cause ([place])    ATRIAL FLUTTER 11/09/2010   CHEST PAIN 11/09/2010   CARDIOMEGALY 11/08/2010   ASTHMA, UNSPECIFIED 11/10/2009    RESPIRATORY FAILURE, CHRONIC 11/10/2009   WEIGHT GAIN, ABNORMAL 11/10/2009   HYPERLIPIDEMIA 03/01/2008   Essential hypertension 03/01/2008   ALLERGIC RHINITIS 03/01/2008   SHORTNESS OF BREATH (SOB) 03/01/2008   PCP:  Collene Leyden, MD Pharmacy:   [facility] [place], [place] - [address] [place] [address] Phone: [phone] Fax: [phone]     Social Determinants of Health (SDOH) Social History: SDOH Screenings   Food Insecurity: No Food Insecurity (01/16/2023)  Housing: Low Risk  (01/16/2023)  Transportation Needs: No Transportation Needs (01/16/2023)  Utilities: Not At Risk (01/16/2023)  Tobacco Use: Low Risk  (01/15/2023)   SDOH Interventions:     Readmission Risk Interventions     No data to display

## 2023-01-18 DIAGNOSIS — M86172 Other acute osteomyelitis, left ankle and foot: Secondary | ICD-10-CM

## 2023-01-18 LAB — CBC WITH DIFFERENTIAL/PLATELET
Abs Immature Granulocytes: 0.07 10*3/uL (ref 0.00–0.07)
Basophils Absolute: 0 10*3/uL (ref 0.0–0.1)
Basophils Relative: 0 %
Eosinophils Absolute: 0.2 10*3/uL (ref 0.0–0.5)
Eosinophils Relative: 2 %
HCT: 37 % (ref 36.0–46.0)
Hemoglobin: 11.3 g/dL — ABNORMAL LOW (ref 12.0–15.0)
Immature Granulocytes: 1 %
Lymphocytes Relative: 8 %
Lymphs Abs: 0.9 10*3/uL (ref 0.7–4.0)
MCH: 29 pg (ref 26.0–34.0)
MCHC: 30.5 g/dL (ref 30.0–36.0)
MCV: 95.1 fL (ref 80.0–100.0)
Monocytes Absolute: 0.8 10*3/uL (ref 0.1–1.0)
Monocytes Relative: 8 %
Neutro Abs: 8.9 10*3/uL — ABNORMAL HIGH (ref 1.7–7.7)
Neutrophils Relative %: 81 %
Platelets: 235 10*3/uL (ref 150–400)
RBC: 3.89 MIL/uL (ref 3.87–5.11)
RDW: 13.5 % (ref 11.5–15.5)
WBC: 10.9 10*3/uL — ABNORMAL HIGH (ref 4.0–10.5)
nRBC: 0 % (ref 0.0–0.2)

## 2023-01-18 LAB — GLUCOSE, CAPILLARY
Glucose-Capillary: 189 mg/dL — ABNORMAL HIGH (ref 70–99)
Glucose-Capillary: 149 mg/dL — ABNORMAL HIGH (ref 70–99)
Glucose-Capillary: 191 mg/dL — ABNORMAL HIGH (ref 70–99)
Glucose-Capillary: 138 mg/dL — ABNORMAL HIGH (ref 70–99)

## 2023-01-18 LAB — BASIC METABOLIC PANEL
Anion gap: 16 — ABNORMAL HIGH (ref 5–15)
BUN: 37 mg/dL — ABNORMAL HIGH (ref 8–23)
CO2: 26 mmol/L (ref 22–32)
Calcium: 8.7 mg/dL — ABNORMAL LOW (ref 8.9–10.3)
Chloride: 97 mmol/L — ABNORMAL LOW (ref 98–111)
Creatinine, Ser: 2.52 mg/dL — ABNORMAL HIGH (ref 0.44–1.00)
GFR, Estimated: 18 mL/min — ABNORMAL LOW (ref >60)
Glucose, Bld: 185 mg/dL — ABNORMAL HIGH (ref 70–99)
Potassium: 3.8 mmol/L (ref 3.5–5.1)
Sodium: 139 mmol/L (ref 135–145)

## 2023-01-18 MED ORDER — CEPHALEXIN 500 MG PO CAPS
500.0000 mg | ORAL_CAPSULE | Freq: Three times a day (TID) | ORAL | Status: DC
  Administered 2023-01-18 – 2023-01-23 (×17): 500 mg via ORAL
  Filled 2023-01-18 (×17): qty 1

## 2023-01-18 MED ORDER — DOXYCYCLINE HYCLATE 100 MG PO TABS
100.0000 mg | ORAL_TABLET | Freq: Two times a day (BID) | ORAL | Status: DC
  Administered 2023-01-18 – 2023-01-23 (×11): 100 mg via ORAL
  Filled 2023-01-18 (×11): qty 1

## 2023-01-18 NOTE — Evaluation (Signed)
Physical Therapy Evaluation Patient Details Name: Lydia Myers MRN: [id number] DOB: 28-Feb-1941 Today's Date: 01/18/2023  History of Present Illness  Patient is a 82 year old female history of arthritis, COPD/asthma, type 2 diabetes, paroxysmal A-fib on Xarelto amiodarone CHF, CHB status post PPM, CKD stage IIIb-4, hypertension hypothyroidism chronic venous stasis changes, glaucoma presented to the ED at the urging of family due to worsening leg wound on the left lower extremity  Clinical Impression  Pt admitted with above diagnosis.  Pt currently with functional limitations due to the deficits listed below (see PT Problem List). Pt will benefit from skilled PT to increase their independence and safety with mobility to allow discharge to the venue listed below.   Patient resting in recliner, irritable and states noone comes when she calls. Assisted patient to stand from recliner with mod a to power up. Ambulated  x 20' x 2 to BR.  SPO2 on 3 l resting 99%, resting on RA 99%. 84 % on RA  after amb in room. Replaced to 3 LPM -94%.  Patient currently demonstrates desaturation with activity on RA.   Patient  lives alone in apartment, daughter lives out of town, unsure that daughter is available PRN. Patient currently will benefit from 24/7 assistance, esp if supplemental O2 is needed. Continue mobility.     Recommendations for follow up therapy are one component of a multi-disciplinary discharge planning process, led by the attending physician.  Recommendations may be updated based on patient status, additional functional criteria and insurance authorization.  Follow Up Recommendations Home health PT vs SNF if 24/7 not available- pt. Most like will decline SNF,       Assistance Recommended at Discharge Frequent or constant Supervision/Assistance  Patient can return home with the following  A little help with walking and/or transfers;A little help with bathing/dressing/bathroom;Help with stairs  or ramp for entrance;Assist for transportation;Direct supervision/assist for medications management;Assistance with cooking/housework    Equipment Recommendations Rolling walker (2 wheels);BSC/3in1  Recommendations for Other Services       Functional Status Assessment Patient has had a recent decline in their functional status and demonstrates the ability to make significant improvements in function in a reasonable and predictable amount of time.     Precautions / Restrictions Precautions Precautions: Fall Precaution Comments: monitor sats      Mobility  Bed Mobility               General bed mobility comments: in recliner    Transfers Overall transfer level: Needs assistance Equipment used: Rolling walker (2 wheels) Transfers: Sit to/from Stand Sit to Stand: Min assist, Mod assist           General transfer comment: assist to power up from recliner, min assist from [facility] , pulling on rail to rise.    Ambulation/Gait Ambulation/Gait assistance: Min assist Gait Distance (Feet): 20 Feet (x 2) Assistive device: Rolling walker (2 wheels) Gait Pattern/deviations: Step-to pattern, Trunk flexed, Decreased step length - left, Decreased step length - right Gait velocity: decr     General Gait Details: slow  cadence, able to turn with Rw in BR,small space.  Stairs            Wheelchair Mobility    Modified Rankin (Stroke Patients Only)       Balance Overall balance assessment: Needs assistance Sitting-balance support: Feet supported, No upper extremity supported Sitting balance-Leahy Scale: Good     Standing balance support: During functional activity, Single extremity supported Standing balance-Leahy Scale: [place] Standing  balance comment: standing for pericare, min guard                             Pertinent Vitals/Pain Pain Assessment Pain Assessment: No/denies pain    Home Living Family/patient expects to be discharged to:: Private  residence Living Arrangements: Alone Available Help at Discharge: Family;Available PRN/intermittently Type of Home: Apartment Home Access: Level entry       Home Layout: One level Home Equipment: Conservation officer, nature (2 wheels) Additional Comments: asked about new RW and 3 in 1, dtr lives in [place], takes care of groceries and  finances,    Prior Function Prior Level of Function : Independent/Modified Independent;Patient poor historian/Family not available             Mobility Comments: mod I in apt       Hand Dominance   Dominant Hand: Right    Extremity/Trunk Assessment   Upper Extremity Assessment Upper Extremity Assessment: Overall WFL for tasks assessed    Lower Extremity Assessment Lower Extremity Assessment: Generalized weakness    Cervical / Trunk Assessment Cervical / Trunk Assessment: Normal  Communication   Communication: No difficulties  Cognition Arousal/Alertness: Awake/alert Behavior During Therapy: WFL for tasks assessed/performed Overall Cognitive Status: No family/caregiver present to determine baseline cognitive functioning Area of Impairment: Orientation                 Orientation Level: Time             General Comments: very disgruntled, states noone comes when she calls.        General Comments      Exercises     Assessment/Plan    PT Assessment Patient needs continued PT services  PT Problem List Decreased strength;Decreased cognition;Decreased activity tolerance;Decreased knowledge of use of DME;Decreased safety awareness;Decreased mobility;Cardiopulmonary status limiting activity       PT Treatment Interventions DME instruction;Therapeutic exercise;Gait training;Functional mobility training;Therapeutic activities;Patient/family education    PT Goals (Current goals can be found in the Care Plan section)  Acute Rehab PT Goals Patient Stated Goal: to go home PT Goal Formulation: With patient Time For Goal  Achievement: 02/01/23 Potential to Achieve Goals: Good    Frequency Min 3X/week     Co-evaluation               AM-PAC PT "6 Clicks" Mobility  Outcome Measure Help needed turning from your back to your side while in a flat bed without using bedrails?: A Little Help needed moving from lying on your back to sitting on the side of a flat bed without using bedrails?: A Little Help needed moving to and from a bed to a chair (including a wheelchair)?: A Little Help needed standing up from a chair using your arms (e.g., wheelchair or bedside chair)?: A Lot Help needed to walk in hospital room?: A Lot Help needed climbing 3-5 steps with a railing? : Total 6 Click Score: 14    End of Session Equipment Utilized During Treatment: Gait belt Activity Tolerance: Patient tolerated treatment well Patient left: in chair;with call bell/phone within reach;with chair alarm set Nurse Communication: Mobility status (sats dropped) PT Visit Diagnosis: Unsteadiness on feet (R26.81);Difficulty in walking, not elsewhere classified (R26.2)    Time: [id number] PT Time Calculation (min) (ACUTE ONLY): 39 min   Charges:   PT Evaluation $PT Eval Low Complexity: 1 Low PT Treatments $Gait Training: 8-22 mins $Self Care/Home Management: 8-22  [place] Office [phone] Weekend pager-[phone]   Claretha Cooper 01/18/2023, 10:03 AM

## 2023-01-18 NOTE — Progress Notes (Signed)
PROGRESS NOTE    Lydia Myers  [id number] DOB: 1941/04/26 DOA: 01/15/2023 PCP: Collene Leyden, MD    Chief Complaint  Patient presents with   Leg Swelling    Brief Narrative:  Patient is a 82 year old female history of arthritis, COPD/asthma, type 2 diabetes, paroxysmal A-fib on Xarelto amiodarone CHF, CHB status post PPM, CKD stage IIIb-4, hypertension hypothyroidism chronic venous stasis changes, glaucoma presented to the ED at the urging of family due to worsening leg wound on the left lower extremity with malodorous.  It is noted that per chart caregiver stated wound has been progressive x 3 weeks.  Patient admitted, plain films done with concerns for possible osteomyelitis however patient unable to get MRI due to PPM.  Orthopedics consulted who recommended vascular surgery evaluation.   Assessment & Plan:   Principal Problem:   Osteomyelitis ([place]) Active Problems:   Paroxysmal atrial fibrillation (HCC)   Hypertension   Type 2 diabetes mellitus with stage 3b chronic kidney disease, with long-term current use of insulin (HCC)   Chronic diastolic CHF (congestive heart failure) ([place])   #1 left calcaneal bone/concern for osteomyelitis of the left calcaneal tuberosity -Patient presented with a 3-week history of worsening left lower extremity wound malodorous. -Plain films done concerning for possible osteomyelitis recommended MRI however MRI unable to be done due to history of PPM. -CRP noted at 0.6.  Lactic acid at 1.2. -Continue empiric IV vancomycin, IV cefepime. -Patient seen in consultation by orthopedics, who are recommending continuation of IV antibiotics and evaluation by vascular surgery who notes that patient had a previous ABI that was normal, patient noted to have a palpable left dorsalis pedis pulse, no signs of significant arterial insufficiency do not feel patient needs an arteriogram and does not feel this is an ischemic ulcer.   -Per vascular surgery may be  related to chronic venous insufficiency versus lymphedema.   -Vascular surgery notes patient has an appointment on 01/31/2023 with vascular surgery for venous reflux study and further workup and recommending wound care evaluate patient to place Unna boot to the left lower extremity with referral to outpatient wound care center for Unna boot changes.   -Continue plan per orthopedics and vascular surgery. -continue on IV abx per above   2.  COPD/asthma -Stable. -Continue nebs as needed.  3.  Paroxysmal atrial fibrillation -Continue amiodarone and Toprol-XL for rate control. -Xarelto initially held due to concern for possible procedure however no procedure planned at this time per vascular or per orthopedic surgery and as such had resumed home regimen Xarelto.   4.  Diabetes mellitus type 2 -Hemoglobin A1c 6.7 (12/09/2019) -CBG noted at 154 this morning. -Hemoglobin A1c 8.8 (01/16/2023 ). -Continue to hold home regimen long-acting Levemir.   -SSI.    5.  Hypothyroidism -Synthroid.  6.  Chronic diastolic CHF -Euvolemic.   -Continue metoprolol succinate, torsemide, Lipitor.   7.  CHB status post PPM -Stable.  8.  CKD stage IIIb-IV -Currently stable at baseline. -Continue home regimen metoprolol succinate, torsemide. -Outpatient follow-up with nephrology.  9.  Glaucoma -Resume home regimen eyedrops.  10.  Anxiety -Xanax as needed.  11.??  Sundowning -Patient with some agitation, irritability per RN overnight had to require some IV Haldol. -Place on Zyprexa 5 mg p.o. twice daily. -This AM, conversing appropriately  DVT prophylaxis: Xarelto. Code Status: Full Family Communication: Updated patient.  Pt's sister at bedside. Disposition: TBD  Status is: Inpatient Remains inpatient appropriate because: Severity of illness   Consultants:  Orthopedics:  01/16/2023 Wound care RN 01/16/2023 Vascular surgery: Dr. Carlis Abbott 01/16/2023  Procedures:  Chest x-ray  01/15/2023   Antimicrobials:  IV cefepime 01/15/2023>>>>> IV Zosyn 312 2024 x 1 dose IV vancomycin 01/15/2023>>>>>   Subjective: In good spirits this AM. Without complaints or concerns.   Objective: Vitals:   01/17/23 1310 01/17/23 2021 01/18/23 0505 01/18/23 1349  BP: (!) 99/55 (!) 152/79 (!) 157/139 (!) 101/54  Pulse: 69 65 68 74  Resp: 14 18 19 16   Temp: (!) 97.5 F (36.4 C) (!) 97.3 F (36.3 C) (!) 97.5 F (36.4 C) 97.6 F (36.4 C)  TempSrc: Oral Oral Oral Oral  SpO2: 98% 100% 100% 100%  Weight:      Height:        Intake/Output Summary (Last 24 hours) at 01/18/2023 1448 Last data filed at 01/18/2023 1300 Gross per 24 hour  Intake 811.92 ml  Output 900 ml  Net -88.08 ml    Filed Weights   01/15/23 2123 01/16/23 0610 01/17/23 0500  Weight: 86.3 kg 85.4 kg 81.2 kg    Examination: General exam: Awake, laying in bed, in nad Respiratory system: Normal respiratory effort, no wheezing Cardiovascular system: regular rate, s1, s2 Gastrointestinal system: Soft, nondistended, positive BS Central nervous system: CN2-12 grossly intact, strength intact Extremities: Perfused, no clubbing Skin: Normal skin turgor, no notable skin lesions seen Psychiatry: Mood normal // no visual hallucinations   Data Reviewed: I have personally reviewed following labs and imaging studies  CBC: Recent Labs  Lab 01/15/23 1055 01/16/23 0047 01/17/23 0503 01/18/23 0541  WBC 7.8 9.2 12.6* 10.9*  NEUTROABS 5.9  --  10.0* 8.9*  HGB 11.3* 10.8* 13.2 11.3*  HCT 36.8 34.3* 43.0 37.0  MCV 94.8 93.7 94.7 95.1  PLT 192 216 232 235     Basic Metabolic Panel: Recent Labs  Lab 01/15/23 1055 01/16/23 0047 01/17/23 0852 01/18/23 0541  NA 137 136 140 139  K 3.7 3.8 4.2 3.8  CL 102 101 95* 97*  CO2 24 26 27 26   GLUCOSE 154* 174* 157* 185*  BUN 29* 27* 27* 37*  CREATININE 2.20* 2.29* 2.02* 2.52*  CALCIUM 8.7* 8.6* 9.3 8.7*  PHOS  --   --  4.0  --      GFR: Estimated Creatinine  Clearance: 17.4 mL/min (A) (by C-G formula based on SCr of 2.52 mg/dL (H)).  Liver Function Tests: Recent Labs  Lab 01/15/23 1055 01/16/23 0047 01/17/23 0852  AST 18 16  --   ALT 10 11  --   ALKPHOS 73 63  --   BILITOT 0.7 0.8  --   PROT 7.3 6.5  --   ALBUMIN 3.9 3.8 4.0     CBG: Recent Labs  Lab 01/17/23 1115 01/17/23 1643 01/17/23 2017 01/18/23 0726 01/18/23 1114  GLUCAP 188* 194* 133* 189* 149*      Recent Results (from the past 240 hour(s))  Blood Cultures x 2 sites     Status: None (Preliminary result)   Collection Time: 01/15/23  1:38 PM   Specimen: BLOOD LEFT FOREARM  Result Value Ref Range Status   Specimen Description   Final    BLOOD LEFT FOREARM Performed at [facility], [place] [address] [place], [place] [address]    Special Requests   Final    BOTTLES DRAWN AEROBIC AND ANAEROBIC Blood Culture results may not be optimal due to an inadequate volume of blood received in culture bottles Performed at [facility]  [address], [place], [place] [address]    Culture   Final    NO GROWTH 3 DAYS Performed at [facility], [employer] [address], [place], [place] [address]    Report Status PENDING  Incomplete         Radiology Studies: No results found.      Scheduled Meds:  amiodarone  200 mg Oral Once per day on Mon Tue Wed Thu Fri Sat   atorvastatin  10 mg Oral Daily   cephALEXin  500 mg Oral Q8H   doxycycline  100 mg Oral Q12H   insulin aspart  0-9 Units Subcutaneous TID WC   levothyroxine  88 mcg Oral q morning   metoprolol succinate  25 mg Oral Daily   OLANZapine zydis  5 mg Oral BID   mouth rinse  15 mL Mouth Rinse 4 times per day   rivaroxaban  15 mg Oral Q supper   saccharomyces boulardii  250 mg Oral BID   torsemide  20 mg Oral BID   Continuous Infusions:     LOS: 3 days   Marylu Lund, MD [facility]   To contact the attending provider between 7A-7P or the covering provider during after  hours 7P-7A, please log into the web site [email or web address] and access using universal [place] password for that web site. If you do not have the password, please call the hospital operator.  01/18/2023, 2:48 PM

## 2023-01-18 NOTE — TOC Transition Note (Signed)
Transition of Care [facility]) - CM/SW Discharge Note   Patient Details  Name: Lydia Myers MRN: [id number] Date of Birth: [birth year]  Transition of Care [facility]) CM/SW Contact:  Dessa Phi, RN Phone Number: 01/18/2023, 11:19 AM   Clinical Narrative:  [facility] accepted for HHR/HHPT;wnd care clinic already set up.      Final next level of care: Home w Home Health Services Barriers to Discharge: No Barriers Identified   Patient Goals and CMS Choice CMS Medicare.gov Compare Post Acute Care list provided to:: Patient Choice offered to / list presented to : Patient  Discharge Placement                         Discharge Plan and Services Additional resources added to the After Visit Summary for     Discharge Planning Services: CM Consult                      HH Arranged: RN, PT [facility] Agency: [place] Date [facility] Agency Contacted: 01/18/23 Time [place]: 1119 Representative spoke with at [place]: [place] Determinants of Health ([place]) Interventions SDOH Screenings   Food Insecurity: No Food Insecurity (01/16/2023)  Housing: Low Risk  (01/16/2023)  Transportation Needs: No Transportation Needs (01/16/2023)  Utilities: Not At Risk (01/16/2023)  Tobacco Use: Low Risk  (01/15/2023)     Readmission Risk Interventions     No data to display

## 2023-01-19 ENCOUNTER — Inpatient Hospital Stay (HOSPITAL_COMMUNITY): Payer: Medicare Other

## 2023-01-19 ENCOUNTER — Other Ambulatory Visit (HOSPITAL_COMMUNITY): Payer: Self-pay

## 2023-01-19 DIAGNOSIS — J449 Chronic obstructive pulmonary disease, unspecified: Secondary | ICD-10-CM | POA: Diagnosis not present

## 2023-01-19 DIAGNOSIS — M869 Osteomyelitis, unspecified: Secondary | ICD-10-CM | POA: Diagnosis not present

## 2023-01-19 DIAGNOSIS — I480 Paroxysmal atrial fibrillation: Secondary | ICD-10-CM | POA: Diagnosis not present

## 2023-01-19 DIAGNOSIS — E039 Hypothyroidism, unspecified: Secondary | ICD-10-CM | POA: Diagnosis not present

## 2023-01-19 LAB — CBC
HCT: 35.6 % — ABNORMAL LOW (ref 36.0–46.0)
Hemoglobin: 11.1 g/dL — ABNORMAL LOW (ref 12.0–15.0)
MCH: 29.4 pg (ref 26.0–34.0)
MCHC: 31.2 g/dL (ref 30.0–36.0)
MCV: 94.4 fL (ref 80.0–100.0)
Platelets: 220 10*3/uL (ref 150–400)
RBC: 3.77 MIL/uL — ABNORMAL LOW (ref 3.87–5.11)
RDW: 13.5 % (ref 11.5–15.5)
WBC: 10 10*3/uL (ref 4.0–10.5)
nRBC: 0 % (ref 0.0–0.2)

## 2023-01-19 LAB — COMPREHENSIVE METABOLIC PANEL
ALT: 12 U/L (ref 0–44)
AST: 17 U/L (ref 15–41)
Albumin: 3.3 g/dL — ABNORMAL LOW (ref 3.5–5.0)
Alkaline Phosphatase: 62 U/L (ref 38–126)
Anion gap: 16 — ABNORMAL HIGH (ref 5–15)
BUN: 46 mg/dL — ABNORMAL HIGH (ref 8–23)
CO2: 26 mmol/L (ref 22–32)
Calcium: 9 mg/dL (ref 8.9–10.3)
Chloride: 94 mmol/L — ABNORMAL LOW (ref 98–111)
Creatinine, Ser: 2.86 mg/dL — ABNORMAL HIGH (ref 0.44–1.00)
GFR, Estimated: 16 mL/min — ABNORMAL LOW (ref >60)
Glucose, Bld: 169 mg/dL — ABNORMAL HIGH (ref 70–99)
Potassium: 3.7 mmol/L (ref 3.5–5.1)
Sodium: 136 mmol/L (ref 135–145)
Total Bilirubin: 0.8 mg/dL (ref 0.3–1.2)
Total Protein: 6.6 g/dL (ref 6.5–8.1)

## 2023-01-19 LAB — GLUCOSE, CAPILLARY
Glucose-Capillary: 167 mg/dL — ABNORMAL HIGH (ref 70–99)
Glucose-Capillary: 217 mg/dL — ABNORMAL HIGH (ref 70–99)
Glucose-Capillary: 169 mg/dL — ABNORMAL HIGH (ref 70–99)
Glucose-Capillary: 189 mg/dL — ABNORMAL HIGH (ref 70–99)

## 2023-01-19 LAB — BRAIN NATRIURETIC PEPTIDE: B Natriuretic Peptide: 156.9 pg/mL — ABNORMAL HIGH (ref 0.0–100.0)

## 2023-01-19 MED ORDER — TORSEMIDE 20 MG PO TABS: 20.0000 mg | ORAL_TABLET | Freq: Two times a day (BID) | ORAL | 1 refills | Status: AC

## 2023-01-19 MED ORDER — SACCHAROMYCES BOULARDII 250 MG PO CAPS: 250.0000 mg | ORAL_CAPSULE | Freq: Two times a day (BID) | ORAL | Status: AC

## 2023-01-19 MED ORDER — DOXYCYCLINE HYCLATE 100 MG PO TABS
100.0000 mg | ORAL_TABLET | Freq: Two times a day (BID) | ORAL | 0 refills | Status: DC
  Filled 2023-01-19: qty 8, 4d supply, fill #0

## 2023-01-19 MED ORDER — FUROSEMIDE 10 MG/ML IJ SOLN
40.0000 mg | Freq: Once | INTRAMUSCULAR | Status: AC
  Administered 2023-01-19: 40 mg via INTRAVENOUS
  Filled 2023-01-19: qty 4

## 2023-01-19 MED ORDER — ORAL CARE MOUTH RINSE: 15.0000 mL | OROMUCOSAL | Status: DC | PRN

## 2023-01-19 MED ORDER — CEPHALEXIN 500 MG PO CAPS
500.0000 mg | ORAL_CAPSULE | Freq: Three times a day (TID) | ORAL | 0 refills | Status: DC
  Filled 2023-01-19: qty 12, 4d supply, fill #0

## 2023-01-19 MED ORDER — TORSEMIDE 20 MG PO TABS
20.0000 mg | ORAL_TABLET | Freq: Two times a day (BID) | ORAL | Status: DC
  Filled 2023-01-19: qty 1

## 2023-01-19 NOTE — Progress Notes (Signed)
Occupational Therapy Treatment Patient Details Name: Lydia Myers MRN: [id number] DOB: 10-Apr-1941 Today's Date: 01/19/2023   History of present illness Patient is a 82 year old female history of arthritis, COPD/asthma, type 2 diabetes, paroxysmal A-fib on Xarelto amiodarone CHF, CHB status post PPM, CKD stage IIIb-4, hypertension hypothyroidism chronic venous stasis changes, glaucoma presented to the ED at the urging of family due to worsening leg wound on the left lower extremity   OT comments  Patient participated in cognitive assessments today as outlined below. Patient's score indicated cognitive impairment with concerns over patients ability to manage medication in next level of care as patient lives at home alone are high at this time. Patient had difficulty problem solving through medication management questions and does not have a formal way to manage medications at home.  Patient would benefit from continued OT intervention to work on medication management strategies in next level of care.    Recommendations for follow up therapy are one component of a multi-disciplinary discharge planning process, led by the attending physician.  Recommendations may be updated based on patient status, additional functional criteria and insurance authorization.    Follow Up Recommendations  Skilled nursing-short term rehab (<3 hours/day)     Assistance Recommended at Discharge Intermittent Supervision/Assistance  Patient can return home with the following  A lot of help with bathing/dressing/bathroom;Assistance with cooking/housework;Direct supervision/assist for medications management;Assist for transportation;Direct supervision/assist for financial management   Equipment Recommendations  None recommended by OT       Precautions / Restrictions Precautions Precautions: Fall Precaution Comments: monitor sats, left leg wound, incontinent urine-on Lasix Restrictions Weight Bearing Restrictions:  No              ADL either performed or assessed with clinical judgement      Cognition Arousal/Alertness: Awake/alert Behavior During Therapy: WFL for tasks assessed/performed Overall Cognitive Status: No family/caregiver present to determine baseline cognitive functioning         General Comments: Patient was plesant during session and cooperative with cognitive assessments. patient reported living at home alone. during session, patient was provided with sinario of forgetting to take a morning medication and was asked how would you check your medications to see if you had taken all the necessary pills for the day. patient reported " i would just go take another one" or "call the nurse". patient was educated on benfits of pill box and compenstatory strategies to medication managemnet. patient reported she understrood. when patietn was educated that medication miss management was one of the leading causes of patients coming back to the hospital, patient reported "oh no i need to do this well because i never want to come back here". patient during cognitive assessment Mini Mental State Exam refered to this hosptial as "the one i hate" when asked to name it. patient scored 21/30 with a score of 23 or lower indicating cogntiive impariment. patients clock draw as completed as shown in picture below.                   Pertinent Vitals/ Pain       Pain Assessment Pain Assessment: No/denies pain         Frequency  Min 2X/week        Progress Toward Goals  OT Goals(current goals can now be found in the care plan section)  Progress towards OT goals: OT to reassess next treatment     Plan Discharge plan remains appropriate       AM-PAC OT "  6 Clicks" Daily Activity     Outcome Measure   Help from another person eating meals?: A Little Help from another person taking care of personal grooming?: A Little Help from another person toileting, which includes using toliet,  bedpan, or urinal?: A Little Help from another person bathing (including washing, rinsing, drying)?: A Lot Help from another person to put on and taking off regular upper body clothing?: A Little Help from another person to put on and taking off regular lower body clothing?: A Lot 6 Click Score: 16    End of Session    OT Visit Diagnosis: Pain   Activity Tolerance Patient tolerated treatment well   Patient Left in bed;with call bell/phone within reach;with nursing/sitter in room;with bed alarm set   Nurse Communication Other (comment) (ok to participate in session)        Time: [id number] OT Time Calculation (min): 15 min  Charges: OT General Charges $OT Visit: 1 Visit OT Treatments $Self Care/Home Management : 8-22 mins  Rennie Plowman, MS Acute Rehabilitation Department Office# [place] 01/19/2023, 3:11 PM

## 2023-01-19 NOTE — Progress Notes (Signed)
Physical Therapy Treatment Patient Details Name: Lydia Myers MRN: [id number] DOB: 1941/05/16 Today's Date: 01/19/2023   History of Present Illness Patient is a 82 year old female history of arthritis, COPD/asthma, type 2 diabetes, paroxysmal A-fib on Xarelto amiodarone CHF, CHB status post PPM, CKD stage IIIb-4, hypertension hypothyroidism chronic venous stasis changes, glaucoma presented to the ED at the urging of family due to worsening leg wound on the left lower extremity    PT Comments    Nursing reports daughter is not  available to provide assistance, patient reports that her sister will help. No family present. Patient currently will require 24/7 assistance, patient is incontinent and currently requires supplemental O2 .   Patient ambulated farther and  with min guard using RW x 60'. Drop in saturation on  RA to 84%.   Resting -96% on 2 LPM, 92% on RA resting, 84% on RA ambulating. Replaced  on  2 LPM.  Did not have a chance to amb on  O2 as patient was incontinent of urine.       Recommendations for follow up therapy are one component of a multi-disciplinary discharge planning process, led by the attending physician.  Recommendations may be updated based on patient status, additional functional criteria and insurance authorization.  Follow Up Recommendations  Skilled nursing-short term rehab (<3 hours/day) Can patient physically be transported by private vehicle: Yes   Assistance Recommended at Discharge Frequent or constant Supervision/Assistance  Patient can return home with the following A little help with walking and/or transfers;A little help with bathing/dressing/bathroom;Help with stairs or ramp for entrance;Assist for transportation;Direct supervision/assist for medications management;Assistance with cooking/housework   Equipment Recommendations  Rolling walker (2 wheels);BSC/3in1    Recommendations for Other Services       Precautions / Restrictions  Precautions Precautions: Fall Precaution Comments: monitor sats, left leg wound, incontinent urine-on Lasix Restrictions Weight Bearing Restrictions: No     Mobility  Bed Mobility               General bed mobility comments: in recliner    Transfers Overall transfer level: Needs assistance Equipment used: Rolling walker (2 wheels) Transfers: Sit to/from Stand Sit to Stand: Min assist           General transfer comment: rocks to stand from recliner x 2    Ambulation/Gait Ambulation/Gait assistance: Counsellor (Feet): 60 Feet Assistive device: Rolling walker (2 wheels) Gait Pattern/deviations: Step-to pattern, Step-through pattern Gait velocity: decr     General Gait Details: slow  cadence, able to turn with Rw in in hall   Stairs             Wheelchair Mobility    Modified Rankin (Stroke Patients Only)       Balance   Sitting-balance support: Feet supported, No upper extremity supported Sitting balance-Leahy Scale: Good     Standing balance support: During functional activity, Single extremity supported Standing balance-Leahy Scale: Fair                              Cognition Arousal/Alertness: Awake/alert Behavior During Therapy: Agitated                                   General Comments: oriented to hospital, reports that she cannot take care of seklf but sister will help, not aware of deficits  Exercises      General Comments        Pertinent Vitals/Pain Pain Assessment Pain Assessment: No/denies pain    Home Living                          Prior Function            PT Goals (current goals can now be found in the care plan section) Progress towards PT goals: Progressing toward goals    Frequency    Min 3X/week      PT Plan Discharge plan needs to be updated    Co-evaluation              AM-PAC PT "6 Clicks" Mobility   Outcome Measure  Help  needed turning from your back to your side while in a flat bed without using bedrails?: A Little Help needed moving from lying on your back to sitting on the side of a flat bed without using bedrails?: A Little Help needed moving to and from a bed to a chair (including a wheelchair)?: A Little Help needed standing up from a chair using your arms (e.g., wheelchair or bedside chair)?: A Little Help needed to walk in hospital room?: A Little Help needed climbing 3-5 steps with a railing? : A Lot 6 Click Score: 17    End of Session Equipment Utilized During Treatment: Gait belt;Oxygen Activity Tolerance: Patient tolerated treatment well Patient left: in chair;with call bell/phone within reach;with chair alarm set Nurse Communication: Mobility status PT Visit Diagnosis: Unsteadiness on feet (R26.81);Difficulty in walking, not elsewhere classified (R26.2)     Time: [id number] PT Time Calculation (min) (ACUTE ONLY): 25 min  Charges:  $Gait Training: 23-37 mins                     [place] Office [phone] Weekend [phone]    Claretha Cooper 01/19/2023, 2:29 PM

## 2023-01-19 NOTE — Progress Notes (Signed)
   01/19/23 1216  Vitals  Pulse Rate 68  Pulse Rate Source Monitor  MEWS COLOR  MEWS Score Color Green  Oxygen Therapy  SpO2 (!) 88 %  O2 Device Room Air  MEWS Score  MEWS Temp 0  MEWS Systolic 0  MEWS Pulse 0  MEWS RR 0  MEWS LOC 0  MEWS Score 0  Provider Notification  Provider Name/Title [place] Silos  Date Provider Notified 01/19/23  Time Provider Notified 1218  Method of Notification Face-to-face  Notification Reason Other (Comment) (O2 sat 83-88% on RA at rest)  Provider response See new orders

## 2023-01-19 NOTE — Progress Notes (Addendum)
PROGRESS NOTE    Lydia Myers  [id number] DOB: 1941-04-05 DOA: 01/15/2023 PCP: Collene Leyden, MD    Chief Complaint  Patient presents with   Leg Swelling    Brief Narrative:  Patient is a 82 year old female history of arthritis, COPD/asthma, type 2 diabetes, paroxysmal A-fib on Xarelto amiodarone CHF, CHB status post PPM, CKD stage IIIb-4, hypertension hypothyroidism chronic venous stasis changes, glaucoma presented to the ED at the urging of family due to worsening leg wound on the left lower extremity with malodorous.  It is noted that per chart caregiver stated wound has been progressive x 3 weeks.  Patient admitted, plain films done with concerns for possible osteomyelitis however patient unable to get MRI due to PPM.  Orthopedics consulted who recommended vascular surgery evaluation.   Assessment & Plan:   Principal Problem:   Osteomyelitis ([place]) Active Problems:   Paroxysmal atrial fibrillation (HCC)   Hypertension   Type 2 diabetes mellitus with stage 3b chronic kidney disease, with long-term current use of insulin (HCC)   Chronic diastolic CHF (congestive heart failure) ([place])   #1 left calcaneal bone/concern for osteomyelitis of the left calcaneal tuberosity -Patient presented with a 3-week history of worsening left lower extremity wound malodorous. -Plain films done concerning for possible osteomyelitis recommended MRI however MRI unable to be done due to history of PPM. -CRP noted at 0.6.  Lactic acid at 1.2. -Was on empiric IV vancomycin, IV cefepime. -Patient now transitioned to Keflex and doxycycline as of 01/18/2023. -Patient seen in consultation by orthopedics, who are recommending continuation of IV antibiotics and evaluation by vascular surgery who notes that patient had a previous ABI that was normal, patient noted to have a palpable left dorsalis pedis pulse, no signs of significant arterial insufficiency do not feel patient needs an arteriogram and does not  feel this is an ischemic ulcer.   -Per vascular surgery may be related to chronic venous insufficiency versus lymphedema.   -Vascular surgery notes patient has an appointment on 01/31/2023 with vascular surgery for venous reflux study and further workup and recommending wound care evaluate patient to place Unna boot to the left lower extremity with referral to outpatient wound care center for Unna boot changes.   -Per orthopedics and vascular surgery.   2.  COPD/asthma -Stable. -Continue nebs as needed.  3.  Paroxysmal atrial fibrillation -Continue amiodarone and Toprol-XL for rate control. -Xarelto initially held due to concern for possible procedure however no procedure planned at this time per vascular or per orthopedic surgery and as such Xarelto has been resumed.   4.  Diabetes mellitus type 2 -Hemoglobin A1c 6.7 (12/09/2019) -CBG noted at 167 this morning. -Hemoglobin A1c 8.8 (01/16/2023 ). -Continue to hold home regimen long-acting Levemir.   -SSI.    5.  Hypothyroidism -Continue home regimen Synthroid.   6.  Chronic diastolic CHF -Euvolemic clinically however patient with some hypoxia with sats of approximately 88% on room air per RN and 84% per PT. -Continue metoprolol succinate, Lipitor.  -Check a chest x-ray, BNP. -Hold torsemide today and give a dose of Lasix 40 mg IV x 1.  7.  CHB status post PPM -Stable.  8.  CKD stage IIIb-IV -Currently stable at baseline. -Continue home regimen metoprolol succinate.  Hold torsemide today. -Will give a dose of Lasix IV x 1 as patient noted with a hypoxia on room air and states has not been on O2 prior to admission. -Resume torsemide tomorrow. -Outpatient follow-up with nephrology.  9.  Glaucoma -Continue home regimen eyedrops.  10.  Anxiety -Xanax as needed.  11.??  Sundowning -Patient with some agitation, irritability per RN early on during the hospitalization requiring IV Haldol.  -Improvement on Zyprexa 5 mg twice daily.   -Follow.  12.  Hypoxia -Per PT note patient noted to be hypoxic with sats of 84% on room air. -Patient states has not been on oxygen prior to admission. -Check a BNP, chest x-ray. -Hold torsemide today. -Lasix 40 mg IV x 1. -Strict I's and O's.    DVT prophylaxis: Xarelto. Code Status: Full Family Communication: Updated patient.  Updated son at bedside.  Disposition: TBD  Status is: Inpatient Remains inpatient appropriate because: Severity of illness   Consultants:  Orthopedics: 01/16/2023 Wound care RN 01/16/2023 Vascular surgery: Dr. Carlis Abbott 01/16/2023  Procedures:  Chest x-ray 01/15/2023, 01/19/2023   Antimicrobials:  IV cefepime 01/15/2023>>>>> 01/18/2023 IV Zosyn 312 2024 x 1 dose IV vancomycin 01/15/2023>>>>> 01/18/2023 Keflex 01/18/2023>>>> Oral doxycycline 01/18/2023>>>>>>   Subjective: Patient laying in bed.  On 2 L O2.  States she is not on home O2 prior to admission.  No chest pain.  No shortness of breath.  No abdominal pain.  Alert oriented to self place and time.  Answering questions appropriately.  Son at bedside.    Objective: Vitals:   01/18/23 1349 01/18/23 2102 01/19/23 0549 01/19/23 1141  BP: (!) 101/54 (!) 140/72 (!) 138/55 (!) 130/99  Pulse: 74 72 65 65  Resp: 16 20 17 19   Temp: 97.6 F (36.4 C) 98.2 F (36.8 C) 97.7 F (36.5 C) 98.1 F (36.7 C)  TempSrc: Oral Oral Oral Oral  SpO2: 100% 98% 100% 91%  Weight:   80.6 kg   Height:        Intake/Output Summary (Last 24 hours) at 01/19/2023 1155 Last data filed at 01/19/2023 0600 Gross per 24 hour  Intake 630 ml  Output 625 ml  Net 5 ml    Filed Weights   01/16/23 0610 01/17/23 0500 01/19/23 0549  Weight: 85.4 kg 81.2 kg 80.6 kg    Examination:  General exam: NAD.  On 2 L nasal cannula. Respiratory system: Lungs clear to auscultation bilaterally.  No wheezes, no crackles, no rhonchi.  Fair air movement.  Speaking in full sentences.  Cardiovascular system: RRR no murmurs rubs or gallops.   No JVD.  Bilateral chronic venous stasis changes noted on lower extremities.  Gastrointestinal system: Abdomen is soft, nontender, nondistended, positive bowel sounds.  No rebound.  No guarding.   Central nervous system: Alert and oriented. No focal neurological deficits. Extremities: Bilateral chronic venous stasis changes noted.  Left lower extremity with Unna boot in place.  Thickened skin with hyperkeratosis of bilateral lower extremity. Skin: No rashes, lesions or ulcers Psychiatry: Judgement and insight appear normal. Mood & affect appropriate.     Data Reviewed: I have personally reviewed following labs and imaging studies  CBC: Recent Labs  Lab 01/15/23 1055 01/16/23 0047 01/17/23 0503 01/18/23 0541 01/19/23 0720  WBC 7.8 9.2 12.6* 10.9* 10.0  NEUTROABS 5.9  --  10.0* 8.9*  --   HGB 11.3* 10.8* 13.2 11.3* 11.1*  HCT 36.8 34.3* 43.0 37.0 35.6*  MCV 94.8 93.7 94.7 95.1 94.4  PLT 192 216 232 235 220     Basic Metabolic Panel: Recent Labs  Lab 01/15/23 1055 01/16/23 0047 01/17/23 0852 01/18/23 0541 01/19/23 0720  NA 137 136 140 139 136  K 3.7 3.8 4.2 3.8 3.7  CL 102 101 95*  97* 94*  CO2 24 26 27 26 26   GLUCOSE 154* 174* 157* 185* 169*  BUN 29* 27* 27* 37* 46*  CREATININE 2.20* 2.29* 2.02* 2.52* 2.86*  CALCIUM 8.7* 8.6* 9.3 8.7* 9.0  PHOS  --   --  4.0  --   --      GFR: Estimated Creatinine Clearance: 15.3 mL/min (A) (by C-G formula based on SCr of 2.86 mg/dL (H)).  Liver Function Tests: Recent Labs  Lab 01/15/23 1055 01/16/23 0047 01/17/23 0852 01/19/23 0720  AST 18 16  --  17  ALT 10 11  --  12  ALKPHOS 73 63  --  62  BILITOT 0.7 0.8  --  0.8  PROT 7.3 6.5  --  6.6  ALBUMIN 3.9 3.8 4.0 3.3*     CBG: Recent Labs  Lab 01/18/23 1114 01/18/23 1643 01/18/23 2108 01/19/23 0745 01/19/23 1123  GLUCAP 149* 191* 138* 167* 217*      Recent Results (from the past 240 hour(s))  Blood Cultures x 2 sites     Status: None (Preliminary result)    Collection Time: 01/15/23  1:38 PM   Specimen: BLOOD LEFT FOREARM  Result Value Ref Range Status   Specimen Description   Final    BLOOD LEFT FOREARM Performed at [facility], [place] [address] [place], [place] [address]    Special Requests   Final    BOTTLES DRAWN AEROBIC AND ANAEROBIC Blood Culture results may not be optimal due to an inadequate volume of blood received in culture bottles Performed at [facility] [address] [place], [place] [address]    Culture   Final    NO GROWTH 4 DAYS Performed at [facility], [place] [address], [place], [place] [address]    Report Status PENDING  Incomplete         Radiology Studies: No results found.      Scheduled Meds:  amiodarone  200 mg Oral Once per day on Mon Tue Wed Thu Fri Sat   atorvastatin  10 mg Oral Daily   cephALEXin  500 mg Oral Q8H   doxycycline  100 mg Oral Q12H   insulin aspart  0-9 Units Subcutaneous TID WC   levothyroxine  88 mcg Oral q morning   metoprolol succinate  25 mg Oral Daily   OLANZapine zydis  5 mg Oral BID   mouth rinse  15 mL Mouth Rinse 4 times per day   rivaroxaban  15 mg Oral Q supper   saccharomyces boulardii  250 mg Oral BID   [START ON 01/20/2023] torsemide  20 mg Oral BID   Continuous Infusions:     LOS: 4 days    Time spent: 35 minutes    Irine Seal, MD [facility]   To contact the attending provider between 7A-7P or the covering provider during after hours 7P-7A, please log into the web site [email or web address] and access using universal [place] password for that web site. If you do not have the password, please call the hospital operator.  01/19/2023, 11:55 AM

## 2023-01-20 DIAGNOSIS — I480 Paroxysmal atrial fibrillation: Secondary | ICD-10-CM | POA: Diagnosis not present

## 2023-01-20 DIAGNOSIS — I5033 Acute on chronic diastolic (congestive) heart failure: Secondary | ICD-10-CM

## 2023-01-20 DIAGNOSIS — M869 Osteomyelitis, unspecified: Secondary | ICD-10-CM | POA: Diagnosis not present

## 2023-01-20 DIAGNOSIS — J449 Chronic obstructive pulmonary disease, unspecified: Secondary | ICD-10-CM | POA: Diagnosis not present

## 2023-01-20 DIAGNOSIS — E039 Hypothyroidism, unspecified: Secondary | ICD-10-CM | POA: Diagnosis not present

## 2023-01-20 LAB — CBC
HCT: 37.1 % (ref 36.0–46.0)
Hemoglobin: 11.5 g/dL — ABNORMAL LOW (ref 12.0–15.0)
MCH: 29.3 pg (ref 26.0–34.0)
MCHC: 31 g/dL (ref 30.0–36.0)
MCV: 94.4 fL (ref 80.0–100.0)
Platelets: 243 10*3/uL (ref 150–400)
RBC: 3.93 MIL/uL (ref 3.87–5.11)
RDW: 13.4 % (ref 11.5–15.5)
WBC: 8.5 10*3/uL (ref 4.0–10.5)
nRBC: 0 % (ref 0.0–0.2)

## 2023-01-20 LAB — BASIC METABOLIC PANEL
Anion gap: 12 (ref 5–15)
BUN: 48 mg/dL — ABNORMAL HIGH (ref 8–23)
CO2: 27 mmol/L (ref 22–32)
Calcium: 8.8 mg/dL — ABNORMAL LOW (ref 8.9–10.3)
Chloride: 97 mmol/L — ABNORMAL LOW (ref 98–111)
Creatinine, Ser: 2.69 mg/dL — ABNORMAL HIGH (ref 0.44–1.00)
GFR, Estimated: 17 mL/min — ABNORMAL LOW (ref >60)
Glucose, Bld: 178 mg/dL — ABNORMAL HIGH (ref 70–99)
Potassium: 3.8 mmol/L (ref 3.5–5.1)
Sodium: 136 mmol/L (ref 135–145)

## 2023-01-20 LAB — GLUCOSE, CAPILLARY
Glucose-Capillary: 157 mg/dL — ABNORMAL HIGH (ref 70–99)
Glucose-Capillary: 201 mg/dL — ABNORMAL HIGH (ref 70–99)
Glucose-Capillary: 147 mg/dL — ABNORMAL HIGH (ref 70–99)
Glucose-Capillary: 250 mg/dL — ABNORMAL HIGH (ref 70–99)

## 2023-01-20 LAB — CULTURE, BLOOD (ROUTINE X 2): Culture: NO GROWTH

## 2023-01-20 MED ORDER — INSULIN ASPART 100 UNIT/ML IJ SOLN
2.0000 [IU] | Freq: Once | INTRAMUSCULAR | Status: AC
  Administered 2023-01-20: 2 [IU] via SUBCUTANEOUS

## 2023-01-20 MED ORDER — LIP MEDEX EX OINT: TOPICAL_OINTMENT | CUTANEOUS | Status: DC | PRN

## 2023-01-20 MED ORDER — TORSEMIDE 20 MG PO TABS
20.0000 mg | ORAL_TABLET | Freq: Two times a day (BID) | ORAL | Status: DC
  Filled 2023-01-20: qty 1

## 2023-01-20 MED ORDER — FUROSEMIDE 10 MG/ML IJ SOLN
40.0000 mg | Freq: Once | INTRAMUSCULAR | Status: AC
  Administered 2023-01-20: 40 mg via INTRAVENOUS
  Filled 2023-01-20: qty 4

## 2023-01-20 NOTE — TOC PASRR Note (Signed)
Transition of Care (TOC) -30 day Note       Patient Details  Name: Aleiza Hammitt MRN:  [id number] Date of Birth:  Oct 05, 2041   Transition of Care [facility]) CM/SW Contact  Name:  Lennart Pall, [place] Phone Number:  [phone] Date:  01/20/23 Time:  [id number]   MUST ID: [id number]   To Whom it May Concern:   Please be advised that the above patient will require a short-term nursing home stay, anticipated 30 days or less rehabilitation and strengthening. The plan is for return home.

## 2023-01-20 NOTE — Progress Notes (Signed)
PROGRESS NOTE    Lydia Myers  [id number] DOB: 1940-12-31 DOA: 01/15/2023 PCP: Collene Leyden, MD    Chief Complaint  Patient presents with   Leg Swelling    Brief Narrative:  Patient is a 82 year old female history of arthritis, COPD/asthma, type 2 diabetes, paroxysmal A-fib on Xarelto amiodarone CHF, CHB status post PPM, CKD stage IIIb-4, hypertension hypothyroidism chronic venous stasis changes, glaucoma presented to the ED at the urging of family due to worsening leg wound on the left lower extremity with malodorous.  It is noted that per chart caregiver stated wound has been progressive x 3 weeks.  Patient admitted, plain films done with concerns for possible osteomyelitis however patient unable to get MRI due to PPM.  Orthopedics consulted who recommended vascular surgery evaluation.   Assessment & Plan:   Principal Problem:   Osteomyelitis ([place]) Active Problems:   Paroxysmal atrial fibrillation (HCC)   Hypertension   Type 2 diabetes mellitus with stage 3b chronic kidney disease, with long-term current use of insulin (HCC)   Chronic diastolic CHF (congestive heart failure) ([place])   #1 left calcaneal bone/concern for osteomyelitis of the left calcaneal tuberosity -Patient presented with a 3-week history of worsening left lower extremity wound malodorous. -Plain films done concerning for possible osteomyelitis recommended MRI however MRI unable to be done due to history of PPM. -CRP noted at 0.6.  Lactic acid at 1.2. -Was on empiric IV vancomycin, IV cefepime. -Patient now transitioned to Keflex and doxycycline as of 01/18/2023. -Patient seen in consultation by orthopedics, who are recommending continuation of IV antibiotics and evaluation by vascular surgery who notes that patient had a previous ABI that was normal, patient noted to have a palpable left dorsalis pedis pulse, no signs of significant arterial insufficiency do not feel patient needs an arteriogram and does not  feel this is an ischemic ulcer.   -Per vascular surgery may be related to chronic venous insufficiency versus lymphedema.   -Vascular surgery notes patient has an appointment on 01/31/2023 with vascular surgery for venous reflux study and further workup and recommending wound care evaluate patient to place Unna boot to the left lower extremity with referral to outpatient wound care center for Unna boot changes.   -Per orthopedics and vascular surgery.   2.  COPD/asthma -Stable. -Continue nebs as needed.  3.  Paroxysmal atrial fibrillation -Continue amiodarone and Toprol-XL for rate control. -Xarelto initially held due to concern for possible procedure however no procedure planned at this time per vascular or per orthopedic surgery and as such Xarelto has been resumed.   4.  Diabetes mellitus type 2 -Hemoglobin A1c 6.7 (12/09/2019) -CBG noted at 157 this morning. -Hemoglobin A1c 8.8 (01/16/2023 ). -Continue to hold home regimen long-acting Levemir.   -SSI.    5.  Hypothyroidism -Synthroid.    6.  Acute on chronic diastolic CHF -Euvolemic clinically however patient with some hypoxia with sats of approximately 88% on room air per RN and 84% per PT. -Continue metoprolol succinate, Lipitor.  -BNP slightly elevated at 156.9.   -Patient received IV Lasix 01/19/2023 with urine output of 1.750 L over the past 24 hours.   -Patient is -3.828 L during this hospitalization.  -Hold torsemide today and give Lasix 40 mg IV x 1.    7.  CHB status post PPM -Stable.  8.  CKD stage IIIb-IV -Currently stable at baseline. -Continue home regimen metoprolol succinate.  Hold torsemide today. -Patient received a dose of Lasix 40 mg IV x 1  on 01/19/2023 with urine output of 1.750 L over the past 24 hours.  -Renal function stable and slowly trending down.  -Give a dose of IV Lasix today.  -Resume torsemide tomorrow. -Outpatient follow-up with nephrology.  9.  Glaucoma -Continue home regimen  eyedrops.  10.  Anxiety -Xanax as needed.  11.??  Sundowning -Patient with some agitation, irritability per RN early on during the hospitalization requiring IV Haldol.  -Continue Zyprexa 5 mg twice daily.  -Follow.  12.  Hypoxia -Per PT note patient noted to be hypoxic with sats of 84% on room air on 01/19/2023.. -Patient states has not been on oxygen prior to admission. -BNP noted to be slightly elevated at 156.9. -Repeat chest x-ray done 01/19/2023 with bibasilar atelectasis or infiltrates, increasing since prior study. -Patient given a dose of Lasix 40 mg IV x 1 on 01/19/2023 with urine output of 1.750 L over the past 24 hours.  Patient is -3.828 L during this hospitalization.   -O2 sats checked on room air at rest today at 86%.   -Lasix 40 mg IV x 1.   -Strict I's and O's.   -Check ambulatory sats in the AM.    DVT prophylaxis: Xarelto. Code Status: Full Family Communication: Updated patient.  No family at bedside.  Disposition: TBD  Status is: Inpatient Remains inpatient appropriate because: Severity of illness   Consultants:  Orthopedics: 01/16/2023 Wound care RN 01/16/2023 Vascular surgery: Dr. Carlis Abbott 01/16/2023  Procedures:  Chest x-ray 01/15/2023, 01/19/2023   Antimicrobials:  IV cefepime 01/15/2023>>>>> 01/18/2023 IV Zosyn 312 2024 x 1 dose IV vancomycin 01/15/2023>>>>> 01/18/2023 Keflex 01/18/2023>>>> Oral doxycycline 01/18/2023>>>>>>   Subjective: Sitting up in recliner.  Denies any significant shortness of breath.  Noted to have sats of 86% on room air per RN.  Denies any chest pain.  No abdominal pain.  Would prefer to go home then to a skilled nursing facility but states if that is needed she will try to be compliant.  Feels her strength is improving.   Objective: Vitals:   01/19/23 1216 01/19/23 2100 01/20/23 0500 01/20/23 0542  BP:  (!) 142/68  (!) 125/55  Pulse: 68 74  79  Resp:  (!) 21  15  Temp:  97.9 F (36.6 C)  97.8 F (36.6 C)  TempSrc:  Oral  Oral   SpO2: (!) 88% 94%  100%  Weight:   80.4 kg   Height:        Intake/Output Summary (Last 24 hours) at 01/20/2023 1121 Last data filed at 01/20/2023 0858 Gross per 24 hour  Intake 720 ml  Output 1750 ml  Net -1030 ml    Filed Weights   01/17/23 0500 01/19/23 0549 01/20/23 0500  Weight: 81.2 kg 80.6 kg 80.4 kg    Examination:  General exam: NAD.  On 2 L nasal cannula. Respiratory system: Some bibasilar crackles.  No wheezes, no crackles, no rhonchi.  Fair air movement.  Speaking in full sentences.  Cardiovascular system: Regular rate rhythm no murmurs rubs or gallops.  No JVD.  Bilateral chronic venous stasis changes noted in lower extremities.  Gastrointestinal system: Abdomen is soft, nontender, nondistended, positive bowel sounds.  No rebound.  No guarding.   Central nervous system: Alert and oriented. No focal neurological deficits. Extremities: Bilateral chronic venous stasis changes noted.  Left lower extremity with Unna boot in place.  Thickened skin with hyperkeratosis of bilateral lower extremity. Skin: No rashes, lesions or ulcers Psychiatry: Judgement and insight appear normal. Mood & affect  appropriate.     Data Reviewed: I have personally reviewed following labs and imaging studies  CBC: Recent Labs  Lab 01/15/23 1055 01/16/23 0047 01/17/23 0503 01/18/23 0541 01/19/23 0720 01/20/23 0506  WBC 7.8 9.2 12.6* 10.9* 10.0 8.5  NEUTROABS 5.9  --  10.0* 8.9*  --   --   HGB 11.3* 10.8* 13.2 11.3* 11.1* 11.5*  HCT 36.8 34.3* 43.0 37.0 35.6* 37.1  MCV 94.8 93.7 94.7 95.1 94.4 94.4  PLT 192 216 232 235 220 243     Basic Metabolic Panel: Recent Labs  Lab 01/16/23 0047 01/17/23 0852 01/18/23 0541 01/19/23 0720 01/20/23 0506  NA 136 140 139 136 136  K 3.8 4.2 3.8 3.7 3.8  CL 101 95* 97* 94* 97*  CO2 26 27 26 26 27   GLUCOSE 174* 157* 185* 169* 178*  BUN 27* 27* 37* 46* 48*  CREATININE 2.29* 2.02* 2.52* 2.86* 2.69*  CALCIUM 8.6* 9.3 8.7* 9.0 8.8*  PHOS  --   4.0  --   --   --      GFR: Estimated Creatinine Clearance: 16.3 mL/min (A) (by C-G formula based on SCr of 2.69 mg/dL (H)).  Liver Function Tests: Recent Labs  Lab 01/15/23 1055 01/16/23 0047 01/17/23 0852 01/19/23 0720  AST 18 16  --  17  ALT 10 11  --  12  ALKPHOS 73 63  --  62  BILITOT 0.7 0.8  --  0.8  PROT 7.3 6.5  --  6.6  ALBUMIN 3.9 3.8 4.0 3.3*     CBG: Recent Labs  Lab 01/19/23 0745 01/19/23 1123 01/19/23 1645 01/19/23 2057 01/20/23 0749  GLUCAP 167* 217* 169* 189* 157*      Recent Results (from the past 240 hour(s))  Blood Cultures x 2 sites     Status: None   Collection Time: 01/15/23  1:38 PM   Specimen: BLOOD LEFT FOREARM  Result Value Ref Range Status   Specimen Description   Final    BLOOD LEFT FOREARM Performed at [facility], [place] [address], [place], [place] [address]    Special Requests   Final    BOTTLES DRAWN AEROBIC AND ANAEROBIC Blood Culture results may not be optimal due to an inadequate volume of blood received in culture bottles Performed at [facility] [address], [place], [place] [address]    Culture   Final    NO GROWTH 5 DAYS Performed at [facility], [place] [address] [place], [place] [address]    Report Status 01/20/2023 FINAL  Final         Radiology Studies: DG Chest 2 View  Result Date: 01/19/2023 CLINICAL DATA:  Hypoxia EXAM: CHEST - 2 VIEW COMPARISON:  01/15/2023 FINDINGS: Left pacer remains in place, unchanged. Cardiomegaly. Aortic atherosclerosis. Increasing bibasilar opacities could reflect atelectasis or infiltrates. No visible effusions or pneumothorax. No acute bony abnormality. IMPRESSION: Cardiomegaly. Bibasilar atelectasis or infiltrates, increasing since prior study. Electronically Signed   By: Rolm Baptise M.D.   On: 01/19/2023 20:23        Scheduled Meds:  amiodarone  200 mg Oral Once per day on Mon Tue Wed Thu Fri Sat   atorvastatin  10 mg Oral Daily    cephALEXin  500 mg Oral Q8H   doxycycline  100 mg Oral Q12H   insulin aspart  0-9 Units Subcutaneous TID WC   levothyroxine  88 mcg Oral q morning   metoprolol succinate  25 mg  Oral Daily   OLANZapine zydis  5 mg Oral BID   mouth rinse  15 mL Mouth Rinse 4 times per day   rivaroxaban  15 mg Oral Q supper   saccharomyces boulardii  250 mg Oral BID   [START ON 01/21/2023] torsemide  20 mg Oral BID   Continuous Infusions:     LOS: 5 days    Time spent: 35 minutes    Irine Seal, MD [facility]   To contact the attending provider between 7A-7P or the covering provider during after hours 7P-7A, please log into the web site [email or web address] and access using universal [place] password for that web site. If you do not have the password, please call the hospital operator.  01/20/2023, 11:21 AM

## 2023-01-20 NOTE — NC FL2 (Addendum)
[place] LEVEL OF CARE FORM     IDENTIFICATION  Patient Name: Lydia Myers Birthdate: September 27, 1941 Sex: female Admission Date (Current Location): 01/15/2023  [facility] and [place] Number:  Herbalist and Address:  [facility],  [address] [place], [place]      Provider Number: [id number]  Attending Physician Name and Address:  Eugenie Filler, MD  Relative Name and Phone Number:  daughter, Clyde Canterbury [phone]    Current Level of Care: Hospital Recommended Level of Care: [place] Prior Approval Number:    Date Approved/Denied:   PASRR Number: pending  Discharge Plan: SNF    Current Diagnoses: Patient Active Problem List   Diagnosis Date Noted   Paroxysmal atrial fibrillation ([place]) 01/16/2023   Hypertension 01/16/2023   Type 2 diabetes mellitus with stage 3b chronic kidney disease, with long-term current use of insulin ([place]) 01/16/2023   Chronic diastolic CHF (congestive heart failure) ([place]) 01/16/2023   Osteomyelitis ([place]) 01/15/2023   Pain due to onychomycosis of toenails of both feet 04/10/2022   Affective psychosis ([place]) 09/06/2021   Anxiety 09/06/2021   Cardiomyopathy ([place]) 09/06/2021   Chronic diastolic heart failure ([place]) 09/06/2021   Chronic kidney disease, stage 3 unspecified ([place]) 09/06/2021   Chronic obstructive pulmonary disease ([place]) 09/06/2021   Constipation 09/06/2021   Diabetic renal disease ([place]) 09/06/2021   Hypercoagulable state ([place]) 09/06/2021   Hyperparathyroidism ([place]) 09/06/2021   Primary localized osteoarthritis of pelvic region and thigh 09/06/2021   Type 2 diabetes mellitus with diabetic nephropathy ([place]) 09/06/2021   Tachycardia-bradycardia syndrome ([place]) 03/16/2020   Pacemaker [id number]   Systolic dysfunction, left ventricle 12/12/2019   Acute renal failure superimposed on stage 2 chronic kidney disease ([place]) 12/12/2019   Hypothyroidism 07/04/2017   Dizziness  07/04/2017   Elevated d-dimer 07/04/2017   Klebsiella pneumoniae infection 07/04/2017   Atrial fibrillation with rapid ventricular response ([place]) 07/01/2017   Atrial fibrillation with RVR ([place]) 07/01/2017   Pulmonary edema cardiac cause ([place])    ATRIAL FLUTTER 11/09/2010   CHEST PAIN 11/09/2010   CARDIOMEGALY 11/08/2010   ASTHMA, UNSPECIFIED 11/10/2009   RESPIRATORY FAILURE, CHRONIC 11/10/2009   WEIGHT GAIN, ABNORMAL 11/10/2009   HYPERLIPIDEMIA 03/01/2008   Essential hypertension 03/01/2008   ALLERGIC RHINITIS 03/01/2008   SHORTNESS OF BREATH (SOB) 03/01/2008    Orientation RESPIRATION BLADDER Height & Weight     Self, Place  O2 Incontinent, External catheter (currently with purewick) Weight: 177 lb 4 oz (80.4 kg) Height:  5\' 4"  (162.6 cm)  BEHAVIORAL SYMPTOMS/MOOD NEUROLOGICAL BOWEL NUTRITION STATUS      Continent Diet (heart healthy)  AMBULATORY STATUS COMMUNICATION OF NEEDS Skin   Limited Assist Verbally Other (Comment) (Left posterior calf/ankle with full thickness wound, 100% grey moist nonviable skin, mod amt tan drainage, strong foul odor, 7X8X.1cm.     UNA BOOT TO LEFT LEG)                       Personal Care Assistance Level of Assistance  Bathing, Feeding, Dressing Bathing Assistance: Limited assistance Feeding assistance: Limited assistance Dressing Assistance: Limited assistance     Functional Limitations Info  Sight, Hearing, Speech Sight Info: Adequate Hearing Info: Adequate Speech Info: Adequate    SPECIAL CARE FACTORS FREQUENCY  PT (By licensed PT), OT (By licensed OT)     PT Frequency: 5x/wk OT Frequency: 5x/wk            Contractures Contractures Info: Not present  Additional Factors Info  Code Status, Allergies, Psychotropic, Insulin Sliding Scale Code Status Info: Full Allergies Info: NKDA Psychotropic Info: see MAR Insulin Sliding Scale Info: see MAR       Current Medications (01/20/2023):  This is the current hospital active  medication list Current Facility-Administered Medications  Medication Dose Route Frequency Provider Last Rate Last Admin   acetaminophen (TYLENOL) tablet 650 mg  650 mg Oral Q6H PRN Clance Boll, MD       Or   acetaminophen (TYLENOL) suppository 650 mg  650 mg Rectal Q6H PRN Clance Boll, MD       albuterol (PROVENTIL) (2.5 MG/3ML) 0.083% nebulizer solution 2.5 mg  2.5 mg Nebulization Q2H PRN Clance Boll, MD       ALPRAZolam Duanne Moron) tablet 0.25 mg  0.25 mg Oral Daily PRN Myles Rosenthal A, MD   0.25 mg at 01/19/23 0849   amiodarone (PACERONE) tablet 200 mg  200 mg Oral Once per day on Mon Tue Wed Thu Fri Sat Myles Rosenthal A, MD   200 mg at 01/19/23 0849   atorvastatin (LIPITOR) tablet 10 mg  10 mg Oral Daily Myles Rosenthal A, MD   10 mg at 01/20/23 0906   cephALEXin (KEFLEX) capsule 500 mg  500 mg Oral Q8H Eugenie Filler, MD   500 mg at 01/20/23 1414   doxycycline (VIBRA-TABS) tablet 100 mg  100 mg Oral Q12H Eugenie Filler, MD   100 mg at 01/20/23 [id number]   hydrALAZINE (APRESOLINE) injection 5 mg  5 mg Intravenous Q6H PRN Eugenie Filler, MD       insulin aspart (novoLOG) injection 0-9 Units  0-9 Units Subcutaneous TID WC Clance Boll, MD   3 Units at 01/20/23 1141   levothyroxine (SYNTHROID) tablet 88 mcg  88 mcg Oral q morning Clance Boll, MD   88 mcg at 01/20/23 0541   lip balm (CARMEX) ointment   Topical PRN Eugenie Filler, MD       metoprolol succinate (TOPROL-XL) 24 hr tablet 25 mg  25 mg Oral Daily Myles Rosenthal A, MD   25 mg at 01/20/23 0906   OLANZapine zydis (ZYPREXA) disintegrating tablet 5 mg  5 mg Oral BID Eugenie Filler, MD   5 mg at 01/20/23 0905   ondansetron (ZOFRAN) tablet 4 mg  4 mg Oral Q6H PRN Clance Boll, MD       Or   ondansetron [facility]) injection 4 mg  4 mg Intravenous Q6H PRN Clance Boll, MD       Oral care mouth rinse  15 mL Mouth Rinse 4 times per day Eugenie Filler, MD   15 mL at  01/20/23 1141   Oral care mouth rinse  15 mL Mouth Rinse PRN Eugenie Filler, MD       Oral care mouth rinse  15 mL Mouth Rinse PRN Donne Hazel, MD       Rivaroxaban Alveda Reasons) tablet 15 mg  15 mg Oral Q supper Eugenie Filler, MD   15 mg at 01/19/23 1654   saccharomyces boulardii (FLORASTOR) capsule 250 mg  250 mg Oral BID Eugenie Filler, MD   250 mg at 01/20/23 0906   [START ON 01/21/2023] torsemide (DEMADEX) tablet 20 mg  20 mg Oral BID Eugenie Filler, MD         Discharge Medications: Please see discharge summary for a list of discharge medications.  Relevant Imaging Results:  Relevant Lab  Results:   Additional Information SS# [id number]  Lennart Pall, LCSW

## 2023-01-20 NOTE — Progress Notes (Signed)
SATURATION QUALIFICATIONS: (This note is used to comply with regulatory documentation for home oxygen)  Patient Saturations on Room Air at Rest = 86%   

## 2023-01-20 NOTE — Progress Notes (Signed)
Occupational Therapy Treatment Patient Details Name: Lydia Myers MRN: [id number] DOB: [birth year] Today's Date: 01/20/2023   History of present illness Patient is a 82 year old female history of arthritis, COPD/asthma, type 2 diabetes, paroxysmal A-fib on Xarelto amiodarone CHF, CHB status post PPM, CKD stage IIIb-4, hypertension hypothyroidism chronic venous stasis changes, glaucoma presented to the ED at the urging of family due to worsening leg wound on the left lower extremity   OT comments  Patient participated in pill box activities today with patient having difficulty opening pill bottle, reading and processing label information and placing correct pills in box. Patient will need total A for medication management in next level of care. Patient would continue to benefit from skilled OT services at this time while admitted and after d/c to address noted deficits in order to improve overall safety and independence in ADLs.     Recommendations for follow up therapy are one component of a multi-disciplinary discharge planning process, led by the attending physician.  Recommendations may be updated based on patient status, additional functional criteria and insurance authorization.    Follow Up Recommendations  Skilled nursing-short term rehab (<3 hours/day)     Assistance Recommended at Discharge Intermittent Supervision/Assistance  Patient can return home with the following  A lot of help with bathing/dressing/bathroom;Assistance with cooking/housework;Direct supervision/assist for medications management;Assist for transportation;Direct supervision/assist for financial management   Equipment Recommendations  None recommended by OT       Precautions / Restrictions Precautions Precautions: Fall Precaution Comments: monitor sats, left leg wound, incontinent urine-on Lasix Restrictions Weight Bearing Restrictions: No       Mobility Bed Mobility               General bed  mobility comments: patient is in recliner and remained in the same    T       ADL either performed or assessed with clinical judgement   ADL Overall ADL's : Needs assistance/impaired           General ADL Comments: this session focused on medication management tasks. patient was provided with practice pill bottle and medication box. patient was able to read label aloud but unable to process what it ment. the label read " take 1 tablet 3x a day" patient then noted to take three pills out of the bottle. patient reported she would then take these pills if this was her typical medications. patient reported she would need 9 pills total for the day. patient was educated that medication read 1 pill 3x a day not 3 pills 3x a day. patient verbalized understanding. patient was able to problem solve through questions reguarding ordering more medications but noted to be fixated on "new" pharmacy charging her more money fore medications and her refusing them. patient was educated that medications were important and that if she does not have them to take then it could lead to coming back to the hospital. paitent verbalzied understanding reporting that she does not want to come back to the hosptial. strategies for being able to check medications were reviewed. patient was unable to fill pill box appropirately at this time. high risk of medication mismanagement without A at home. patient reported that her sister helps with picking up medications normally. patient is plesant and motivated but at times appears to have poor insight to deficits.      Cognition Arousal/Alertness: Awake/alert Behavior During Therapy: WFL for tasks assessed/performed Overall Cognitive Status: No family/caregiver present to determine baseline cognitive functioning  General Comments: patient was plesant and cooperative today. patient did report that her and NT did not get along today. nursing supervisor made aware.                    Pertinent Vitals/ Pain       Pain Assessment Pain Assessment: No/denies pain         Frequency  Min 2X/week        Progress Toward Goals  OT Goals(current goals can now be found in the care plan section)  Progress towards OT goals: OT to reassess next treatment     Plan Discharge plan remains appropriate       AM-PAC OT "6 Clicks" Daily Activity     Outcome Measure   Help from another person eating meals?: A Little Help from another person taking care of personal grooming?: A Little Help from another person toileting, which includes using toliet, bedpan, or urinal?: A Little Help from another person bathing (including washing, rinsing, drying)?: A Lot Help from another person to put on and taking off regular upper body clothing?: A Little Help from another person to put on and taking off regular lower body clothing?: A Lot 6 Click Score: 16    End of Session Equipment Utilized During Treatment: Other (comment) (pill box test)  OT Visit Diagnosis: Pain   Activity Tolerance Patient tolerated treatment well   Patient Left with call bell/phone within reach;in chair;with chair alarm set   Nurse Communication Other (comment) (ok to see patient)        Time: 1341-1406 OT Time Calculation (min): 25 min  Charges: OT General Charges $OT Visit: 1 Visit OT Treatments $Self Care/Home Management : 23-37 mins  Rennie Plowman, MS Acute Rehabilitation Department Office# [phone]   Willa Rough 01/20/2023, 3:19 PM

## 2023-01-20 NOTE — TOC Progression Note (Signed)
Transition of Care [facility]) - Progression Note    Patient Details  Name: Lydia Myers MRN: [id number] Date of Birth: [birth year]  [facility]) CM/SW Contact  Lennart Pall, LCSW Phone Number: 01/20/2023, 3:48 PM  Clinical Narrative:     Order placed for [facility] to now assist with SNF placement per therapy recommendations.  CSW met with pt who is aware of recommendation and quickly asks "how long would I be there?"  We discussed need for short term SNF until her mobility is more safe for her to be alone at home.  Pt is reluctantly agreeable and, also, states her sister could probably stay with her.   Have alerted pt's daughter as well who understands reasoning for SNF recommendation and agrees this is likely needed, however, wants to speak with pt as well.  CSW has begun SNF work up with [place] and awaiting PASRR.  Will ask oncoming TOC to follow up with pt and daughter tomorrow as well.   Expected Discharge Plan: [place] Barriers to Discharge: No Barriers Identified  Expected Discharge Plan and Services   Discharge Planning Services: CM Consult   Living arrangements for the past 2 months: Single Family Home                           HH Arranged: RN, PT [facility] Agency: [place] Date [place]: 01/18/23 Time [place]: 1119 Representative spoke with at [place]: [place] Determinants of Health ([place]) Interventions SDOH Screenings   Food Insecurity: No Food Insecurity (01/16/2023)  Housing: Low Risk  (01/16/2023)  Transportation Needs: No Transportation Needs (01/16/2023)  Utilities: Not At Risk (01/16/2023)  Tobacco Use: Low Risk  (01/15/2023)    Readmission Risk Interventions     No data to display

## 2023-01-20 NOTE — Plan of Care (Signed)
  Problem: Clinical Measurements: Goal: Ability to maintain clinical measurements within normal limits will improve Outcome: Not Progressing   Problem: Activity: Goal: Risk for activity intolerance will decrease Outcome: Not Progressing   

## 2023-01-21 DIAGNOSIS — M869 Osteomyelitis, unspecified: Secondary | ICD-10-CM | POA: Diagnosis not present

## 2023-01-21 DIAGNOSIS — E039 Hypothyroidism, unspecified: Secondary | ICD-10-CM | POA: Diagnosis not present

## 2023-01-21 DIAGNOSIS — J449 Chronic obstructive pulmonary disease, unspecified: Secondary | ICD-10-CM | POA: Diagnosis not present

## 2023-01-21 DIAGNOSIS — I480 Paroxysmal atrial fibrillation: Secondary | ICD-10-CM | POA: Diagnosis not present

## 2023-01-21 LAB — CBC WITH DIFFERENTIAL/PLATELET
Abs Immature Granulocytes: 0.06 10*3/uL (ref 0.00–0.07)
Basophils Absolute: 0.1 10*3/uL (ref 0.0–0.1)
Basophils Relative: 1 %
Eosinophils Absolute: 0.3 10*3/uL (ref 0.0–0.5)
Eosinophils Relative: 3 %
HCT: 35.4 % — ABNORMAL LOW (ref 36.0–46.0)
Hemoglobin: 10.8 g/dL — ABNORMAL LOW (ref 12.0–15.0)
Immature Granulocytes: 1 %
Lymphocytes Relative: 14 %
Lymphs Abs: 1.1 10*3/uL (ref 0.7–4.0)
MCH: 29 pg (ref 26.0–34.0)
MCHC: 30.5 g/dL (ref 30.0–36.0)
MCV: 95.2 fL (ref 80.0–100.0)
Monocytes Absolute: 1 10*3/uL (ref 0.1–1.0)
Monocytes Relative: 12 %
Neutro Abs: 5.4 10*3/uL (ref 1.7–7.7)
Neutrophils Relative %: 69 %
Platelets: 248 10*3/uL (ref 150–400)
RBC: 3.72 MIL/uL — ABNORMAL LOW (ref 3.87–5.11)
RDW: 13.5 % (ref 11.5–15.5)
WBC: 7.9 10*3/uL (ref 4.0–10.5)
nRBC: 0 % (ref 0.0–0.2)

## 2023-01-21 LAB — BASIC METABOLIC PANEL
Anion gap: 13 (ref 5–15)
BUN: 58 mg/dL — ABNORMAL HIGH (ref 8–23)
CO2: 26 mmol/L (ref 22–32)
Calcium: 8.9 mg/dL (ref 8.9–10.3)
Chloride: 97 mmol/L — ABNORMAL LOW (ref 98–111)
Creatinine, Ser: 2.99 mg/dL — ABNORMAL HIGH (ref 0.44–1.00)
GFR, Estimated: 15 mL/min — ABNORMAL LOW (ref >60)
Glucose, Bld: 170 mg/dL — ABNORMAL HIGH (ref 70–99)
Potassium: 3.8 mmol/L (ref 3.5–5.1)
Sodium: 136 mmol/L (ref 135–145)

## 2023-01-21 LAB — GLUCOSE, CAPILLARY
Glucose-Capillary: 172 mg/dL — ABNORMAL HIGH (ref 70–99)
Glucose-Capillary: 139 mg/dL — ABNORMAL HIGH (ref 70–99)
Glucose-Capillary: 182 mg/dL — ABNORMAL HIGH (ref 70–99)
Glucose-Capillary: 185 mg/dL — ABNORMAL HIGH (ref 70–99)

## 2023-01-21 MED ORDER — TORSEMIDE 20 MG PO TABS
20.0000 mg | ORAL_TABLET | Freq: Two times a day (BID) | ORAL | Status: DC
  Administered 2023-01-22: 20 mg via ORAL
  Filled 2023-01-21: qty 1

## 2023-01-21 NOTE — Care Management Important Message (Signed)
Important Message  Patient Details IM Letter given. Name: Lydia Myers MRN: [id number] Date of Birth: November 24, 1940   Medicare Important Message Given:  Yes     Kerin Salen 01/21/2023, 1:21 PM

## 2023-01-21 NOTE — TOC Progression Note (Signed)
Transition of Care [facility]) - Progression Note    Patient Details  Name: [name] MRN: [id number] Date of Birth: [birth year]  Transition of Care [facility]) CM/SW Contact  Jorrell Kuster, Juliann Pulse, RN Phone Number: 01/21/2023, 11:53 AM  Clinical Narrative:    [name] for [place] per angie(dtr)/dorethea(sis) Ileana Ladd [id number] Josem Kaufmann.   Expected Discharge Plan: Skilled Nursing Facility Barriers to Discharge: Insurance Authorization  Expected Discharge Plan and Services   Discharge Planning Services: CM Consult   Living arrangements for the past 2 months: [place]: RN, PT [facility] Agency: [place] Date [place]: 01/18/23 Time [place]: 1119 Representative spoke with at [place]: [place] Determinants of Health ([place]) Interventions SDOH Screenings   Food Insecurity: No Food Insecurity (01/16/2023)  Housing: Low Risk  (01/16/2023)  Transportation Needs: No Transportation Needs (01/16/2023)  Utilities: Not At Risk (01/16/2023)  Tobacco Use: Low Risk  (01/15/2023)    Readmission Risk Interventions     No data to display

## 2023-01-21 NOTE — TOC Progression Note (Signed)
Transition of Care [facility]) - Progression Note    Patient Details  Name: DANEAH MILLO MRN: [id number] Date of Birth: January 06, 1941  Transition of Care [facility]) CM/SW Contact  Tabbetha Kutscher, Juliann Pulse, RN Phone Number: 01/21/2023, 11:20 AM  Clinical Narrative: Sharia Reeve dtr Angie who also defers to patient's sis(Dorethea) on contact list-left vm for return call.Angie's/Dorethea's choice is  Helene Kelp Will also leave list in rm. Will start auth.     Expected Discharge Plan: Skilled Nursing Facility Barriers to Discharge: Continued Medical Work up  Expected Discharge Plan and Services   Discharge Planning Services: CM Consult   Living arrangements for the past 2 months: [place]: RN, PT [facility] Agency: [place] Date [place]: 01/18/23 Time [place]: 1119 Representative spoke with at [place]: [place] Determinants of Health ([place]) Interventions SDOH Screenings   Food Insecurity: No Food Insecurity (01/16/2023)  Housing: Low Risk  (01/16/2023)  Transportation Needs: No Transportation Needs (01/16/2023)  Utilities: Not At Risk (01/16/2023)  Tobacco Use: Low Risk  (01/15/2023)    Readmission Risk Interventions     No data to display

## 2023-01-21 NOTE — Plan of Care (Signed)
  Problem: Clinical Measurements: Goal: Diagnostic test results will improve Outcome: Progressing   Problem: Activity: Goal: Risk for activity intolerance will decrease Outcome: Progressing   Problem: Safety: Goal: Ability to remain free from injury will improve Outcome: Progressing   

## 2023-01-21 NOTE — Progress Notes (Signed)
PROGRESS NOTE    Lydia Myers  [id number] DOB: 1941/10/26 DOA: 01/15/2023 PCP: Collene Leyden, MD    Chief Complaint  Patient presents with   Leg Swelling    Brief Narrative:  Patient is a 82 year old female history of arthritis, COPD/asthma, type 2 diabetes, paroxysmal A-fib on Xarelto amiodarone CHF, CHB status post PPM, CKD stage IIIb-4, hypertension hypothyroidism chronic venous stasis changes, glaucoma presented to the ED at the urging of family due to worsening leg wound on the left lower extremity with malodorous.  It is noted that per chart caregiver stated wound has been progressive x 3 weeks.  Patient admitted, plain films done with concerns for possible osteomyelitis however patient unable to get MRI due to PPM.  Orthopedics consulted who recommended vascular surgery evaluation.   Assessment & Plan:   Principal Problem:   Osteomyelitis ([place]) Active Problems:   Paroxysmal atrial fibrillation (HCC)   Hypertension   Type 2 diabetes mellitus with stage 3b chronic kidney disease, with long-term current use of insulin (HCC)   Chronic diastolic CHF (congestive heart failure) ([place])   #1 left calcaneal bone/concern for osteomyelitis of the left calcaneal tuberosity -Patient presented with a 3-week history of worsening left lower extremity wound malodorous. -Plain films done concerning for possible osteomyelitis recommended MRI however MRI unable to be done due to history of PPM. -CRP noted at 0.6.  Lactic acid at 1.2. -Was on empiric IV vancomycin, IV cefepime. -Patient now transitioned to Keflex and doxycycline as of 01/18/2023. -Patient seen in consultation by orthopedics, who are recommending continuation of IV antibiotics and evaluation by vascular surgery who notes that patient had a previous ABI that was normal, patient noted to have a palpable left dorsalis pedis pulse, no signs of significant arterial insufficiency do not feel patient needs an arteriogram and does not  feel this is an ischemic ulcer.   -Per vascular surgery may be related to chronic venous insufficiency versus lymphedema.   -Vascular surgery notes patient has an appointment on 01/31/2023 with vascular surgery for venous reflux study and further workup and recommending wound care evaluate patient to place Unna boot to the left lower extremity with referral to outpatient wound care center for Unna boot changes.   -Per orthopedics and vascular surgery.   2.  COPD/asthma -Stable. -Continue nebs as needed.  3.  Paroxysmal atrial fibrillation -Continue amiodarone and Toprol-XL for rate control. -Xarelto initially held due to concern for possible procedure however no procedure planned at this time per vascular or per orthopedic surgery and as such Xarelto has been resumed.   4.  Diabetes mellitus type 2 -Hemoglobin A1c 6.7 (12/09/2019) -CBG noted at 172 this morning. -Hemoglobin A1c 8.8 (01/16/2023 ). -Continue to hold home regimen long-acting Levemir.   -SSI.    5.  Hypothyroidism -Continue Synthroid.   6.  Acute on chronic diastolic CHF -Euvolemic clinically however patient with some hypoxia with sats of approximately 88% on room air per RN and 84% per PT. -Continue metoprolol succinate, Lipitor.  -BNP slightly elevated at 156.9.   -Patient received IV Lasix 01/19/2023, on 01/20/2023 with good urine output.  -Patient is -3.8 L during this hospitalization.  -Patient appears euvolemic on examination. -Hold torsemide today.    7.  CHB status post PPM -Stable.  8.  CKD stage IIIb-IV -Currently stable at baseline. -Continue home regimen metoprolol succinate.  Hold torsemide today. -Patient received a dose of Lasix 40 mg IV x 1 on 01/19/2023, 01/20/2023.   -Slight bump in renal  function.   -Hold torsemide today.   -Outpatient follow-up with nephrology.   9.  Glaucoma -Continue home regimen eyedrops.  10.  Anxiety -Xanax as needed.   11.??  Sundowning -Patient with some agitation,  irritability per RN early on during the hospitalization requiring IV Haldol.  -Continue Zyprexa 5 mg twice daily.  -Follow.  12.  Hypoxia -Per PT note patient noted to be hypoxic with sats of 84% on room air on 01/19/2023.. -Patient states has not been on oxygen prior to admission. -BNP noted to be slightly elevated at 156.9. -Repeat chest x-ray done 01/19/2023 with bibasilar atelectasis or infiltrates, increasing since prior study. -Patient given a dose of Lasix 40 mg IV x 1 on 01/19/2023 with urine output of 1.750 L over the past 24 hours. -Lasix 40 mg IV 01/20/2023 with a urine output of 800 cc over the past 24 hours.  -Patient currently looks euvolemic.  -Ambulatory sats check and patient will need home O2 on discharge.     DVT prophylaxis: Xarelto. Code Status: Full Family Communication: Updated patient.  No family at bedside.  Disposition: SNF when bed available hopefully in the next 24 hours.  Status is: Inpatient Remains inpatient appropriate because: Severity of illness   Consultants:  Orthopedics: 01/16/2023 Wound care RN 01/16/2023 Vascular surgery: Dr. Carlis Abbott 01/16/2023  Procedures:  Chest x-ray 01/15/2023, 01/19/2023   Antimicrobials:  IV cefepime 01/15/2023>>>>> 01/18/2023 IV Zosyn 312 2024 x 1 dose IV vancomycin 01/15/2023>>>>> 01/18/2023 Keflex 01/18/2023>>>> Oral doxycycline 01/18/2023>>>>>>   Subjective: Sitting up in recliner, sleeping however easily arousable.  Denies any chest pain.  No shortness of breath.  No abdominal pain.  Feels lower extremity swelling has improved.   Objective: Vitals:   01/21/23 0322 01/21/23 0420 01/21/23 1115 01/21/23 1122  BP:    (!) 143/57  Pulse:   89 73  Resp:    16  Temp:  98 F (36.7 C)  (!) 97.4 F (36.3 C)  TempSrc:  Axillary  Oral  SpO2:   (!) 85% 96%  Weight: 82.3 kg     Height:        Intake/Output Summary (Last 24 hours) at 01/21/2023 1133 Last data filed at 01/21/2023 1030 Gross per 24 hour  Intake 840 ml   Output 900 ml  Net -60 ml    Filed Weights   01/19/23 0549 01/20/23 0500 01/21/23 0322  Weight: 80.6 kg 80.4 kg 82.3 kg    Examination:  General exam: NAD.  On 2 L nasal cannula. Respiratory system: Lungs clear to auscultation bilaterally.  No wheezes, no crackles, no rhonchi.  Fair air movement.  Speaking in full sentences.  No use of accessory muscles of respiration.  Cardiovascular system: RRR no murmurs rubs or gallops.  No JVD.  Bilateral chronic venous stasis changes noted in lower extremities.  Gastrointestinal system: Abdomen is soft, nontender, nondistended, positive bowel sounds.  No rebound.  No guarding.  Central nervous system: Alert and oriented. No focal neurological deficits. Extremities: Bilateral chronic venous stasis changes noted.  Left lower extremity with Unna boot in place.  Thickened skin with hyperkeratosis of bilateral lower extremity. Skin: No rashes, lesions or ulcers Psychiatry: Judgement and insight appear normal. Mood & affect appropriate.     Data Reviewed: I have personally reviewed following labs and imaging studies  CBC: Recent Labs  Lab 01/15/23 1055 01/16/23 0047 01/17/23 0503 01/18/23 0541 01/19/23 0720 01/20/23 0506 01/21/23 0457  WBC 7.8   < > 12.6* 10.9* 10.0 8.5 7.9  NEUTROABS 5.9  --  10.0* 8.9*  --   --  5.4  HGB 11.3*   < > 13.2 11.3* 11.1* 11.5* 10.8*  HCT 36.8   < > 43.0 37.0 35.6* 37.1 35.4*  MCV 94.8   < > 94.7 95.1 94.4 94.4 95.2  PLT 192   < > 232 235 220 243 248   < > = values in this interval not displayed.     Basic Metabolic Panel: Recent Labs  Lab 01/17/23 0852 01/18/23 0541 01/19/23 0720 01/20/23 0506 01/21/23 0457  NA 140 139 136 136 136  K 4.2 3.8 3.7 3.8 3.8  CL 95* 97* 94* 97* 97*  CO2 27 26 26 27 26   GLUCOSE 157* 185* 169* 178* 170*  BUN 27* 37* 46* 48* 58*  CREATININE 2.02* 2.52* 2.86* 2.69* 2.99*  CALCIUM 9.3 8.7* 9.0 8.8* 8.9  PHOS 4.0  --   --   --   --      GFR: Estimated Creatinine  Clearance: 14.8 mL/min (A) (by C-G formula based on SCr of 2.99 mg/dL (H)).  Liver Function Tests: Recent Labs  Lab 01/15/23 1055 01/16/23 0047 01/17/23 0852 01/19/23 0720  AST 18 16  --  17  ALT 10 11  --  12  ALKPHOS 73 63  --  62  BILITOT 0.7 0.8  --  0.8  PROT 7.3 6.5  --  6.6  ALBUMIN 3.9 3.8 4.0 3.3*     CBG: Recent Labs  Lab 01/20/23 1125 01/20/23 1633 01/20/23 2113 01/21/23 0744 01/21/23 1129  GLUCAP 201* 147* 250* 172* 139*      Recent Results (from the past 240 hour(s))  Blood Cultures x 2 sites     Status: None   Collection Time: 01/15/23  1:38 PM   Specimen: BLOOD LEFT FOREARM  Result Value Ref Range Status   Specimen Description   Final    BLOOD LEFT FOREARM Performed at [facility], [place] [address], [place], [place] [address]    Special Requests   Final    BOTTLES DRAWN AEROBIC AND ANAEROBIC Blood Culture results may not be optimal due to an inadequate volume of blood received in culture bottles Performed at [facility] [address], [place], [place] [address]    Culture   Final    NO GROWTH 5 DAYS Performed at [facility], [place] [address], [place], [place] [address]    Report Status 01/20/2023 FINAL  Final         Radiology Studies: DG Chest 2 View  Result Date: 01/19/2023 CLINICAL DATA:  Hypoxia EXAM: CHEST - 2 VIEW COMPARISON:  01/15/2023 FINDINGS: Left pacer remains in place, unchanged. Cardiomegaly. Aortic atherosclerosis. Increasing bibasilar opacities could reflect atelectasis or infiltrates. No visible effusions or pneumothorax. No acute bony abnormality. IMPRESSION: Cardiomegaly. Bibasilar atelectasis or infiltrates, increasing since prior study. Electronically Signed   By: Rolm Baptise M.D.   On: 01/19/2023 20:23        Scheduled Meds:  amiodarone  200 mg Oral Once per day on Mon Tue Wed Thu Fri Sat   atorvastatin  10 mg Oral Daily   cephALEXin  500 mg Oral Q8H   doxycycline  100 mg Oral  Q12H   insulin aspart  0-9 Units Subcutaneous TID WC   levothyroxine  88 mcg Oral q morning   metoprolol succinate  25 mg Oral Daily   OLANZapine zydis  5 mg Oral BID   mouth rinse  15 mL Mouth Rinse 4 times per day   rivaroxaban  15 mg Oral Q supper   saccharomyces boulardii  250 mg Oral BID   [START ON 01/22/2023] torsemide  20 mg Oral BID   Continuous Infusions:     LOS: 6 days    Time spent: 35 minutes    Irine Seal, MD [facility]   To contact the attending provider between 7A-7P or the covering provider during after hours 7P-7A, please log into the web site [email or web address] and access using universal [place] password for that web site. If you do not have the password, please call the hospital operator.  01/21/2023, 11:33 AM

## 2023-01-21 NOTE — Progress Notes (Signed)
Physical Therapy Treatment Patient Details Name: Lydia Myers MRN: [id number] DOB: Jun 28, 1941 Today's Date: 01/21/2023   History of Present Illness Patient is a 82 year old female history of arthritis, COPD/asthma, type 2 diabetes, paroxysmal A-fib on Xarelto amiodarone CHF, CHB status post PPM, CKD stage IIIb-4, hypertension hypothyroidism chronic venous stasis changes, glaucoma presented to the ED at the urging of family due to worsening leg wound on the left lower extremity    PT Comments    Pt progressing with PT, continues to fatigue with mobility; d/c plan remains appropriate. Continue PT POC   Recommendations for follow up therapy are one component of a multi-disciplinary discharge planning process, led by the attending physician.  Recommendations may be updated based on patient status, additional functional criteria and insurance authorization.  Follow Up Recommendations  Skilled nursing-short term rehab (<3 hours/day) Can patient physically be transported by private vehicle: Yes   Assistance Recommended at Discharge Frequent or constant Supervision/Assistance  Patient can return home with the following A little help with walking and/or transfers;A little help with bathing/dressing/bathroom;Help with stairs or ramp for entrance;Assist for transportation;Direct supervision/assist for medications management;Assistance with cooking/housework   Equipment Recommendations  Rolling walker (2 wheels);BSC/3in1    Recommendations for Other Services       Precautions / Restrictions Precautions Precautions: Fall Precaution Comments: monitor sats, left leg wound, incontinent urine-on Lasix Restrictions Weight Bearing Restrictions: No     Mobility  Bed Mobility               General bed mobility comments: in recliner    Transfers Overall transfer level: Needs assistance Equipment used: Rolling walker (2 wheels) Transfers: Sit to/from Stand Sit to Stand: Min guard, Min  assist           General transfer comment: cues for hand placement, uses momentum to stand    Ambulation/Gait Ambulation/Gait assistance: Min guard Gait Distance (Feet): 90 Feet (10') Assistive device: Rolling walker (2 wheels) Gait Pattern/deviations: Step-through pattern, Trunk flexed       General Gait Details: cues for posture RW position. 3 standing rests d/t fatigue/dyspnea; pt on 2L O2 throughout with sats 99-100%   Stairs             Wheelchair Mobility    Modified Rankin (Stroke Patients Only)       Balance Overall balance assessment: Needs assistance Sitting-balance support: Feet supported, No upper extremity supported Sitting balance-Leahy Scale: Good     Standing balance support: During functional activity, Single extremity supported Standing balance-Leahy Scale: Fair                              Cognition Arousal/Alertness: Awake/alert Behavior During Therapy: WFL for tasks assessed/performed Overall Cognitive Status: Within Functional Limits for tasks assessed                                          Exercises      General Comments        Pertinent Vitals/Pain Pain Assessment Pain Assessment: No/denies pain    Home Living                          Prior Function            PT Goals (current goals can now be found in the care  plan section) Acute Rehab PT Goals Patient Stated Goal: to go home PT Goal Formulation: With patient Time For Goal Achievement: 02/01/23 Potential to Achieve Goals: Good Progress towards PT goals: Progressing toward goals    Frequency    Min 3X/week      PT Plan Discharge plan needs to be updated    Co-evaluation              AM-PAC PT "6 Clicks" Mobility   Outcome Measure  Help needed turning from your back to your side while in a flat bed without using bedrails?: A Little Help needed moving from lying on your back to sitting on the side of a  flat bed without using bedrails?: A Little Help needed moving to and from a bed to a chair (including a wheelchair)?: A Little Help needed standing up from a chair using your arms (e.g., wheelchair or bedside chair)?: A Little Help needed to walk in hospital room?: A Little Help needed climbing 3-5 steps with a railing? : A Little 6 Click Score: 18    End of Session Equipment Utilized During Treatment: Gait belt;Oxygen Activity Tolerance: Patient tolerated treatment well Patient left: in chair;with call bell/phone within reach;with chair alarm set Nurse Communication: Mobility status PT Visit Diagnosis: Unsteadiness on feet (R26.81);Difficulty in walking, not elsewhere classified (R26.2)     Time: [id number] PT Time Calculation (min) (ACUTE ONLY): 18 min  Charges:  $Gait Training: 8-22 mins                     Baxter Flattery, PT  Acute Rehab Dept [facility]) [phone]  [facility] Weekend Pager [facility] only)  [phone]  01/21/2023    [facility] 01/21/2023, 4:11 PM

## 2023-01-22 DIAGNOSIS — E039 Hypothyroidism, unspecified: Secondary | ICD-10-CM | POA: Diagnosis not present

## 2023-01-22 DIAGNOSIS — I480 Paroxysmal atrial fibrillation: Secondary | ICD-10-CM | POA: Diagnosis not present

## 2023-01-22 DIAGNOSIS — J449 Chronic obstructive pulmonary disease, unspecified: Secondary | ICD-10-CM | POA: Diagnosis not present

## 2023-01-22 DIAGNOSIS — M869 Osteomyelitis, unspecified: Secondary | ICD-10-CM | POA: Diagnosis not present

## 2023-01-22 LAB — BASIC METABOLIC PANEL
Anion gap: 13 (ref 5–15)
BUN: 61 mg/dL — ABNORMAL HIGH (ref 8–23)
CO2: 27 mmol/L (ref 22–32)
Calcium: 9 mg/dL (ref 8.9–10.3)
Chloride: 96 mmol/L — ABNORMAL LOW (ref 98–111)
Creatinine, Ser: 2.7 mg/dL — ABNORMAL HIGH (ref 0.44–1.00)
GFR, Estimated: 17 mL/min — ABNORMAL LOW (ref >60)
Glucose, Bld: 197 mg/dL — ABNORMAL HIGH (ref 70–99)
Potassium: 4 mmol/L (ref 3.5–5.1)
Sodium: 136 mmol/L (ref 135–145)

## 2023-01-22 LAB — GLUCOSE, CAPILLARY
Glucose-Capillary: 187 mg/dL — ABNORMAL HIGH (ref 70–99)
Glucose-Capillary: 206 mg/dL — ABNORMAL HIGH (ref 70–99)
Glucose-Capillary: 339 mg/dL — ABNORMAL HIGH (ref 70–99)
Glucose-Capillary: 149 mg/dL — ABNORMAL HIGH (ref 70–99)

## 2023-01-22 LAB — CULTURE, BLOOD (ROUTINE X 2)
Culture: NO GROWTH
Special Requests: ADEQUATE

## 2023-01-22 MED ORDER — ZOLPIDEM TARTRATE 5 MG PO TABS
5.0000 mg | ORAL_TABLET | Freq: Every evening | ORAL | Status: DC | PRN
  Administered 2023-01-22: 5 mg via ORAL
  Filled 2023-01-22: qty 1

## 2023-01-22 MED ORDER — ALPRAZOLAM 0.25 MG PO TABS
0.2500 mg | ORAL_TABLET | Freq: Once | ORAL | Status: AC | PRN
  Administered 2023-01-22: 0.25 mg via ORAL
  Filled 2023-01-22: qty 1

## 2023-01-22 MED ORDER — NEPRO/CARBSTEADY PO LIQD: 237.0000 mL | Freq: Three times a day (TID) | ORAL | Status: DC | PRN

## 2023-01-22 MED ORDER — TORSEMIDE 20 MG PO TABS
20.0000 mg | ORAL_TABLET | Freq: Two times a day (BID) | ORAL | Status: DC
  Administered 2023-01-23 (×2): 20 mg via ORAL
  Filled 2023-01-22 (×4): qty 1

## 2023-01-22 MED ORDER — INSULIN DETEMIR 100 UNIT/ML ~~LOC~~ SOLN
5.0000 [IU] | Freq: Every day | SUBCUTANEOUS | Status: DC
  Administered 2023-01-22 – 2023-01-23 (×2): 5 [IU] via SUBCUTANEOUS
  Filled 2023-01-22 (×2): qty 0.05

## 2023-01-22 MED ORDER — SENNOSIDES-DOCUSATE SODIUM 8.6-50 MG PO TABS
1.0000 | ORAL_TABLET | Freq: Two times a day (BID) | ORAL | Status: DC
  Administered 2023-01-22 – 2023-01-23 (×3): 1 via ORAL
  Filled 2023-01-22 (×3): qty 1

## 2023-01-22 MED ORDER — HYDROXYZINE HCL 25 MG PO TABS: 25.0000 mg | ORAL_TABLET | Freq: Three times a day (TID) | ORAL | Status: DC | PRN

## 2023-01-22 MED ORDER — CAMPHOR-MENTHOL 0.5-0.5 % EX LOTN: 1.0000 | TOPICAL_LOTION | Freq: Three times a day (TID) | CUTANEOUS | Status: DC | PRN

## 2023-01-22 MED ORDER — SORBITOL 70 % SOLN: 30.0000 mL | Status: DC | PRN

## 2023-01-22 MED ORDER — BISACODYL 10 MG RE SUPP
10.0000 mg | Freq: Once | RECTAL | Status: AC
  Administered 2023-01-22: 10 mg via RECTAL
  Filled 2023-01-22: qty 1

## 2023-01-22 MED ORDER — CALCIUM CARBONATE ANTACID 1250 MG/5ML PO SUSP: 500.0000 mg | Freq: Four times a day (QID) | ORAL | Status: DC | PRN

## 2023-01-22 MED ORDER — DOCUSATE SODIUM 283 MG RE ENEM: 1.0000 | ENEMA | RECTAL | Status: DC | PRN

## 2023-01-22 NOTE — Inpatient Diabetes Management (Signed)
Inpatient Diabetes Program Recommendations  AACE/ADA: New Consensus Statement on Inpatient Glycemic Control (2015)  Target Ranges:  Prepandial:   less than 140 mg/dL      Peak postprandial:   less than 180 mg/dL (1-2 hours)      Critically ill patients:  140 - 180 mg/dL   Lab Results  Component Value Date   GLUCAP 185 (H) 01/21/2023   HGBA1C 8.8 (H) 01/16/2023    Review of Glycemic Control  Diabetes history: DM2 Outpatient Diabetes medications: Levemir 11 units QD, Januvia 25 mg QD Current orders for Inpatient glycemic control: Novolog 0-9 units TID  HgbA1C 8.8%     CBGs 197 this am Hypos yesterday  Inpatient Diabetes Program Recommendations:    Consider adding Levemir 5 units QD  Follow glucose trends. Need tight glycemic control for healing.  Thank you. Lorenda Peck, RD, LDN, [place] Inpatient Diabetes Coordinator [phone]

## 2023-01-22 NOTE — Progress Notes (Signed)
Patient has increased agitation. Attempting to leave her chair without assistance and without her O2.   Not following commands.   Primary RN at bedside.   Attempted to reach family without success.

## 2023-01-22 NOTE — TOC Progression Note (Signed)
Transition of Care [facility]) - Progression Note    Patient Details  Name: Lydia Myers MRN: [id number] Date of Birth: 01/01/1941  [facility] of [facility]) CM/SW Contact  Deaven Barron, Juliann Pulse, RN Phone Number: 01/22/2023, 1:42 PM  Clinical Narrative:  Aida Puffer for [facility] eff 3/18-3/20. Per attending not stable for d/c today. Likely d/c in am. Continue to monitor.     Expected Discharge Plan: [place] Barriers to Discharge: Continued Medical Work up  Expected Discharge Plan and Services   Discharge Planning Services: CM Consult   Living arrangements for the past 2 months: Single Family Home                           Social Determinants of Health (SDOH) Interventions SDOH Screenings   Food Insecurity: No Food Insecurity (01/16/2023)  Housing: Low Risk  (01/16/2023)  Transportation Needs: No Transportation Needs (01/16/2023)  Utilities: Not At Risk (01/16/2023)  Tobacco Use: Low Risk  (01/15/2023)    Readmission Risk Interventions     No data to display

## 2023-01-22 NOTE — Progress Notes (Addendum)
Mobility Specialist - Progress Note   01/22/23 1110  Mobility  Activity Ambulated with assistance in hallway;Ambulated with assistance to bathroom  Level of Assistance Standby assist, set-up cues, supervision of patient - no hands on  Assistive Device Front wheel walker  Distance Ambulated (ft) 250 ft  Activity Response Tolerated well  Mobility Referral Yes  $Mobility charge 1 Mobility   Nurse requested Mobility Specialist to perform oxygen saturation test with pt which includes removing pt from oxygen both at rest and while ambulating.  Below are the results from that testing.     Patient Saturations on Room Air at Rest = spO2 88%  Patient Saturations on Room Air while Ambulating = sp02 88% .  Rested and performed pursed lip breathing for 1 minute with sp02 at 89%.  Patient Saturations on 2 Liters of oxygen while Ambulating = sp02 90%  At end of testing pt left in room on 2  Liters of oxygen.  Reported results to nurse.    Pt received EOB and agreeable to mobility. Pt took 1x (~30sec) standing rest break. Encouraged pursed lip breaths throughout ambulation. Pt mentioned that she tends to get SOB when she's up & walking compared to sitting. Upon returning to room, pt requested assistance to bathroom. No complaints during session. Pt to recliner after session with all needs met.    Pre-mobility: 70 HR, 88% SpO2 (RA) During mobility: 79 HR, 90% SpO2 (2L [place]) Post-mobility: 81 HR, 93% SPO2 (2L [place])  Set designer

## 2023-01-22 NOTE — Progress Notes (Signed)
PROGRESS NOTE    Lydia Myers  [id number] DOB: 1941-06-18 DOA: 01/15/2023 PCP: Collene Leyden, MD    Chief Complaint  Patient presents with   Leg Swelling    Brief Narrative:  Patient is a 82 year old female history of arthritis, COPD/asthma, type 2 diabetes, paroxysmal A-fib on Xarelto amiodarone CHF, CHB status post PPM, CKD stage IIIb-4, hypertension hypothyroidism chronic venous stasis changes, glaucoma presented to the ED at the urging of family due to worsening leg wound on the left lower extremity with malodorous.  It is noted that per chart caregiver stated wound has been progressive x 3 weeks.  Patient admitted, plain films done with concerns for possible osteomyelitis however patient unable to get MRI due to PPM.  Orthopedics consulted who recommended vascular surgery evaluation.   Assessment & Plan:   Principal Problem:   Osteomyelitis ([place]) Active Problems:   Paroxysmal atrial fibrillation (HCC)   Hypertension   Type 2 diabetes mellitus with stage 3b chronic kidney disease, with long-term current use of insulin (HCC)   Chronic diastolic CHF (congestive heart failure) ([place])   #1 left calcaneal bone/concern for osteomyelitis of the left calcaneal tuberosity -Patient presented with a 3-week history of worsening left lower extremity wound malodorous. -Plain films done concerning for possible osteomyelitis recommended MRI however MRI unable to be done due to history of PPM. -CRP noted at 0.6.  Lactic acid at 1.2. -Was on empiric IV vancomycin, IV cefepime. -Patient now transitioned to Keflex and doxycycline as of 01/18/2023. -Patient seen in consultation by orthopedics, who are recommending continuation of IV antibiotics and evaluation by vascular surgery who notes that patient had a previous ABI that was normal, patient noted to have a palpable left dorsalis pedis pulse, no signs of significant arterial insufficiency do not feel patient needs an arteriogram and does not  feel this is an ischemic ulcer.   -Per vascular surgery may be related to chronic venous insufficiency versus lymphedema.   -Vascular surgery notes patient has an appointment on 01/31/2023 with vascular surgery for venous reflux study and further workup and recommending wound care evaluate patient to place Unna boot to the left lower extremity with referral to outpatient wound care center for Unna boot changes.   -Per orthopedics and vascular surgery.   2.  COPD/asthma -Stable. -Continue nebs as needed.  3.  Paroxysmal atrial fibrillation -Continue Toprol-XL, amiodarone for rate control.   -Xarelto initially held due to concern for possible procedure however resumed after no procedures planned.   4.  Diabetes mellitus type 2 -Hemoglobin A1c 6.7 (12/09/2019) -CBG 187 this morning.  -Hemoglobin A1c 8.8 (01/16/2023 ). -Will start Levemir 5 units daily and uptitrate as needed for better blood glucose control.   -On discharge Levemir will need to be changed to [facility] as per pharmacy Levemir will no longer be available in the [place] starting in April. -SSI.    5.  Hypothyroidism -Synthroid.  6.  Acute on chronic diastolic CHF -Euvolemic clinically however patient with some hypoxia with sats of approximately 88% on room air per RN and 84% per PT. -Continue metoprolol succinate, Lipitor.  -BNP slightly elevated at 156.9.   -Patient received IV Lasix 01/19/2023, on 01/20/2023 with good urine output.  -Patient is -3.7 L during this hospitalization.  -Patient appears euvolemic on examination. -Hold torsemide today.    7.  CHB status post PPM -Stable.  8.  CKD stage IIIb-IV -Currently stable at baseline. -Continue home regimen metoprolol succinate.  Hold torsemide today. -Patient received a  dose of Lasix 40 mg IV x 1 on 01/19/2023, 01/20/2023.   -Slight bump in renal function.   -Renal function trending back down with holding torsemide.   -Hold torsemide for another 24 hours and resume tomorrow.    -Outpatient follow-up with nephrology.   9.  Glaucoma -Continue home regimen eyedrops.  10.  Anxiety -Xanax as needed.   11.??  Sundowning -Patient with some agitation, irritability per RN early on during the hospitalization requiring IV Haldol.  -Patient started on Zyprexa 5 mg twice daily with improvement.   -Could likely change Zyprexa to as needed on discharge.  -Follow.  12.  Hypoxia -Per PT note patient noted to be hypoxic with sats of 84% on room air on 01/19/2023.. -Patient states has not been on oxygen prior to admission. -BNP noted to be slightly elevated at 156.9. -Repeat chest x-ray done 01/19/2023 with bibasilar atelectasis or infiltrates, increasing since prior study. -Patient given a dose of Lasix 40 mg IV x 1 on 01/19/2023 with urine output of 1.750 L. -Lasix 40 mg IV 01/20/2023 with a urine output of 800 cc.  -Patient currently looks euvolemic.  -Ambulatory sats check and patient will need home O2 on discharge. -  13.  Constipation -Patient with complaints of constipation per RN. -Oral sorbitol, Dulcolax suppository ordered however RN had to perform some disimpaction. -Soapsuds enema ordered with significant large bowel movement per RN. -Continue Senokot S twice daily.    DVT prophylaxis: Xarelto. Code Status: Full Family Communication: Updated patient.  No family at bedside.  Disposition: SNF when bed available.  Status is: Inpatient Remains inpatient appropriate because: Severity of illness   Consultants:  Orthopedics: 01/16/2023 Wound care RN 01/16/2023 Vascular surgery: Dr. Carlis Abbott 01/16/2023  Procedures:  Chest x-ray 01/15/2023, 01/19/2023   Antimicrobials:  IV cefepime 01/15/2023>>>>> 01/18/2023 IV Zosyn 312 2024 x 1 dose IV vancomycin 01/15/2023>>>>> 01/18/2023 Keflex 01/18/2023>>>> Oral doxycycline 01/18/2023>>>>>>   Subjective: Patient sitting up in chair, feels a little confused.  Initially states she feels lost.  Patient alert and oriented to  self and place. Knows it is 2024.  Thinks it is June or July however knows the president is [place].  Patient with complaints of constipation per RN.  Objective: Vitals:   01/21/23 1332 01/21/23 1951 01/22/23 0532 01/22/23 0745  BP: (!) 132/94 (!) 153/61 (!) 141/53   Pulse: 69 64 72   Resp: 18 19 20    Temp: 98.2 F (36.8 C) 97.7 F (36.5 C) 98.9 F (37.2 C)   TempSrc:  Oral Oral   SpO2: 99% 98% 99%   Weight:    80.4 kg  Height:        Intake/Output Summary (Last 24 hours) at 01/22/2023 1005 Last data filed at 01/22/2023 0900 Gross per 24 hour  Intake 600 ml  Output 250 ml  Net 350 ml    Filed Weights   01/20/23 0500 01/21/23 0322 01/22/23 0745  Weight: 80.4 kg 82.3 kg 80.4 kg    Examination:  General exam: NAD.  On 2 L nasal cannula. Respiratory system: CTAB.  No wheezes, no crackles, no rhonchi.  Fair air movement.  Speaking in full sentences.  No use of accessory muscles of respiration.   Cardiovascular system: Regular rate rhythm no murmurs rubs or gallops.  No JVD.  Bilateral chronic venous stasis changes noted lower extremities. Gastrointestinal system: Abdomen is soft, nontender, nondistended, positive bowel sounds.  No rebound.  No guarding.   Central nervous system: Alert and oriented. No focal neurological  deficits. Extremities: Bilateral chronic venous stasis changes noted.  Left lower extremity with Unna boot in place.  Thickened skin with hyperkeratosis of bilateral lower extremity. Skin: No rashes, lesions or ulcers Psychiatry: Judgement and insight appear normal. Mood & affect appropriate.     Data Reviewed: I have personally reviewed following labs and imaging studies  CBC: Recent Labs  Lab 01/15/23 1055 01/16/23 0047 01/17/23 0503 01/18/23 0541 01/19/23 0720 01/20/23 0506 01/21/23 0457  WBC 7.8   < > 12.6* 10.9* 10.0 8.5 7.9  NEUTROABS 5.9  --  10.0* 8.9*  --   --  5.4  HGB 11.3*   < > 13.2 11.3* 11.1* 11.5* 10.8*  HCT 36.8   < > 43.0 37.0 35.6*  37.1 35.4*  MCV 94.8   < > 94.7 95.1 94.4 94.4 95.2  PLT 192   < > 232 235 220 243 248   < > = values in this interval not displayed.     Basic Metabolic Panel: Recent Labs  Lab 01/17/23 0852 01/18/23 0541 01/19/23 0720 01/20/23 0506 01/21/23 0457 01/22/23 0525  NA 140 139 136 136 136 136  K 4.2 3.8 3.7 3.8 3.8 4.0  CL 95* 97* 94* 97* 97* 96*  CO2 27 26 26 27 26 27   GLUCOSE 157* 185* 169* 178* 170* 197*  BUN 27* 37* 46* 48* 58* 61*  CREATININE 2.02* 2.52* 2.86* 2.69* 2.99* 2.70*  CALCIUM 9.3 8.7* 9.0 8.8* 8.9 9.0  PHOS 4.0  --   --   --   --   --      GFR: Estimated Creatinine Clearance: 16.2 mL/min (A) (by C-G formula based on SCr of 2.7 mg/dL (H)).  Liver Function Tests: Recent Labs  Lab 01/15/23 1055 01/16/23 0047 01/17/23 0852 01/19/23 0720  AST 18 16  --  17  ALT 10 11  --  12  ALKPHOS 73 63  --  62  BILITOT 0.7 0.8  --  0.8  PROT 7.3 6.5  --  6.6  ALBUMIN 3.9 3.8 4.0 3.3*     CBG: Recent Labs  Lab 01/21/23 0744 01/21/23 1129 01/21/23 1722 01/21/23 1955 01/22/23 0732  GLUCAP 172* 139* 182* 185* 187*      Recent Results (from the past 240 hour(s))  Blood Cultures x 2 sites     Status: None   Collection Time: 01/15/23  1:38 PM   Specimen: BLOOD LEFT FOREARM  Result Value Ref Range Status   Specimen Description   Final    BLOOD LEFT FOREARM Performed at [facility], [place] [address], [place], [place] [address]    Special Requests   Final    BOTTLES DRAWN AEROBIC AND ANAEROBIC Blood Culture results may not be optimal due to an inadequate volume of blood received in culture bottles Performed at [place] [address] [place], [place] [address]    Culture   Final    NO GROWTH 5 DAYS Performed at [facility], [place] [address] [place], [place] [address]    Report Status 01/20/2023 FINAL  Final         Radiology Studies: No results found.      Scheduled Meds:  amiodarone  200 mg Oral Once per  day on Mon Tue Wed Thu Fri Sat   atorvastatin  10 mg Oral Daily   bisacodyl  10 mg Rectal Once   cephALEXin  500 mg Oral Q8H   doxycycline  100 mg Oral Q12H  insulin aspart  0-9 Units Subcutaneous TID WC   insulin detemir  5 Units Subcutaneous Daily   levothyroxine  88 mcg Oral q morning   metoprolol succinate  25 mg Oral Daily   OLANZapine zydis  5 mg Oral BID   mouth rinse  15 mL Mouth Rinse 4 times per day   rivaroxaban  15 mg Oral Q supper   saccharomyces boulardii  250 mg Oral BID   senna-docusate  1 tablet Oral BID   [START ON 01/23/2023] torsemide  20 mg Oral BID   Continuous Infusions:     LOS: 7 days    Time spent: 35 minutes    Irine Seal, MD [facility]   To contact the attending provider between 7A-7P or the covering provider during after hours 7P-7A, please log into the web site [email or web address] and access using universal [place] password for that web site. If you do not have the password, please call the hospital operator.  01/22/2023, 10:05 AM

## 2023-01-22 NOTE — Plan of Care (Signed)
  Problem: Health Behavior/Discharge Planning: Goal: Ability to manage health-related needs will improve Outcome: Progressing   Problem: Clinical Measurements: Goal: Ability to maintain clinical measurements within normal limits will improve Outcome: Progressing Goal: Will remain free from infection Outcome: Progressing Goal: Diagnostic test results will improve Outcome: Progressing Goal: Respiratory complications will improve Outcome: Progressing Goal: Cardiovascular complication will be avoided Outcome: Progressing   Problem: Coping: Goal: Level of anxiety will decrease Outcome: Progressing   Problem: Elimination: Goal: Will not experience complications related to bowel motility Outcome: Progressing   Problem: Education: Goal: Knowledge of General Education information will improve Description: Including pain rating scale, medication(s)/side effects and non-pharmacologic comfort measures Outcome: Adequate for Discharge   Problem: Activity: Goal: Risk for activity intolerance will decrease Outcome: Adequate for Discharge   Problem: Nutrition: Goal: Adequate nutrition will be maintained Outcome: Adequate for Discharge   Problem: Elimination: Goal: Will not experience complications related to urinary retention Outcome: Adequate for Discharge   Problem: Pain Managment: Goal: General experience of comfort will improve Outcome: Adequate for Discharge   Problem: Safety: Goal: Ability to remain free from injury will improve Outcome: Adequate for Discharge   Problem: Skin Integrity: Goal: Risk for impaired skin integrity will decrease Outcome: Adequate for Discharge   Problem: Safety: Goal: Non-violent Restraint(s) Outcome: Not Applicable

## 2023-01-23 ENCOUNTER — Other Ambulatory Visit (HOSPITAL_COMMUNITY): Payer: Self-pay

## 2023-01-23 DIAGNOSIS — J9610 Chronic respiratory failure, unspecified whether with hypoxia or hypercapnia: Secondary | ICD-10-CM | POA: Diagnosis not present

## 2023-01-23 DIAGNOSIS — M869 Osteomyelitis, unspecified: Secondary | ICD-10-CM | POA: Diagnosis not present

## 2023-01-23 DIAGNOSIS — I480 Paroxysmal atrial fibrillation: Secondary | ICD-10-CM | POA: Diagnosis not present

## 2023-01-23 DIAGNOSIS — J9691 Respiratory failure, unspecified with hypoxia: Secondary | ICD-10-CM | POA: Diagnosis not present

## 2023-01-23 DIAGNOSIS — Z743 Need for continuous supervision: Secondary | ICD-10-CM | POA: Diagnosis not present

## 2023-01-23 DIAGNOSIS — I482 Chronic atrial fibrillation, unspecified: Secondary | ICD-10-CM | POA: Diagnosis not present

## 2023-01-23 DIAGNOSIS — K5900 Constipation, unspecified: Secondary | ICD-10-CM | POA: Diagnosis not present

## 2023-01-23 DIAGNOSIS — M86172 Other acute osteomyelitis, left ankle and foot: Secondary | ICD-10-CM | POA: Diagnosis not present

## 2023-01-23 DIAGNOSIS — Z7401 Bed confinement status: Secondary | ICD-10-CM | POA: Diagnosis not present

## 2023-01-23 DIAGNOSIS — R0902 Hypoxemia: Secondary | ICD-10-CM | POA: Diagnosis not present

## 2023-01-23 DIAGNOSIS — E119 Type 2 diabetes mellitus without complications: Secondary | ICD-10-CM | POA: Diagnosis not present

## 2023-01-23 DIAGNOSIS — R410 Disorientation, unspecified: Secondary | ICD-10-CM | POA: Diagnosis not present

## 2023-01-23 DIAGNOSIS — J449 Chronic obstructive pulmonary disease, unspecified: Secondary | ICD-10-CM | POA: Diagnosis not present

## 2023-01-23 DIAGNOSIS — I5032 Chronic diastolic (congestive) heart failure: Secondary | ICD-10-CM | POA: Diagnosis not present

## 2023-01-23 DIAGNOSIS — M86272 Subacute osteomyelitis, left ankle and foot: Secondary | ICD-10-CM | POA: Diagnosis not present

## 2023-01-23 DIAGNOSIS — R1311 Dysphagia, oral phase: Secondary | ICD-10-CM | POA: Diagnosis not present

## 2023-01-23 DIAGNOSIS — E785 Hyperlipidemia, unspecified: Secondary | ICD-10-CM | POA: Diagnosis not present

## 2023-01-23 DIAGNOSIS — I10 Essential (primary) hypertension: Secondary | ICD-10-CM | POA: Diagnosis not present

## 2023-01-23 DIAGNOSIS — I5020 Unspecified systolic (congestive) heart failure: Secondary | ICD-10-CM | POA: Diagnosis not present

## 2023-01-23 DIAGNOSIS — Z719 Counseling, unspecified: Secondary | ICD-10-CM | POA: Diagnosis not present

## 2023-01-23 DIAGNOSIS — R531 Weakness: Secondary | ICD-10-CM | POA: Diagnosis not present

## 2023-01-23 DIAGNOSIS — J45909 Unspecified asthma, uncomplicated: Secondary | ICD-10-CM | POA: Diagnosis not present

## 2023-01-23 DIAGNOSIS — J45901 Unspecified asthma with (acute) exacerbation: Secondary | ICD-10-CM | POA: Diagnosis not present

## 2023-01-23 DIAGNOSIS — M6281 Muscle weakness (generalized): Secondary | ICD-10-CM | POA: Diagnosis not present

## 2023-01-23 DIAGNOSIS — R2681 Unsteadiness on feet: Secondary | ICD-10-CM | POA: Diagnosis not present

## 2023-01-23 LAB — GLUCOSE, CAPILLARY
Glucose-Capillary: 162 mg/dL — ABNORMAL HIGH (ref 70–99)
Glucose-Capillary: 286 mg/dL — ABNORMAL HIGH (ref 70–99)
Glucose-Capillary: 134 mg/dL — ABNORMAL HIGH (ref 70–99)

## 2023-01-23 LAB — BASIC METABOLIC PANEL
Anion gap: 11 (ref 5–15)
BUN: 64 mg/dL — ABNORMAL HIGH (ref 8–23)
CO2: 27 mmol/L (ref 22–32)
Calcium: 9 mg/dL (ref 8.9–10.3)
Chloride: 99 mmol/L (ref 98–111)
Creatinine, Ser: 2.6 mg/dL — ABNORMAL HIGH (ref 0.44–1.00)
GFR, Estimated: 18 mL/min — ABNORMAL LOW (ref >60)
Glucose, Bld: 148 mg/dL — ABNORMAL HIGH (ref 70–99)
Potassium: 4.2 mmol/L (ref 3.5–5.1)
Sodium: 137 mmol/L (ref 135–145)

## 2023-01-23 MED ORDER — DOXYCYCLINE HYCLATE 100 MG PO TABS
100.0000 mg | ORAL_TABLET | Freq: Two times a day (BID) | ORAL | 0 refills | Status: AC
  Filled 2023-01-23: qty 12, 6d supply, fill #0

## 2023-01-23 MED ORDER — CEPHALEXIN 500 MG PO CAPS
500.0000 mg | ORAL_CAPSULE | Freq: Three times a day (TID) | ORAL | 0 refills | Status: AC
  Filled 2023-01-23: qty 18, 6d supply, fill #0

## 2023-01-23 MED ORDER — INSULIN GLARGINE 100 UNIT/ML SOLOSTAR PEN
5.0000 [IU] | PEN_INJECTOR | Freq: Every day | SUBCUTANEOUS | 0 refills | Status: AC
  Filled 2023-01-23: qty 3, 60d supply, fill #0

## 2023-01-23 NOTE — TOC Transition Note (Signed)
Transition of Care [facility]) - CM/SW Discharge Note   Patient Details  Name: Lydia Myers MRN: [id number] Date of Birth: 1941-06-10  Transition of Care [facility]) CM/SW Contact:  Phyllis Ginger, RN Phone Number: 01/23/2023, 2:25 PM   Clinical Narrative: Going to [place], [address] report telephone# [phone]. will call [facility] shortly. No further CM needs      Final next level of care: [place] Barriers to Discharge: No Barriers Identified   Patient Goals and CMS Choice CMS Medicare.gov Compare Post Acute Care list provided to:: Patient Choice offered to / list presented to : Patient  Discharge Placement                Patient chooses bed at: [facility] Patient to be transferred to facility by:  Corey Harold) Name of family member notified:  (Angie(daughter)) Patient and family notified of of transfer: 01/23/23  Discharge Plan and Services Additional resources added to the After Visit Summary for     Discharge Planning Services: CM Consult                      HH Arranged: RN, PT [facility] Agency: [place] Date [facility] Agency Contacted: 01/18/23 Time HH Agency Contacted: [phone] Representative spoke with at [place]: [place] Determinants of Health ([place]) Interventions SDOH Screenings   Food Insecurity: No Food Insecurity (01/16/2023)  Housing: Low Risk  (01/16/2023)  Transportation Needs: No Transportation Needs (01/16/2023)  Utilities: Not At Risk (01/16/2023)  Tobacco Use: Low Risk  (01/15/2023)     Readmission Risk Interventions     No data to display

## 2023-01-23 NOTE — Discharge Summary (Signed)
Physician Discharge Summary  Lydia Myers [id number] DOB: 1941-10-28 DOA: 01/15/2023  PCP: Collene Leyden, MD  Admit date: 01/15/2023 Discharge date: 01/23/2023  Admitted From: Home Disposition: SNF  Recommendations for Outpatient Follow-up:  Follow up with PCP in 1-2 weeks Follow-up with vascular surgery as scheduled. Follow up with orthopedic surgery as necessary.   Discharge Condition: Stable CODE STATUS: Full Diet recommendation: Low-salt low-fat low-carb diet  Brief/Interim Summary: Patient is a 82 year old female history of arthritis, COPD/asthma, type 2 diabetes, paroxysmal A-fib on Xarelto amiodarone CHF, CHB status post PPM, CKD stage IIIb-4, hypertension hypothyroidism chronic venous stasis changes, glaucoma presented to the ED at the urging of family due to worsening leg wound on the left lower extremity with malodorous. It is noted that per chart caregiver stated wound has been progressive x 3 weeks. Patient admitted, plain films done with concerns for possible osteomyelitis however patient unable to get MRI due to PPM. Orthopedics consulted who recommended vascular surgery evaluation.   Patient admitted as above over concerns of left calcaneal osteomyelitis of the foot due to chronic nonhealing wound.  Per discussion with orthopedic surgery and vascular surgery patient was deemed to be a nonsurgical candidate at this time and would consider medical management.  Initially on vancomycin and cefepime transitioned to Keflex and doxycycline.  Will recommend at least 2 weeks of antibiotics with close follow-up with PCP vascular surgery and if needed orthopedic surgery.  If necessary patient's antibiotic course can be prolonged.  Patient's other chronic comorbid conditions appear to be well-controlled, unfortunately patient's A1c is elevated from prior at 8.8, will continue insulin at discharge as below.  Discharge Diagnoses:  Principal Problem:   Osteomyelitis ([place]) Active  Problems:   Paroxysmal atrial fibrillation ([place])   Hypertension   Type 2 diabetes mellitus with stage 3b chronic kidney disease, with long-term current use of insulin (HCC)   Chronic diastolic CHF (congestive heart failure) (HCC)  Left calcaneal bone/concern for osteomyelitis of the left calcaneal tuberosity -Patient presented with a 3-week history of worsening left lower extremity wound malodorous. -Plain films done concerning for possible osteomyelitis recommended MRI however MRI unable to be done due to history of noncompatible pacemaker. -CRP noted at 0.6.  Lactic acid at 1.2. -Patient transitioned to Keflex and doxycycline as of 01/18/2023. -Patient seen in consultation by orthopedics, who are recommending continuation of antibiotics and evaluation by vascular surgery who notes that patient had a previous ABI that was normal, patient noted to have a palpable left dorsalis pedis pulse, no signs of significant arterial insufficiency do not feel patient needs an arteriogram and does not feel this is an ischemic ulcer.   -Per vascular surgery may be related to chronic venous insufficiency versus lymphedema.   -Vascular surgery notes patient has an appointment on 01/31/2023 for venous reflux study and further workup and recommending outpatient wound care center follow-up for Unna boot changes.     COPD/asthma -Stable. -Continue nebs as needed.   Paroxysmal atrial fibrillation -Continue Toprol-XL, amiodarone for rate control.   -Xarelto initially held due to concern for possible procedure however resumed after no procedures planned.    Uncontrolled diabetes mellitus type 2 with hyperglycemia -Hemoglobin A1c 6.7 (12/09/2019) -CBG 187 this morning.  -Hemoglobin A1c 8.8 (01/16/2023 ). -Continue Semglee (per pharmacy Levemir will no longer be available in the [place] starting in April)   Hypothyroidism -Continue Synthroid.   Acute on chronic diastolic CHF, resolved -Resume home medications,  currently euvolemic.    Complete heart block status post  permanent pacemaker -Stable.   CKD stage IIIb-IV -Currently stable at baseline. -Outpatient follow-up with nephrology.    Glaucoma -Continue home regimen eyedrops.   Anxiety -Avoid benzodiazepines per beers criteria   Questionable sundowning -Initially placed on Zyprexa, can be weaned off at discharge   Transient hypoxia secondary to above -Per PT note patient noted to be hypoxic with sats of 84% on room air on 01/19/2023.. -Patient states has not been on oxygen prior to admission. -BNP noted to be slightly elevated at 156.9. -Repeat chest x-ray done 01/19/2023 with bibasilar atelectasis or infiltrates, increasing since prior study. -Patient improved with Lasix -Continue to wean oxygen as tolerated   Constipation, resolved -Patient with complaints of constipation per RN. -Oral sorbitol, Dulcolax suppository ordered however RN had to perform some disimpaction. -Soapsuds enema ordered with significant large bowel movement per RN. -Continue Senokot S twice daily.      Discharge Instructions  Discharge Instructions     Call MD for:  persistant nausea and vomiting   Complete by: As directed    Call MD for:  redness, tenderness, or signs of infection (pain, swelling, redness, odor or green/yellow discharge around incision site)   Complete by: As directed    Call MD for:  severe uncontrolled pain   Complete by: As directed    Call MD for:  temperature >100.4   Complete by: As directed    Diet - low sodium heart healthy   Complete by: As directed    Discharge wound care:   Complete by: As directed    Change Unna Boot to LEFT LEG 2 times a week per [facility]   Discharge wound care:   Complete by: As directed    Continue to monitor foot wound, dressing changes daily or more frequently if soiled as necessary   Increase activity slowly   Complete by: As directed    Increase activity slowly   Complete by: As directed        Allergies as of 01/23/2023   No Known Allergies      Medication List     STOP taking these medications    doxycycline 100 MG EC tablet Commonly known as: DORYX Replaced by: doxycycline 100 MG tablet   Levemir FlexTouch 100 UNIT/ML FlexPen Generic drug: insulin detemir   losartan 25 MG tablet Commonly known as: COZAAR       TAKE these medications    Accu-Chek Guide test strip Generic drug: glucose blood USE TO CHECK FINGER STICK BLOOD SUGAR TWICE DAILY   Accu-Chek Guide w/Device Kit USE TO CHECK BLOOD SUGAR 2 TIMES A DAY   Accu-Chek Softclix Lancets lancets 2 (two) times daily.   amiodarone 200 MG tablet Commonly known as: PACERONE Take one tablet by mouth Monday through Saturday. DO NOT TAKE on SUNDAY.   atorvastatin 10 MG tablet Commonly known as: LIPITOR TAKE 1 TABLET BY MOUTH EVERY DAY AT 6PM   BD Pen Needle Nano 2nd Gen 32G X 4 MM Misc Generic drug: Insulin Pen Needle USE ONCE A DAY WITH BASAGLAR KWIK PEN   cephALEXin 500 MG capsule Commonly known as: KEFLEX Take 1 capsule (500 mg total) by mouth every 8 (eight) hours for 6 days.   doxycycline 100 MG tablet Commonly known as: VIBRA-TABS Take 1 tablet (100 mg total) by mouth every 12 (twelve) hours for 6 days. Replaces: doxycycline 100 MG EC tablet   insulin glargine 100 UNIT/ML Solostar Pen Commonly known as: LANTUS Inject 5 Units into the skin daily.  Januvia 25 MG tablet Generic drug: sitaGLIPtin Take 25 mg by mouth daily.   levothyroxine 88 MCG tablet Commonly known as: SYNTHROID Take 88 mcg by mouth every morning.   metoprolol succinate 25 MG 24 hr tablet Commonly known as: TOPROL-XL Take 25 mg by mouth daily.   Rivaroxaban 15 MG Tabs tablet Commonly known as: Xarelto TAKE 1 TABLET BY MOUTH EVERY DAY WITH DINNER   saccharomyces boulardii 250 MG capsule Commonly known as: FLORASTOR Take 1 capsule (250 mg total) by mouth 2 (two) times daily.   torsemide 20 MG tablet Commonly  known as: DEMADEX Take 1 tablet (20 mg total) by mouth 2 (two) times daily.               Durable Medical Equipment  (From admission, onward)           Start     Ordered   01/21/23 1808  For home use only DME 4 wheeled rolling walker with seat  Once       Question:  Patient needs a walker to treat with the following condition  Answer:  Debility   01/21/23 1807   01/21/23 1808  For home use only DME 3 n 1  Once        01/21/23 1807   01/21/23 1159  For home use only DME oxygen  Once       Question Answer Comment  Length of Need 12 Months   Mode or (Route) Nasal cannula   Liters per Minute 2   Frequency Continuous (stationary and portable oxygen unit needed)   Oxygen conserving device Yes   Oxygen delivery system Gas      01/21/23 1159              Discharge Care Instructions  (From admission, onward)           Start     Ordered   01/23/23 0000  Discharge wound care:       Comments: Continue to monitor foot wound, dressing changes daily or more frequently if soiled as necessary   01/23/23 1124   01/19/23 0000  Discharge wound care:       Comments: Change Unna Boot to LEFT LEG 2 times a week per [facility]   01/19/23 1204            Contact information for follow-up providers     [place] [facility] Follow up on 02/21/2023.   Specialty: Wound Care Why: 8a Contact information: [place], [address] [id number] [place] Follow up.   Why: [place] nursing/physical therapy Contact information: [address] [place] [address] [phone]         Collene Leyden, MD. Schedule an appointment as soon as possible for a visit in 2 week(s).   Specialty: Family Medicine Contact information: [place] [address] [phone]         [place] [facility] Follow up on 01/31/2023.   Why: Follow-up as  scheduled on 01/31/2023             Contact information for after-discharge care     Destination     [facility] .   Service: Skilled Nursing Contact information: [id number] [place] [phone]  No Known Allergies  Consultations: Orthopedic surgery, vascular surgery   Procedures/Studies: DG Chest 2 View  Result Date: 01/19/2023 CLINICAL DATA:  Hypoxia EXAM: CHEST - 2 VIEW COMPARISON:  01/15/2023 FINDINGS: Left pacer remains in place, unchanged. Cardiomegaly. Aortic atherosclerosis. Increasing bibasilar opacities could reflect atelectasis or infiltrates. No visible effusions or pneumothorax. No acute bony abnormality. IMPRESSION: Cardiomegaly. Bibasilar atelectasis or infiltrates, increasing since prior study. Electronically Signed   By: Rolm Baptise M.D.   On: 01/19/2023 20:23   DG Chest 2 View  Result Date: 01/15/2023 CLINICAL DATA:  Lower extremity edema EXAM: CHEST - 2 VIEW COMPARISON:  08/02/2020 FINDINGS: Chronic cardiomegaly. Dual lead pacemaker. Possible pulmonary venous hypertension but no frank edema. No visible effusion. Mild chronic scarring at the left lung base. IMPRESSION: Chronic cardiomegaly. Dual lead pacemaker. Possible pulmonary venous hypertension but no frank edema. Electronically Signed   By: Nelson Chimes M.D.   On: 01/15/2023 14:08   DG Foot Complete Left  Result Date: 01/15/2023 CLINICAL DATA:  Wound cellulitis back of left leg. EXAM: LEFT FOOT - COMPLETE 3+ VIEW COMPARISON:  Skip of cellulitis. FINDINGS: Bones are diffusely demineralized which limits assessment. Within this limitation, there is no evidence for an acute fracture or dislocation. No worrisome lytic or sclerotic osseous abnormality. Skin irregularity posterior ankle region may be related to the patient's described wound. There is some cortical irregularity of the calcaneal tuberosity underlying  this region. IMPRESSION: 1. Skin irregularity posterior ankle region may be related to the patient's described wound. There is some cortical irregularity of the calcaneal tuberosity underlying this region. Osteomyelitis a concern. MRI with and without contrast could be used to further evaluate as clinically warranted. Electronically Signed   By: Misty Stanley M.D.   On: 01/15/2023 11:45     Subjective: No acute issues or events overnight   Discharge Exam: Vitals:   01/22/23 2124 01/23/23 0551  BP: (!) 111/48 136/63  Pulse: 60 75  Resp: 19 19  Temp: 97.7 F (36.5 C) 97.9 F (36.6 C)  SpO2: 100% 92%   Vitals:   01/22/23 1317 01/22/23 2124 01/23/23 0500 01/23/23 0551  BP: 134/66 (!) 111/48  136/63  Pulse: 70 60  75  Resp: 18 19  19   Temp: 98.4 F (36.9 C) 97.7 F (36.5 C)  97.9 F (36.6 C)  TempSrc: Oral Oral  Oral  SpO2: 100% 100%  92%  Weight:   80.6 kg   Height:        General: Pt is alert, awake, not in acute distress Cardiovascular: RRR, S1/S2 +, no rubs, no gallops Respiratory: CTA bilaterally, no wheezing, no rhonchi Abdominal: Soft, NT, ND, bowel sounds + Extremities: Chronic venous stasis bilateral lower extremities    The results of significant diagnostics from this hospitalization (including imaging, microbiology, ancillary and laboratory) are listed below for reference.     Microbiology: Recent Results (from the past 240 hour(s))  Blood Cultures x 2 sites     Status: None   Collection Time: 01/15/23  1:17 PM   Specimen: BLOOD  Result Value Ref Range Status   Specimen Description   Final    BLOOD RIGHT ANTECUBITAL Performed at [facility] [address], [place], [place] [address]    Special Requests   Final    BOTTLES DRAWN AEROBIC AND ANAEROBIC Blood Culture adequate volume Performed at [facility] [address], [place], [place] [address]    Culture   Final    NO GROWTH  5 DAYS Performed at [facility], [place] [address], [place], [place] [address]    Report Status 01/22/2023 FINAL  Final  Blood Cultures x 2 sites     Status: None   Collection Time: 01/15/23  1:38 PM   Specimen: BLOOD LEFT FOREARM  Result Value Ref Range Status   Specimen Description   Final    BLOOD LEFT FOREARM Performed at [facility], [place] [address], [place], [place] [address]    Special Requests   Final    BOTTLES DRAWN AEROBIC AND ANAEROBIC Blood Culture results may not be optimal due to an inadequate volume of blood received in culture bottles Performed at [facility] [address], [place], [place] [address]    Culture   Final    NO GROWTH 5 DAYS Performed at [facility], [place] [address] [place], [place] [address]    Report Status 01/20/2023 FINAL  Final     Labs: BNP (last 3 results) Recent Labs    01/19/23 0720  BNP [id number]*   Basic Metabolic Panel: Recent Labs  Lab 01/17/23 0852 01/18/23 0541 01/19/23 0720 01/20/23 0506 01/21/23 0457 01/22/23 0525 01/23/23 0519  NA 140   < > 136 136 136 136 137  K 4.2   < > 3.7 3.8 3.8 4.0 4.2  CL 95*   < > 94* 97* 97* 96* 99  CO2 27   < > 26 27 26 27 27   GLUCOSE 157*   < > 169* 178* 170* 197* 148*  BUN 27*   < > 46* 48* 58* 61* 64*  CREATININE 2.02*   < > 2.86* 2.69* 2.99* 2.70* 2.60*  CALCIUM 9.3   < > 9.0 8.8* 8.9 9.0 9.0  PHOS 4.0  --   --   --   --   --   --    < > = values in this interval not displayed.   Liver Function Tests: Recent Labs  Lab 01/17/23 0852 01/19/23 0720  AST  --  17  ALT  --  12  ALKPHOS  --  62  BILITOT  --  0.8  PROT  --  6.6  ALBUMIN 4.0 3.3*   No results for input(s): "LIPASE", "AMYLASE" in the last 168 hours. No results for input(s): "AMMONIA" in the last 168 hours. CBC: Recent Labs  Lab 01/17/23 0503 01/18/23 0541 01/19/23 0720 01/20/23 0506 01/21/23 0457  WBC 12.6* 10.9* 10.0 8.5 7.9  NEUTROABS 10.0* 8.9*  --   --  5.4  HGB 13.2 11.3* 11.1* 11.5* 10.8*  HCT  43.0 37.0 35.6* 37.1 35.4*  MCV 94.7 95.1 94.4 94.4 95.2  PLT 232 235 220 243 248   Cardiac Enzymes: No results for input(s): "CKTOTAL", "CKMB", "CKMBINDEX", "TROPONINI" in the last 168 hours. BNP: Invalid input(s): "POCBNP" CBG: Recent Labs  Lab 01/22/23 0732 01/22/23 1143 01/22/23 1640 01/22/23 2126 01/23/23 0726  GLUCAP 187* 206* 339* 149* 162*   D-Dimer No results for input(s): "DDIMER" in the last 72 hours. Hgb A1c No results for input(s): "HGBA1C" in the last 72 hours. Lipid Profile No results for input(s): "CHOL", "HDL", "LDLCALC", "TRIG", "CHOLHDL", "LDLDIRECT" in the last 72 hours. Thyroid function studies No results for input(s): "TSH", "T4TOTAL", "T3FREE", "THYROIDAB" in the last 72 hours.  Invalid input(s): "FREET3" Anemia work up No results for input(s): "VITAMINB12", "FOLATE", "FERRITIN", "TIBC", "IRON", "RETICCTPCT" in the last 72 hours. Urinalysis    Component Value Date/Time  COLORURINE YELLOW 01/15/2023 1852   APPEARANCEUR HAZY (A) 01/15/2023 1852   LABSPEC 1.010 01/15/2023 1852   PHURINE 6.5 01/15/2023 1852   GLUCOSEU NEGATIVE 01/15/2023 1852   HGBUR SMALL (A) 01/15/2023 1852   BILIRUBINUR NEGATIVE 01/15/2023 [place] 01/15/2023 1852   PROTEINUR NEGATIVE 01/15/2023 1852   UROBILINOGEN 1.0 09/30/2010 1812   NITRITE NEGATIVE 01/15/2023 1852   LEUKOCYTESUR LARGE (A) 01/15/2023 1852   Sepsis Labs Recent Labs  Lab 01/18/23 0541 01/19/23 0720 01/20/23 0506 01/21/23 0457  WBC 10.9* 10.0 8.5 7.9   Microbiology Recent Results (from the past 240 hour(s))  Blood Cultures x 2 sites     Status: None   Collection Time: 01/15/23  1:17 PM   Specimen: BLOOD  Result Value Ref Range Status   Specimen Description   Final    BLOOD RIGHT ANTECUBITAL Performed at [facility], [place] [address] [place], [place] [address]    Special Requests   Final    BOTTLES DRAWN AEROBIC AND ANAEROBIC Blood Culture adequate  volume Performed at [facility] [address], [place], [place] [address]    Culture   Final    NO GROWTH 5 DAYS Performed at [facility], [place] [address], [place], [place] [address]    Report Status 01/22/2023 FINAL  Final  Blood Cultures x 2 sites     Status: None   Collection Time: 01/15/23  1:38 PM   Specimen: BLOOD LEFT FOREARM  Result Value Ref Range Status   Specimen Description   Final    BLOOD LEFT FOREARM Performed at [facility], [place] [address] [place], [place] [address]    Special Requests   Final    BOTTLES DRAWN AEROBIC AND ANAEROBIC Blood Culture results may not be optimal due to an inadequate volume of blood received in culture bottles Performed at [facility] [address] [place], [place] [address]    Culture   Final    NO GROWTH 5 DAYS Performed at [facility], [place] [address], [place], [place] [address]    Report Status 01/20/2023 FINAL  Final     Time coordinating discharge: Over 30 minutes  SIGNED:   Little Ishikawa, DO [facility] 01/23/2023, 11:24 AM Pager   If 7PM-7AM, please contact night-coverage [email or web address]

## 2023-01-23 NOTE — Progress Notes (Signed)
Pt transported by ptar from [place] to [place]. Sister, Surveyor, mining, notified.

## 2023-01-23 NOTE — Progress Notes (Signed)
Occupational Therapy Treatment Patient Details Name: Lydia Grondahl Quint MRN: [id number] DOB: [birth year] Today's Date: 01/23/2023   History of present illness Patient is a 82 year old female history of arthritis, COPD/asthma, type 2 diabetes, paroxysmal A-fib on Xarelto amiodarone CHF, CHB status post PPM, CKD stage IIIb-4, hypertension hypothyroidism chronic venous stasis changes, glaucoma presented to the ED at the urging of family due to worsening leg wound on the left lower extremity   OT comments  Patient was able to engage in ADLs with reduced assistance progressing towards goals. Patient was able to complete toileting tasks with min guard with RW with increased time. Patient's discharge plan remains appropriate at this time. OT will continue to follow acutely.     Recommendations for follow up therapy are one component of a multi-disciplinary discharge planning process, led by the attending physician.  Recommendations may be updated based on patient status, additional functional criteria and insurance authorization.    Follow Up Recommendations  Skilled nursing-short term rehab (<3 hours/day)     Assistance Recommended at Discharge Intermittent Supervision/Assistance  Patient can return home with the following  A lot of help with bathing/dressing/bathroom;Assistance with cooking/housework;Direct supervision/assist for medications management;Assist for transportation;Direct supervision/assist for financial management   Equipment Recommendations  None recommended by OT       Precautions / Restrictions Precautions Precautions: Fall Precaution Comments: monitor sats, left leg wound, incontinent urine-on Lasix Restrictions Weight Bearing Restrictions: No              ADL either performed or assessed with clinical judgement   ADL Overall ADL's : Needs assistance/impaired         Toilet Transfer: Min guard;Rolling walker (2 wheels);BSC/3in1   Toileting- Clothing Manipulation  and Hygiene: Min guard;Sit to/from stand Toileting - Clothing Manipulation Details (indicate cue type and reason): with cues to pull up mesh underwear prior to attempting to walk back to recliner with RW              Cognition Arousal/Alertness: Awake/alert Behavior During Therapy: WFL for tasks assessed/performed Overall Cognitive Status: Within Functional Limits for tasks assessed         General Comments: patient is plesant and cooperative. continues to have poor insight to deficits.        Exercises Other Exercises Other Exercises: 5 reps sit to stands x 2 sets with min guard with occasional cues for proper hand placement            Pertinent Vitals/ Pain       Pain Assessment Pain Assessment: No/denies pain         Frequency  Min 2X/week        Progress Toward Goals  OT Goals(current goals can now be found in the care plan section)  Progress towards OT goals: Progressing toward goals     Plan Discharge plan remains appropriate       AM-PAC OT "6 Clicks" Daily Activity     Outcome Measure   Help from another person eating meals?: A Little Help from another person taking care of personal grooming?: A Little Help from another person toileting, which includes using toliet, bedpan, or urinal?: A Little Help from another person bathing (including washing, rinsing, drying)?: A Lot Help from another person to put on and taking off regular upper body clothing?: A Little Help from another person to put on and taking off regular lower body clothing?: A Lot 6 Click Score: 16    End of Session Equipment Utilized During Treatment: Gait  belt;Rolling walker (2 wheels)  OT Visit Diagnosis: Pain   Activity Tolerance Patient tolerated treatment well   Patient Left with call bell/phone within reach;in chair;with chair alarm set   Nurse Communication Other (comment) (ok to see patient)        Time: [id number] OT Time Calculation (min): 28 min  Charges: OT  General Charges $OT Visit: 1 Visit OT Treatments $Self Care/Home Management : 23-37 mins  Rennie Plowman, MS Acute Rehabilitation Department Office# [phone]   Willa Rough 01/23/2023, 12:01 PM

## 2023-01-24 ENCOUNTER — Other Ambulatory Visit (HOSPITAL_COMMUNITY): Payer: Self-pay

## 2023-01-24 NOTE — Progress Notes (Signed)
Remote pacemaker transmission.   

## 2023-01-30 DIAGNOSIS — M6281 Muscle weakness (generalized): Secondary | ICD-10-CM | POA: Diagnosis not present

## 2023-01-30 DIAGNOSIS — J9691 Respiratory failure, unspecified with hypoxia: Secondary | ICD-10-CM | POA: Diagnosis not present

## 2023-01-30 DIAGNOSIS — R2681 Unsteadiness on feet: Secondary | ICD-10-CM | POA: Diagnosis not present

## 2023-01-30 DIAGNOSIS — M86172 Other acute osteomyelitis, left ankle and foot: Secondary | ICD-10-CM | POA: Diagnosis not present

## 2023-01-30 DIAGNOSIS — I5032 Chronic diastolic (congestive) heart failure: Secondary | ICD-10-CM | POA: Diagnosis not present

## 2023-01-30 DIAGNOSIS — I480 Paroxysmal atrial fibrillation: Secondary | ICD-10-CM | POA: Diagnosis not present

## 2023-01-30 NOTE — Telephone Encounter (Signed)
Left message for patient to call and schedule the Echocardiogram ordered by Nicholes Rough, PA

## 2023-01-31 ENCOUNTER — Ambulatory Visit (HOSPITAL_COMMUNITY): Payer: Medicare Other | Attending: Vascular Surgery

## 2023-01-31 DIAGNOSIS — K5900 Constipation, unspecified: Secondary | ICD-10-CM | POA: Diagnosis not present

## 2023-02-02 DIAGNOSIS — J45901 Unspecified asthma with (acute) exacerbation: Secondary | ICD-10-CM | POA: Diagnosis not present

## 2023-02-02 DIAGNOSIS — M86272 Subacute osteomyelitis, left ankle and foot: Secondary | ICD-10-CM | POA: Diagnosis not present

## 2023-02-02 DIAGNOSIS — I482 Chronic atrial fibrillation, unspecified: Secondary | ICD-10-CM | POA: Diagnosis not present

## 2023-02-02 DIAGNOSIS — I5020 Unspecified systolic (congestive) heart failure: Secondary | ICD-10-CM | POA: Diagnosis not present

## 2023-02-04 NOTE — Telephone Encounter (Signed)
Left message for patient to call and schedule the Echocardiogram ordered by Tessa Conte, PA 

## 2023-02-06 DIAGNOSIS — R2681 Unsteadiness on feet: Secondary | ICD-10-CM | POA: Diagnosis not present

## 2023-02-06 DIAGNOSIS — M86172 Other acute osteomyelitis, left ankle and foot: Secondary | ICD-10-CM | POA: Diagnosis not present

## 2023-02-06 DIAGNOSIS — I5032 Chronic diastolic (congestive) heart failure: Secondary | ICD-10-CM | POA: Diagnosis not present

## 2023-02-06 DIAGNOSIS — M6281 Muscle weakness (generalized): Secondary | ICD-10-CM | POA: Diagnosis not present

## 2023-02-06 DIAGNOSIS — I480 Paroxysmal atrial fibrillation: Secondary | ICD-10-CM | POA: Diagnosis not present

## 2023-02-06 DIAGNOSIS — J9691 Respiratory failure, unspecified with hypoxia: Secondary | ICD-10-CM | POA: Diagnosis not present

## 2023-02-07 ENCOUNTER — Encounter (HOSPITAL_BASED_OUTPATIENT_CLINIC_OR_DEPARTMENT_OTHER): Payer: Self-pay | Admitting: Physician Assistant

## 2023-02-08 DIAGNOSIS — E785 Hyperlipidemia, unspecified: Secondary | ICD-10-CM | POA: Diagnosis not present

## 2023-02-08 DIAGNOSIS — I10 Essential (primary) hypertension: Secondary | ICD-10-CM | POA: Diagnosis not present

## 2023-02-08 DIAGNOSIS — I5020 Unspecified systolic (congestive) heart failure: Secondary | ICD-10-CM | POA: Diagnosis not present

## 2023-02-08 DIAGNOSIS — I482 Chronic atrial fibrillation, unspecified: Secondary | ICD-10-CM | POA: Diagnosis not present

## 2023-02-10 DIAGNOSIS — E7800 Pure hypercholesterolemia, unspecified: Secondary | ICD-10-CM | POA: Diagnosis not present

## 2023-02-10 DIAGNOSIS — Z556 Problems related to health literacy: Secondary | ICD-10-CM | POA: Diagnosis not present

## 2023-02-10 DIAGNOSIS — I5032 Chronic diastolic (congestive) heart failure: Secondary | ICD-10-CM | POA: Diagnosis not present

## 2023-02-10 DIAGNOSIS — I130 Hypertensive heart and chronic kidney disease with heart failure and stage 1 through stage 4 chronic kidney disease, or unspecified chronic kidney disease: Secondary | ICD-10-CM | POA: Diagnosis not present

## 2023-02-10 DIAGNOSIS — I480 Paroxysmal atrial fibrillation: Secondary | ICD-10-CM | POA: Diagnosis not present

## 2023-02-10 DIAGNOSIS — I495 Sick sinus syndrome: Secondary | ICD-10-CM | POA: Diagnosis not present

## 2023-02-10 DIAGNOSIS — N184 Chronic kidney disease, stage 4 (severe): Secondary | ICD-10-CM | POA: Diagnosis not present

## 2023-02-10 DIAGNOSIS — J9610 Chronic respiratory failure, unspecified whether with hypoxia or hypercapnia: Secondary | ICD-10-CM | POA: Diagnosis not present

## 2023-02-10 DIAGNOSIS — E1169 Type 2 diabetes mellitus with other specified complication: Secondary | ICD-10-CM | POA: Diagnosis not present

## 2023-02-10 DIAGNOSIS — E213 Hyperparathyroidism, unspecified: Secondary | ICD-10-CM | POA: Diagnosis not present

## 2023-02-10 DIAGNOSIS — I442 Atrioventricular block, complete: Secondary | ICD-10-CM | POA: Diagnosis not present

## 2023-02-10 DIAGNOSIS — J4489 Other specified chronic obstructive pulmonary disease: Secondary | ICD-10-CM | POA: Diagnosis not present

## 2023-02-10 DIAGNOSIS — R1311 Dysphagia, oral phase: Secondary | ICD-10-CM | POA: Diagnosis not present

## 2023-02-10 DIAGNOSIS — E1122 Type 2 diabetes mellitus with diabetic chronic kidney disease: Secondary | ICD-10-CM | POA: Diagnosis not present

## 2023-02-10 DIAGNOSIS — I878 Other specified disorders of veins: Secondary | ICD-10-CM | POA: Diagnosis not present

## 2023-02-10 DIAGNOSIS — M86272 Subacute osteomyelitis, left ankle and foot: Secondary | ICD-10-CM | POA: Diagnosis not present

## 2023-02-10 DIAGNOSIS — I429 Cardiomyopathy, unspecified: Secondary | ICD-10-CM | POA: Diagnosis not present

## 2023-02-10 DIAGNOSIS — Z950 Presence of cardiac pacemaker: Secondary | ICD-10-CM | POA: Diagnosis not present

## 2023-02-10 DIAGNOSIS — I501 Left ventricular failure: Secondary | ICD-10-CM | POA: Diagnosis not present

## 2023-02-10 DIAGNOSIS — Z794 Long term (current) use of insulin: Secondary | ICD-10-CM | POA: Diagnosis not present

## 2023-02-10 DIAGNOSIS — E039 Hypothyroidism, unspecified: Secondary | ICD-10-CM | POA: Diagnosis not present

## 2023-02-10 DIAGNOSIS — M1990 Unspecified osteoarthritis, unspecified site: Secondary | ICD-10-CM | POA: Diagnosis not present

## 2023-02-10 DIAGNOSIS — I4892 Unspecified atrial flutter: Secondary | ICD-10-CM | POA: Diagnosis not present

## 2023-02-10 DIAGNOSIS — H409 Unspecified glaucoma: Secondary | ICD-10-CM | POA: Diagnosis not present

## 2023-02-11 DIAGNOSIS — E039 Hypothyroidism, unspecified: Secondary | ICD-10-CM | POA: Diagnosis not present

## 2023-02-11 DIAGNOSIS — H409 Unspecified glaucoma: Secondary | ICD-10-CM | POA: Diagnosis not present

## 2023-02-11 DIAGNOSIS — I4892 Unspecified atrial flutter: Secondary | ICD-10-CM | POA: Diagnosis not present

## 2023-02-11 DIAGNOSIS — Z556 Problems related to health literacy: Secondary | ICD-10-CM | POA: Diagnosis not present

## 2023-02-11 DIAGNOSIS — I495 Sick sinus syndrome: Secondary | ICD-10-CM | POA: Diagnosis not present

## 2023-02-11 DIAGNOSIS — I130 Hypertensive heart and chronic kidney disease with heart failure and stage 1 through stage 4 chronic kidney disease, or unspecified chronic kidney disease: Secondary | ICD-10-CM | POA: Diagnosis not present

## 2023-02-11 DIAGNOSIS — N184 Chronic kidney disease, stage 4 (severe): Secondary | ICD-10-CM | POA: Diagnosis not present

## 2023-02-11 DIAGNOSIS — I878 Other specified disorders of veins: Secondary | ICD-10-CM | POA: Diagnosis not present

## 2023-02-11 DIAGNOSIS — E7800 Pure hypercholesterolemia, unspecified: Secondary | ICD-10-CM | POA: Diagnosis not present

## 2023-02-11 DIAGNOSIS — I429 Cardiomyopathy, unspecified: Secondary | ICD-10-CM | POA: Diagnosis not present

## 2023-02-11 DIAGNOSIS — I442 Atrioventricular block, complete: Secondary | ICD-10-CM | POA: Diagnosis not present

## 2023-02-11 DIAGNOSIS — E1169 Type 2 diabetes mellitus with other specified complication: Secondary | ICD-10-CM | POA: Diagnosis not present

## 2023-02-11 DIAGNOSIS — J4489 Other specified chronic obstructive pulmonary disease: Secondary | ICD-10-CM | POA: Diagnosis not present

## 2023-02-11 DIAGNOSIS — I5032 Chronic diastolic (congestive) heart failure: Secondary | ICD-10-CM | POA: Diagnosis not present

## 2023-02-11 DIAGNOSIS — I480 Paroxysmal atrial fibrillation: Secondary | ICD-10-CM | POA: Diagnosis not present

## 2023-02-11 DIAGNOSIS — I501 Left ventricular failure: Secondary | ICD-10-CM | POA: Diagnosis not present

## 2023-02-11 DIAGNOSIS — E1122 Type 2 diabetes mellitus with diabetic chronic kidney disease: Secondary | ICD-10-CM | POA: Diagnosis not present

## 2023-02-11 DIAGNOSIS — R1311 Dysphagia, oral phase: Secondary | ICD-10-CM | POA: Diagnosis not present

## 2023-02-11 DIAGNOSIS — M1990 Unspecified osteoarthritis, unspecified site: Secondary | ICD-10-CM | POA: Diagnosis not present

## 2023-02-11 DIAGNOSIS — Z950 Presence of cardiac pacemaker: Secondary | ICD-10-CM | POA: Diagnosis not present

## 2023-02-11 DIAGNOSIS — Z794 Long term (current) use of insulin: Secondary | ICD-10-CM | POA: Diagnosis not present

## 2023-02-11 DIAGNOSIS — M86272 Subacute osteomyelitis, left ankle and foot: Secondary | ICD-10-CM | POA: Diagnosis not present

## 2023-02-11 DIAGNOSIS — J9610 Chronic respiratory failure, unspecified whether with hypoxia or hypercapnia: Secondary | ICD-10-CM | POA: Diagnosis not present

## 2023-02-11 DIAGNOSIS — E213 Hyperparathyroidism, unspecified: Secondary | ICD-10-CM | POA: Diagnosis not present

## 2023-02-12 DIAGNOSIS — S81802A Unspecified open wound, left lower leg, initial encounter: Secondary | ICD-10-CM | POA: Diagnosis not present

## 2023-02-12 DIAGNOSIS — S81801A Unspecified open wound, right lower leg, initial encounter: Secondary | ICD-10-CM | POA: Diagnosis not present

## 2023-02-13 DIAGNOSIS — R1311 Dysphagia, oral phase: Secondary | ICD-10-CM | POA: Diagnosis not present

## 2023-02-13 DIAGNOSIS — I878 Other specified disorders of veins: Secondary | ICD-10-CM | POA: Diagnosis not present

## 2023-02-13 DIAGNOSIS — Z794 Long term (current) use of insulin: Secondary | ICD-10-CM | POA: Diagnosis not present

## 2023-02-13 DIAGNOSIS — I501 Left ventricular failure: Secondary | ICD-10-CM | POA: Diagnosis not present

## 2023-02-13 DIAGNOSIS — E039 Hypothyroidism, unspecified: Secondary | ICD-10-CM | POA: Diagnosis not present

## 2023-02-13 DIAGNOSIS — I5032 Chronic diastolic (congestive) heart failure: Secondary | ICD-10-CM | POA: Diagnosis not present

## 2023-02-13 DIAGNOSIS — E7800 Pure hypercholesterolemia, unspecified: Secondary | ICD-10-CM | POA: Diagnosis not present

## 2023-02-13 DIAGNOSIS — I442 Atrioventricular block, complete: Secondary | ICD-10-CM | POA: Diagnosis not present

## 2023-02-13 DIAGNOSIS — E1169 Type 2 diabetes mellitus with other specified complication: Secondary | ICD-10-CM | POA: Diagnosis not present

## 2023-02-13 DIAGNOSIS — Z556 Problems related to health literacy: Secondary | ICD-10-CM | POA: Diagnosis not present

## 2023-02-13 DIAGNOSIS — N184 Chronic kidney disease, stage 4 (severe): Secondary | ICD-10-CM | POA: Diagnosis not present

## 2023-02-13 DIAGNOSIS — I480 Paroxysmal atrial fibrillation: Secondary | ICD-10-CM | POA: Diagnosis not present

## 2023-02-13 DIAGNOSIS — I495 Sick sinus syndrome: Secondary | ICD-10-CM | POA: Diagnosis not present

## 2023-02-13 DIAGNOSIS — H409 Unspecified glaucoma: Secondary | ICD-10-CM | POA: Diagnosis not present

## 2023-02-13 DIAGNOSIS — Z950 Presence of cardiac pacemaker: Secondary | ICD-10-CM | POA: Diagnosis not present

## 2023-02-13 DIAGNOSIS — M86272 Subacute osteomyelitis, left ankle and foot: Secondary | ICD-10-CM | POA: Diagnosis not present

## 2023-02-13 DIAGNOSIS — I4892 Unspecified atrial flutter: Secondary | ICD-10-CM | POA: Diagnosis not present

## 2023-02-13 DIAGNOSIS — M1990 Unspecified osteoarthritis, unspecified site: Secondary | ICD-10-CM | POA: Diagnosis not present

## 2023-02-13 DIAGNOSIS — E1122 Type 2 diabetes mellitus with diabetic chronic kidney disease: Secondary | ICD-10-CM | POA: Diagnosis not present

## 2023-02-13 DIAGNOSIS — J4489 Other specified chronic obstructive pulmonary disease: Secondary | ICD-10-CM | POA: Diagnosis not present

## 2023-02-13 DIAGNOSIS — I429 Cardiomyopathy, unspecified: Secondary | ICD-10-CM | POA: Diagnosis not present

## 2023-02-13 DIAGNOSIS — E213 Hyperparathyroidism, unspecified: Secondary | ICD-10-CM | POA: Diagnosis not present

## 2023-02-13 DIAGNOSIS — I130 Hypertensive heart and chronic kidney disease with heart failure and stage 1 through stage 4 chronic kidney disease, or unspecified chronic kidney disease: Secondary | ICD-10-CM | POA: Diagnosis not present

## 2023-02-13 DIAGNOSIS — J9610 Chronic respiratory failure, unspecified whether with hypoxia or hypercapnia: Secondary | ICD-10-CM | POA: Diagnosis not present

## 2023-02-14 DIAGNOSIS — S81802A Unspecified open wound, left lower leg, initial encounter: Secondary | ICD-10-CM | POA: Diagnosis not present

## 2023-02-14 DIAGNOSIS — S81801A Unspecified open wound, right lower leg, initial encounter: Secondary | ICD-10-CM | POA: Diagnosis not present

## 2023-02-15 DIAGNOSIS — E1169 Type 2 diabetes mellitus with other specified complication: Secondary | ICD-10-CM | POA: Diagnosis not present

## 2023-02-15 DIAGNOSIS — J449 Chronic obstructive pulmonary disease, unspecified: Secondary | ICD-10-CM | POA: Diagnosis not present

## 2023-02-15 DIAGNOSIS — E039 Hypothyroidism, unspecified: Secondary | ICD-10-CM | POA: Diagnosis not present

## 2023-02-15 DIAGNOSIS — E1122 Type 2 diabetes mellitus with diabetic chronic kidney disease: Secondary | ICD-10-CM | POA: Diagnosis not present

## 2023-02-15 DIAGNOSIS — E1165 Type 2 diabetes mellitus with hyperglycemia: Secondary | ICD-10-CM | POA: Diagnosis not present

## 2023-02-15 DIAGNOSIS — E1121 Type 2 diabetes mellitus with diabetic nephropathy: Secondary | ICD-10-CM | POA: Diagnosis not present

## 2023-02-15 DIAGNOSIS — M869 Osteomyelitis, unspecified: Secondary | ICD-10-CM | POA: Diagnosis not present

## 2023-02-15 DIAGNOSIS — E559 Vitamin D deficiency, unspecified: Secondary | ICD-10-CM | POA: Diagnosis not present

## 2023-02-15 DIAGNOSIS — I4891 Unspecified atrial fibrillation: Secondary | ICD-10-CM | POA: Diagnosis not present

## 2023-02-15 DIAGNOSIS — I700 Atherosclerosis of aorta: Secondary | ICD-10-CM | POA: Diagnosis not present

## 2023-02-15 DIAGNOSIS — D6869 Other thrombophilia: Secondary | ICD-10-CM | POA: Diagnosis not present

## 2023-02-15 DIAGNOSIS — E7800 Pure hypercholesterolemia, unspecified: Secondary | ICD-10-CM | POA: Diagnosis not present

## 2023-02-15 DIAGNOSIS — I429 Cardiomyopathy, unspecified: Secondary | ICD-10-CM | POA: Diagnosis not present

## 2023-02-19 DIAGNOSIS — I480 Paroxysmal atrial fibrillation: Secondary | ICD-10-CM | POA: Diagnosis not present

## 2023-02-19 DIAGNOSIS — E1122 Type 2 diabetes mellitus with diabetic chronic kidney disease: Secondary | ICD-10-CM | POA: Diagnosis not present

## 2023-02-19 DIAGNOSIS — E1169 Type 2 diabetes mellitus with other specified complication: Secondary | ICD-10-CM | POA: Diagnosis not present

## 2023-02-19 DIAGNOSIS — E7800 Pure hypercholesterolemia, unspecified: Secondary | ICD-10-CM | POA: Diagnosis not present

## 2023-02-19 DIAGNOSIS — M1990 Unspecified osteoarthritis, unspecified site: Secondary | ICD-10-CM | POA: Diagnosis not present

## 2023-02-19 DIAGNOSIS — Z950 Presence of cardiac pacemaker: Secondary | ICD-10-CM | POA: Diagnosis not present

## 2023-02-19 DIAGNOSIS — I4892 Unspecified atrial flutter: Secondary | ICD-10-CM | POA: Diagnosis not present

## 2023-02-19 DIAGNOSIS — I495 Sick sinus syndrome: Secondary | ICD-10-CM | POA: Diagnosis not present

## 2023-02-19 DIAGNOSIS — I442 Atrioventricular block, complete: Secondary | ICD-10-CM | POA: Diagnosis not present

## 2023-02-19 DIAGNOSIS — N184 Chronic kidney disease, stage 4 (severe): Secondary | ICD-10-CM | POA: Diagnosis not present

## 2023-02-19 DIAGNOSIS — M86272 Subacute osteomyelitis, left ankle and foot: Secondary | ICD-10-CM | POA: Diagnosis not present

## 2023-02-19 DIAGNOSIS — I878 Other specified disorders of veins: Secondary | ICD-10-CM | POA: Diagnosis not present

## 2023-02-19 DIAGNOSIS — I5032 Chronic diastolic (congestive) heart failure: Secondary | ICD-10-CM | POA: Diagnosis not present

## 2023-02-19 DIAGNOSIS — J4489 Other specified chronic obstructive pulmonary disease: Secondary | ICD-10-CM | POA: Diagnosis not present

## 2023-02-19 DIAGNOSIS — E039 Hypothyroidism, unspecified: Secondary | ICD-10-CM | POA: Diagnosis not present

## 2023-02-19 DIAGNOSIS — Z556 Problems related to health literacy: Secondary | ICD-10-CM | POA: Diagnosis not present

## 2023-02-19 DIAGNOSIS — I130 Hypertensive heart and chronic kidney disease with heart failure and stage 1 through stage 4 chronic kidney disease, or unspecified chronic kidney disease: Secondary | ICD-10-CM | POA: Diagnosis not present

## 2023-02-19 DIAGNOSIS — Z794 Long term (current) use of insulin: Secondary | ICD-10-CM | POA: Diagnosis not present

## 2023-02-19 DIAGNOSIS — I501 Left ventricular failure: Secondary | ICD-10-CM | POA: Diagnosis not present

## 2023-02-19 DIAGNOSIS — H409 Unspecified glaucoma: Secondary | ICD-10-CM | POA: Diagnosis not present

## 2023-02-19 DIAGNOSIS — J9610 Chronic respiratory failure, unspecified whether with hypoxia or hypercapnia: Secondary | ICD-10-CM | POA: Diagnosis not present

## 2023-02-19 DIAGNOSIS — E213 Hyperparathyroidism, unspecified: Secondary | ICD-10-CM | POA: Diagnosis not present

## 2023-02-19 DIAGNOSIS — I429 Cardiomyopathy, unspecified: Secondary | ICD-10-CM | POA: Diagnosis not present

## 2023-02-19 DIAGNOSIS — R1311 Dysphagia, oral phase: Secondary | ICD-10-CM | POA: Diagnosis not present

## 2023-02-21 ENCOUNTER — Ambulatory Visit (HOSPITAL_BASED_OUTPATIENT_CLINIC_OR_DEPARTMENT_OTHER): Payer: Medicare Other | Admitting: General Surgery

## 2023-02-25 DIAGNOSIS — E039 Hypothyroidism, unspecified: Secondary | ICD-10-CM | POA: Diagnosis not present

## 2023-02-25 DIAGNOSIS — I5032 Chronic diastolic (congestive) heart failure: Secondary | ICD-10-CM | POA: Diagnosis not present

## 2023-02-25 DIAGNOSIS — Z794 Long term (current) use of insulin: Secondary | ICD-10-CM | POA: Diagnosis not present

## 2023-02-25 DIAGNOSIS — E1122 Type 2 diabetes mellitus with diabetic chronic kidney disease: Secondary | ICD-10-CM | POA: Diagnosis not present

## 2023-02-25 DIAGNOSIS — R1311 Dysphagia, oral phase: Secondary | ICD-10-CM | POA: Diagnosis not present

## 2023-02-25 DIAGNOSIS — I130 Hypertensive heart and chronic kidney disease with heart failure and stage 1 through stage 4 chronic kidney disease, or unspecified chronic kidney disease: Secondary | ICD-10-CM | POA: Diagnosis not present

## 2023-02-25 DIAGNOSIS — N184 Chronic kidney disease, stage 4 (severe): Secondary | ICD-10-CM | POA: Diagnosis not present

## 2023-02-25 DIAGNOSIS — Z556 Problems related to health literacy: Secondary | ICD-10-CM | POA: Diagnosis not present

## 2023-02-25 DIAGNOSIS — J4489 Other specified chronic obstructive pulmonary disease: Secondary | ICD-10-CM | POA: Diagnosis not present

## 2023-02-25 DIAGNOSIS — H409 Unspecified glaucoma: Secondary | ICD-10-CM | POA: Diagnosis not present

## 2023-02-25 DIAGNOSIS — E213 Hyperparathyroidism, unspecified: Secondary | ICD-10-CM | POA: Diagnosis not present

## 2023-02-25 DIAGNOSIS — J9610 Chronic respiratory failure, unspecified whether with hypoxia or hypercapnia: Secondary | ICD-10-CM | POA: Diagnosis not present

## 2023-02-25 DIAGNOSIS — I878 Other specified disorders of veins: Secondary | ICD-10-CM | POA: Diagnosis not present

## 2023-02-25 DIAGNOSIS — I480 Paroxysmal atrial fibrillation: Secondary | ICD-10-CM | POA: Diagnosis not present

## 2023-02-25 DIAGNOSIS — E7800 Pure hypercholesterolemia, unspecified: Secondary | ICD-10-CM | POA: Diagnosis not present

## 2023-02-25 DIAGNOSIS — M86272 Subacute osteomyelitis, left ankle and foot: Secondary | ICD-10-CM | POA: Diagnosis not present

## 2023-02-25 DIAGNOSIS — M1990 Unspecified osteoarthritis, unspecified site: Secondary | ICD-10-CM | POA: Diagnosis not present

## 2023-02-25 DIAGNOSIS — I442 Atrioventricular block, complete: Secondary | ICD-10-CM | POA: Diagnosis not present

## 2023-02-25 DIAGNOSIS — I501 Left ventricular failure: Secondary | ICD-10-CM | POA: Diagnosis not present

## 2023-02-25 DIAGNOSIS — I4892 Unspecified atrial flutter: Secondary | ICD-10-CM | POA: Diagnosis not present

## 2023-02-25 DIAGNOSIS — E1169 Type 2 diabetes mellitus with other specified complication: Secondary | ICD-10-CM | POA: Diagnosis not present

## 2023-02-25 DIAGNOSIS — I429 Cardiomyopathy, unspecified: Secondary | ICD-10-CM | POA: Diagnosis not present

## 2023-02-25 DIAGNOSIS — I495 Sick sinus syndrome: Secondary | ICD-10-CM | POA: Diagnosis not present

## 2023-02-25 DIAGNOSIS — Z950 Presence of cardiac pacemaker: Secondary | ICD-10-CM | POA: Diagnosis not present

## 2023-02-26 ENCOUNTER — Ambulatory Visit (HOSPITAL_COMMUNITY): Payer: Medicare Other | Attending: Physician Assistant

## 2023-02-26 ENCOUNTER — Encounter: Payer: Medicare Other | Admitting: Vascular Surgery

## 2023-02-28 DIAGNOSIS — E039 Hypothyroidism, unspecified: Secondary | ICD-10-CM | POA: Diagnosis not present

## 2023-02-28 DIAGNOSIS — I442 Atrioventricular block, complete: Secondary | ICD-10-CM | POA: Diagnosis not present

## 2023-02-28 DIAGNOSIS — E7800 Pure hypercholesterolemia, unspecified: Secondary | ICD-10-CM | POA: Diagnosis not present

## 2023-02-28 DIAGNOSIS — M86272 Subacute osteomyelitis, left ankle and foot: Secondary | ICD-10-CM | POA: Diagnosis not present

## 2023-02-28 DIAGNOSIS — H409 Unspecified glaucoma: Secondary | ICD-10-CM | POA: Diagnosis not present

## 2023-02-28 DIAGNOSIS — Z556 Problems related to health literacy: Secondary | ICD-10-CM | POA: Diagnosis not present

## 2023-02-28 DIAGNOSIS — E1169 Type 2 diabetes mellitus with other specified complication: Secondary | ICD-10-CM | POA: Diagnosis not present

## 2023-02-28 DIAGNOSIS — I130 Hypertensive heart and chronic kidney disease with heart failure and stage 1 through stage 4 chronic kidney disease, or unspecified chronic kidney disease: Secondary | ICD-10-CM | POA: Diagnosis not present

## 2023-02-28 DIAGNOSIS — I501 Left ventricular failure: Secondary | ICD-10-CM | POA: Diagnosis not present

## 2023-02-28 DIAGNOSIS — J4489 Other specified chronic obstructive pulmonary disease: Secondary | ICD-10-CM | POA: Diagnosis not present

## 2023-02-28 DIAGNOSIS — I878 Other specified disorders of veins: Secondary | ICD-10-CM | POA: Diagnosis not present

## 2023-02-28 DIAGNOSIS — R1311 Dysphagia, oral phase: Secondary | ICD-10-CM | POA: Diagnosis not present

## 2023-02-28 DIAGNOSIS — Z794 Long term (current) use of insulin: Secondary | ICD-10-CM | POA: Diagnosis not present

## 2023-02-28 DIAGNOSIS — I480 Paroxysmal atrial fibrillation: Secondary | ICD-10-CM | POA: Diagnosis not present

## 2023-02-28 DIAGNOSIS — Z950 Presence of cardiac pacemaker: Secondary | ICD-10-CM | POA: Diagnosis not present

## 2023-02-28 DIAGNOSIS — J9610 Chronic respiratory failure, unspecified whether with hypoxia or hypercapnia: Secondary | ICD-10-CM | POA: Diagnosis not present

## 2023-02-28 DIAGNOSIS — E1122 Type 2 diabetes mellitus with diabetic chronic kidney disease: Secondary | ICD-10-CM | POA: Diagnosis not present

## 2023-02-28 DIAGNOSIS — I5032 Chronic diastolic (congestive) heart failure: Secondary | ICD-10-CM | POA: Diagnosis not present

## 2023-02-28 DIAGNOSIS — I429 Cardiomyopathy, unspecified: Secondary | ICD-10-CM | POA: Diagnosis not present

## 2023-02-28 DIAGNOSIS — E213 Hyperparathyroidism, unspecified: Secondary | ICD-10-CM | POA: Diagnosis not present

## 2023-02-28 DIAGNOSIS — I495 Sick sinus syndrome: Secondary | ICD-10-CM | POA: Diagnosis not present

## 2023-02-28 DIAGNOSIS — I4892 Unspecified atrial flutter: Secondary | ICD-10-CM | POA: Diagnosis not present

## 2023-02-28 DIAGNOSIS — M1990 Unspecified osteoarthritis, unspecified site: Secondary | ICD-10-CM | POA: Diagnosis not present

## 2023-02-28 DIAGNOSIS — N184 Chronic kidney disease, stage 4 (severe): Secondary | ICD-10-CM | POA: Diagnosis not present

## 2023-03-01 DIAGNOSIS — S81801A Unspecified open wound, right lower leg, initial encounter: Secondary | ICD-10-CM | POA: Diagnosis not present

## 2023-03-01 DIAGNOSIS — S81802A Unspecified open wound, left lower leg, initial encounter: Secondary | ICD-10-CM | POA: Diagnosis not present

## 2023-03-05 DIAGNOSIS — E039 Hypothyroidism, unspecified: Secondary | ICD-10-CM | POA: Diagnosis not present

## 2023-03-05 DIAGNOSIS — E213 Hyperparathyroidism, unspecified: Secondary | ICD-10-CM | POA: Diagnosis not present

## 2023-03-05 DIAGNOSIS — E7800 Pure hypercholesterolemia, unspecified: Secondary | ICD-10-CM | POA: Diagnosis not present

## 2023-03-05 DIAGNOSIS — I5032 Chronic diastolic (congestive) heart failure: Secondary | ICD-10-CM | POA: Diagnosis not present

## 2023-03-05 DIAGNOSIS — E1169 Type 2 diabetes mellitus with other specified complication: Secondary | ICD-10-CM | POA: Diagnosis not present

## 2023-03-05 DIAGNOSIS — I429 Cardiomyopathy, unspecified: Secondary | ICD-10-CM | POA: Diagnosis not present

## 2023-03-05 DIAGNOSIS — Z950 Presence of cardiac pacemaker: Secondary | ICD-10-CM | POA: Diagnosis not present

## 2023-03-05 DIAGNOSIS — J9610 Chronic respiratory failure, unspecified whether with hypoxia or hypercapnia: Secondary | ICD-10-CM | POA: Diagnosis not present

## 2023-03-05 DIAGNOSIS — Z556 Problems related to health literacy: Secondary | ICD-10-CM | POA: Diagnosis not present

## 2023-03-05 DIAGNOSIS — I480 Paroxysmal atrial fibrillation: Secondary | ICD-10-CM | POA: Diagnosis not present

## 2023-03-05 DIAGNOSIS — J4489 Other specified chronic obstructive pulmonary disease: Secondary | ICD-10-CM | POA: Diagnosis not present

## 2023-03-05 DIAGNOSIS — I501 Left ventricular failure: Secondary | ICD-10-CM | POA: Diagnosis not present

## 2023-03-05 DIAGNOSIS — M86272 Subacute osteomyelitis, left ankle and foot: Secondary | ICD-10-CM | POA: Diagnosis not present

## 2023-03-05 DIAGNOSIS — J449 Chronic obstructive pulmonary disease, unspecified: Secondary | ICD-10-CM | POA: Diagnosis not present

## 2023-03-05 DIAGNOSIS — I130 Hypertensive heart and chronic kidney disease with heart failure and stage 1 through stage 4 chronic kidney disease, or unspecified chronic kidney disease: Secondary | ICD-10-CM | POA: Diagnosis not present

## 2023-03-05 DIAGNOSIS — I4892 Unspecified atrial flutter: Secondary | ICD-10-CM | POA: Diagnosis not present

## 2023-03-05 DIAGNOSIS — R1311 Dysphagia, oral phase: Secondary | ICD-10-CM | POA: Diagnosis not present

## 2023-03-05 DIAGNOSIS — I442 Atrioventricular block, complete: Secondary | ICD-10-CM | POA: Diagnosis not present

## 2023-03-05 DIAGNOSIS — I495 Sick sinus syndrome: Secondary | ICD-10-CM | POA: Diagnosis not present

## 2023-03-05 DIAGNOSIS — I878 Other specified disorders of veins: Secondary | ICD-10-CM | POA: Diagnosis not present

## 2023-03-05 DIAGNOSIS — H409 Unspecified glaucoma: Secondary | ICD-10-CM | POA: Diagnosis not present

## 2023-03-05 DIAGNOSIS — M1990 Unspecified osteoarthritis, unspecified site: Secondary | ICD-10-CM | POA: Diagnosis not present

## 2023-03-05 DIAGNOSIS — E1122 Type 2 diabetes mellitus with diabetic chronic kidney disease: Secondary | ICD-10-CM | POA: Diagnosis not present

## 2023-03-05 DIAGNOSIS — Z794 Long term (current) use of insulin: Secondary | ICD-10-CM | POA: Diagnosis not present

## 2023-03-05 DIAGNOSIS — N184 Chronic kidney disease, stage 4 (severe): Secondary | ICD-10-CM | POA: Diagnosis not present

## 2023-03-07 DIAGNOSIS — S3993XA Unspecified injury of pelvis, initial encounter: Secondary | ICD-10-CM | POA: Diagnosis not present

## 2023-03-07 DIAGNOSIS — R278 Other lack of coordination: Secondary | ICD-10-CM | POA: Diagnosis not present

## 2023-03-07 DIAGNOSIS — Z7189 Other specified counseling: Secondary | ICD-10-CM | POA: Diagnosis not present

## 2023-03-07 DIAGNOSIS — R0602 Shortness of breath: Secondary | ICD-10-CM | POA: Diagnosis not present

## 2023-03-07 DIAGNOSIS — N2889 Other specified disorders of kidney and ureter: Secondary | ICD-10-CM | POA: Diagnosis not present

## 2023-03-07 DIAGNOSIS — R296 Repeated falls: Secondary | ICD-10-CM | POA: Diagnosis not present

## 2023-03-07 DIAGNOSIS — D72829 Elevated white blood cell count, unspecified: Secondary | ICD-10-CM | POA: Diagnosis not present

## 2023-03-07 DIAGNOSIS — I272 Pulmonary hypertension, unspecified: Secondary | ICD-10-CM | POA: Diagnosis not present

## 2023-03-07 DIAGNOSIS — E8770 Fluid overload, unspecified: Secondary | ICD-10-CM | POA: Diagnosis not present

## 2023-03-07 DIAGNOSIS — I10 Essential (primary) hypertension: Secondary | ICD-10-CM | POA: Diagnosis not present

## 2023-03-07 DIAGNOSIS — N390 Urinary tract infection, site not specified: Secondary | ICD-10-CM | POA: Diagnosis not present

## 2023-03-07 DIAGNOSIS — I110 Hypertensive heart disease with heart failure: Secondary | ICD-10-CM | POA: Diagnosis not present

## 2023-03-07 DIAGNOSIS — E1122 Type 2 diabetes mellitus with diabetic chronic kidney disease: Secondary | ICD-10-CM | POA: Diagnosis not present

## 2023-03-07 DIAGNOSIS — W1800XA Striking against unspecified object with subsequent fall, initial encounter: Secondary | ICD-10-CM | POA: Diagnosis not present

## 2023-03-07 DIAGNOSIS — R488 Other symbolic dysfunctions: Secondary | ICD-10-CM | POA: Diagnosis not present

## 2023-03-07 DIAGNOSIS — R531 Weakness: Secondary | ICD-10-CM | POA: Diagnosis not present

## 2023-03-07 DIAGNOSIS — E119 Type 2 diabetes mellitus without complications: Secondary | ICD-10-CM | POA: Diagnosis not present

## 2023-03-07 DIAGNOSIS — E039 Hypothyroidism, unspecified: Secondary | ICD-10-CM | POA: Diagnosis not present

## 2023-03-07 DIAGNOSIS — I129 Hypertensive chronic kidney disease with stage 1 through stage 4 chronic kidney disease, or unspecified chronic kidney disease: Secondary | ICD-10-CM | POA: Diagnosis not present

## 2023-03-07 DIAGNOSIS — J9601 Acute respiratory failure with hypoxia: Secondary | ICD-10-CM | POA: Diagnosis not present

## 2023-03-07 DIAGNOSIS — N184 Chronic kidney disease, stage 4 (severe): Secondary | ICD-10-CM | POA: Diagnosis not present

## 2023-03-07 DIAGNOSIS — R410 Disorientation, unspecified: Secondary | ICD-10-CM | POA: Diagnosis not present

## 2023-03-07 DIAGNOSIS — I5023 Acute on chronic systolic (congestive) heart failure: Secondary | ICD-10-CM | POA: Diagnosis not present

## 2023-03-07 DIAGNOSIS — W19XXXA Unspecified fall, initial encounter: Secondary | ICD-10-CM | POA: Diagnosis not present

## 2023-03-07 DIAGNOSIS — I083 Combined rheumatic disorders of mitral, aortic and tricuspid valves: Secondary | ICD-10-CM | POA: Diagnosis not present

## 2023-03-07 DIAGNOSIS — Z79899 Other long term (current) drug therapy: Secondary | ICD-10-CM | POA: Diagnosis not present

## 2023-03-07 DIAGNOSIS — Z794 Long term (current) use of insulin: Secondary | ICD-10-CM | POA: Diagnosis not present

## 2023-03-07 DIAGNOSIS — Z7409 Other reduced mobility: Secondary | ICD-10-CM | POA: Diagnosis not present

## 2023-03-07 DIAGNOSIS — Z95818 Presence of other cardiac implants and grafts: Secondary | ICD-10-CM | POA: Diagnosis not present

## 2023-03-07 DIAGNOSIS — E785 Hyperlipidemia, unspecified: Secondary | ICD-10-CM | POA: Diagnosis not present

## 2023-03-07 DIAGNOSIS — Z950 Presence of cardiac pacemaker: Secondary | ICD-10-CM | POA: Diagnosis not present

## 2023-03-07 DIAGNOSIS — J810 Acute pulmonary edema: Secondary | ICD-10-CM | POA: Diagnosis not present

## 2023-03-07 DIAGNOSIS — J811 Chronic pulmonary edema: Secondary | ICD-10-CM | POA: Diagnosis not present

## 2023-03-07 DIAGNOSIS — N3000 Acute cystitis without hematuria: Secondary | ICD-10-CM | POA: Diagnosis not present

## 2023-03-07 DIAGNOSIS — R54 Age-related physical debility: Secondary | ICD-10-CM | POA: Diagnosis not present

## 2023-03-07 DIAGNOSIS — M6281 Muscle weakness (generalized): Secondary | ICD-10-CM | POA: Diagnosis not present

## 2023-03-07 DIAGNOSIS — R5383 Other fatigue: Secondary | ICD-10-CM | POA: Diagnosis not present

## 2023-03-07 DIAGNOSIS — Z515 Encounter for palliative care: Secondary | ICD-10-CM | POA: Diagnosis not present

## 2023-03-07 DIAGNOSIS — Z741 Need for assistance with personal care: Secondary | ICD-10-CM | POA: Diagnosis not present

## 2023-03-07 DIAGNOSIS — E1165 Type 2 diabetes mellitus with hyperglycemia: Secondary | ICD-10-CM | POA: Diagnosis not present

## 2023-03-07 DIAGNOSIS — R262 Difficulty in walking, not elsewhere classified: Secondary | ICD-10-CM | POA: Diagnosis not present

## 2023-03-07 DIAGNOSIS — S81802A Unspecified open wound, left lower leg, initial encounter: Secondary | ICD-10-CM | POA: Diagnosis not present

## 2023-03-11 ENCOUNTER — Ambulatory Visit: Payer: Medicare Other

## 2023-03-15 DIAGNOSIS — M6281 Muscle weakness (generalized): Secondary | ICD-10-CM | POA: Diagnosis not present

## 2023-03-15 DIAGNOSIS — R278 Other lack of coordination: Secondary | ICD-10-CM | POA: Diagnosis not present

## 2023-03-15 DIAGNOSIS — I10 Essential (primary) hypertension: Secondary | ICD-10-CM | POA: Diagnosis not present

## 2023-03-15 DIAGNOSIS — Z7189 Other specified counseling: Secondary | ICD-10-CM | POA: Diagnosis not present

## 2023-03-15 DIAGNOSIS — J810 Acute pulmonary edema: Secondary | ICD-10-CM | POA: Diagnosis not present

## 2023-03-15 DIAGNOSIS — Z794 Long term (current) use of insulin: Secondary | ICD-10-CM | POA: Diagnosis not present

## 2023-03-15 DIAGNOSIS — N1831 Chronic kidney disease, stage 3a: Secondary | ICD-10-CM | POA: Diagnosis not present

## 2023-03-15 DIAGNOSIS — N3000 Acute cystitis without hematuria: Secondary | ICD-10-CM | POA: Diagnosis not present

## 2023-03-15 DIAGNOSIS — R627 Adult failure to thrive: Secondary | ICD-10-CM | POA: Diagnosis not present

## 2023-03-15 DIAGNOSIS — Z9181 History of falling: Secondary | ICD-10-CM | POA: Diagnosis not present

## 2023-03-15 DIAGNOSIS — R296 Repeated falls: Secondary | ICD-10-CM | POA: Diagnosis not present

## 2023-03-15 DIAGNOSIS — Z741 Need for assistance with personal care: Secondary | ICD-10-CM | POA: Diagnosis not present

## 2023-03-15 DIAGNOSIS — W19XXXD Unspecified fall, subsequent encounter: Secondary | ICD-10-CM | POA: Diagnosis not present

## 2023-03-15 DIAGNOSIS — I5032 Chronic diastolic (congestive) heart failure: Secondary | ICD-10-CM | POA: Diagnosis not present

## 2023-03-15 DIAGNOSIS — N184 Chronic kidney disease, stage 4 (severe): Secondary | ICD-10-CM | POA: Diagnosis not present

## 2023-03-15 DIAGNOSIS — E1165 Type 2 diabetes mellitus with hyperglycemia: Secondary | ICD-10-CM | POA: Diagnosis not present

## 2023-03-15 DIAGNOSIS — E039 Hypothyroidism, unspecified: Secondary | ICD-10-CM | POA: Diagnosis not present

## 2023-03-15 DIAGNOSIS — Z515 Encounter for palliative care: Secondary | ICD-10-CM | POA: Diagnosis not present

## 2023-03-15 DIAGNOSIS — R262 Difficulty in walking, not elsewhere classified: Secondary | ICD-10-CM | POA: Diagnosis not present

## 2023-03-15 DIAGNOSIS — R488 Other symbolic dysfunctions: Secondary | ICD-10-CM | POA: Diagnosis not present

## 2023-03-15 DIAGNOSIS — E1122 Type 2 diabetes mellitus with diabetic chronic kidney disease: Secondary | ICD-10-CM | POA: Diagnosis not present

## 2023-03-15 DIAGNOSIS — J449 Chronic obstructive pulmonary disease, unspecified: Secondary | ICD-10-CM | POA: Diagnosis not present

## 2023-03-15 DIAGNOSIS — E785 Hyperlipidemia, unspecified: Secondary | ICD-10-CM | POA: Diagnosis not present

## 2023-03-18 DIAGNOSIS — Z794 Long term (current) use of insulin: Secondary | ICD-10-CM | POA: Diagnosis not present

## 2023-03-18 DIAGNOSIS — E1122 Type 2 diabetes mellitus with diabetic chronic kidney disease: Secondary | ICD-10-CM | POA: Diagnosis not present

## 2023-03-18 DIAGNOSIS — N1831 Chronic kidney disease, stage 3a: Secondary | ICD-10-CM | POA: Diagnosis not present

## 2023-03-18 DIAGNOSIS — R627 Adult failure to thrive: Secondary | ICD-10-CM | POA: Diagnosis not present

## 2023-03-18 DIAGNOSIS — W19XXXD Unspecified fall, subsequent encounter: Secondary | ICD-10-CM | POA: Diagnosis not present

## 2023-03-19 DIAGNOSIS — J449 Chronic obstructive pulmonary disease, unspecified: Secondary | ICD-10-CM | POA: Diagnosis not present

## 2023-03-19 DIAGNOSIS — I5032 Chronic diastolic (congestive) heart failure: Secondary | ICD-10-CM | POA: Diagnosis not present

## 2023-03-20 DIAGNOSIS — Z9181 History of falling: Secondary | ICD-10-CM | POA: Diagnosis not present

## 2023-03-27 DIAGNOSIS — Z9181 History of falling: Secondary | ICD-10-CM | POA: Diagnosis not present

## 2023-04-02 DIAGNOSIS — N1831 Chronic kidney disease, stage 3a: Secondary | ICD-10-CM | POA: Diagnosis not present

## 2023-04-02 DIAGNOSIS — I270 Primary pulmonary hypertension: Secondary | ICD-10-CM | POA: Diagnosis not present

## 2023-04-02 DIAGNOSIS — J810 Acute pulmonary edema: Secondary | ICD-10-CM | POA: Diagnosis not present

## 2023-04-02 DIAGNOSIS — M6281 Muscle weakness (generalized): Secondary | ICD-10-CM | POA: Diagnosis not present

## 2023-04-02 DIAGNOSIS — E1122 Type 2 diabetes mellitus with diabetic chronic kidney disease: Secondary | ICD-10-CM | POA: Diagnosis not present

## 2023-04-10 ENCOUNTER — Other Ambulatory Visit: Payer: Self-pay | Admitting: *Deleted

## 2023-04-10 DIAGNOSIS — M7989 Other specified soft tissue disorders: Secondary | ICD-10-CM

## 2023-04-12 DIAGNOSIS — W19XXXD Unspecified fall, subsequent encounter: Secondary | ICD-10-CM | POA: Diagnosis not present

## 2023-04-12 DIAGNOSIS — N1831 Chronic kidney disease, stage 3a: Secondary | ICD-10-CM | POA: Diagnosis not present

## 2023-04-12 DIAGNOSIS — E785 Hyperlipidemia, unspecified: Secondary | ICD-10-CM | POA: Diagnosis not present

## 2023-04-12 DIAGNOSIS — E1122 Type 2 diabetes mellitus with diabetic chronic kidney disease: Secondary | ICD-10-CM | POA: Diagnosis not present

## 2023-04-12 DIAGNOSIS — E1165 Type 2 diabetes mellitus with hyperglycemia: Secondary | ICD-10-CM | POA: Diagnosis not present

## 2023-04-12 DIAGNOSIS — R627 Adult failure to thrive: Secondary | ICD-10-CM | POA: Diagnosis not present

## 2023-04-12 DIAGNOSIS — I4892 Unspecified atrial flutter: Secondary | ICD-10-CM | POA: Diagnosis not present

## 2023-04-12 DIAGNOSIS — J9611 Chronic respiratory failure with hypoxia: Secondary | ICD-10-CM | POA: Diagnosis not present

## 2023-04-12 DIAGNOSIS — I480 Paroxysmal atrial fibrillation: Secondary | ICD-10-CM | POA: Diagnosis not present

## 2023-04-12 DIAGNOSIS — I5032 Chronic diastolic (congestive) heart failure: Secondary | ICD-10-CM | POA: Diagnosis not present

## 2023-04-12 DIAGNOSIS — N1832 Chronic kidney disease, stage 3b: Secondary | ICD-10-CM | POA: Diagnosis not present

## 2023-04-12 DIAGNOSIS — I130 Hypertensive heart and chronic kidney disease with heart failure and stage 1 through stage 4 chronic kidney disease, or unspecified chronic kidney disease: Secondary | ICD-10-CM | POA: Diagnosis not present

## 2023-04-12 DIAGNOSIS — I10 Essential (primary) hypertension: Secondary | ICD-10-CM | POA: Diagnosis not present

## 2023-04-12 DIAGNOSIS — I429 Cardiomyopathy, unspecified: Secondary | ICD-10-CM | POA: Diagnosis not present

## 2023-04-12 DIAGNOSIS — I495 Sick sinus syndrome: Secondary | ICD-10-CM | POA: Diagnosis not present

## 2023-04-12 DIAGNOSIS — K219 Gastro-esophageal reflux disease without esophagitis: Secondary | ICD-10-CM | POA: Diagnosis not present

## 2023-04-15 DIAGNOSIS — E1122 Type 2 diabetes mellitus with diabetic chronic kidney disease: Secondary | ICD-10-CM | POA: Diagnosis not present

## 2023-04-15 DIAGNOSIS — N1831 Chronic kidney disease, stage 3a: Secondary | ICD-10-CM | POA: Diagnosis not present

## 2023-04-15 DIAGNOSIS — W19XXXD Unspecified fall, subsequent encounter: Secondary | ICD-10-CM | POA: Diagnosis not present

## 2023-04-15 DIAGNOSIS — R627 Adult failure to thrive: Secondary | ICD-10-CM | POA: Diagnosis not present

## 2023-04-15 DIAGNOSIS — I10 Essential (primary) hypertension: Secondary | ICD-10-CM | POA: Diagnosis not present

## 2023-04-16 DIAGNOSIS — Z87440 Personal history of urinary (tract) infections: Secondary | ICD-10-CM | POA: Diagnosis not present

## 2023-04-16 DIAGNOSIS — Z9981 Dependence on supplemental oxygen: Secondary | ICD-10-CM | POA: Diagnosis not present

## 2023-04-16 DIAGNOSIS — R234 Changes in skin texture: Secondary | ICD-10-CM | POA: Diagnosis not present

## 2023-04-16 DIAGNOSIS — I130 Hypertensive heart and chronic kidney disease with heart failure and stage 1 through stage 4 chronic kidney disease, or unspecified chronic kidney disease: Secondary | ICD-10-CM | POA: Diagnosis not present

## 2023-04-16 DIAGNOSIS — I10 Essential (primary) hypertension: Secondary | ICD-10-CM | POA: Diagnosis not present

## 2023-04-16 DIAGNOSIS — I495 Sick sinus syndrome: Secondary | ICD-10-CM | POA: Diagnosis not present

## 2023-04-16 DIAGNOSIS — J9611 Chronic respiratory failure with hypoxia: Secondary | ICD-10-CM | POA: Diagnosis not present

## 2023-04-16 DIAGNOSIS — I5032 Chronic diastolic (congestive) heart failure: Secondary | ICD-10-CM | POA: Diagnosis not present

## 2023-04-16 DIAGNOSIS — N1831 Chronic kidney disease, stage 3a: Secondary | ICD-10-CM | POA: Diagnosis not present

## 2023-04-16 DIAGNOSIS — E785 Hyperlipidemia, unspecified: Secondary | ICD-10-CM | POA: Diagnosis not present

## 2023-04-16 DIAGNOSIS — E039 Hypothyroidism, unspecified: Secondary | ICD-10-CM | POA: Diagnosis not present

## 2023-04-16 DIAGNOSIS — R627 Adult failure to thrive: Secondary | ICD-10-CM | POA: Diagnosis not present

## 2023-04-16 DIAGNOSIS — N1832 Chronic kidney disease, stage 3b: Secondary | ICD-10-CM | POA: Diagnosis not present

## 2023-04-16 DIAGNOSIS — I429 Cardiomyopathy, unspecified: Secondary | ICD-10-CM | POA: Diagnosis not present

## 2023-04-16 DIAGNOSIS — E1165 Type 2 diabetes mellitus with hyperglycemia: Secondary | ICD-10-CM | POA: Diagnosis not present

## 2023-04-16 DIAGNOSIS — W19XXXD Unspecified fall, subsequent encounter: Secondary | ICD-10-CM | POA: Diagnosis not present

## 2023-04-16 DIAGNOSIS — I480 Paroxysmal atrial fibrillation: Secondary | ICD-10-CM | POA: Diagnosis not present

## 2023-04-16 DIAGNOSIS — E1122 Type 2 diabetes mellitus with diabetic chronic kidney disease: Secondary | ICD-10-CM | POA: Diagnosis not present

## 2023-04-16 DIAGNOSIS — I4892 Unspecified atrial flutter: Secondary | ICD-10-CM | POA: Diagnosis not present

## 2023-04-16 DIAGNOSIS — K219 Gastro-esophageal reflux disease without esophagitis: Secondary | ICD-10-CM | POA: Diagnosis not present

## 2023-04-16 DIAGNOSIS — I878 Other specified disorders of veins: Secondary | ICD-10-CM | POA: Diagnosis not present

## 2023-04-16 DIAGNOSIS — J4489 Other specified chronic obstructive pulmonary disease: Secondary | ICD-10-CM | POA: Diagnosis not present

## 2023-04-19 ENCOUNTER — Encounter: Payer: Medicare Other | Admitting: Vascular Surgery

## 2023-04-19 ENCOUNTER — Encounter (HOSPITAL_COMMUNITY): Payer: Medicare Other

## 2023-04-19 DIAGNOSIS — R601 Generalized edema: Secondary | ICD-10-CM | POA: Diagnosis not present

## 2023-04-19 DIAGNOSIS — R262 Difficulty in walking, not elsewhere classified: Secondary | ICD-10-CM | POA: Diagnosis not present

## 2023-04-19 DIAGNOSIS — M79662 Pain in left lower leg: Secondary | ICD-10-CM | POA: Diagnosis not present

## 2023-04-19 DIAGNOSIS — M21372 Foot drop, left foot: Secondary | ICD-10-CM | POA: Diagnosis not present

## 2023-04-19 DIAGNOSIS — Q666 Other congenital valgus deformities of feet: Secondary | ICD-10-CM | POA: Diagnosis not present

## 2023-04-19 DIAGNOSIS — M79661 Pain in right lower leg: Secondary | ICD-10-CM | POA: Diagnosis not present

## 2023-04-19 DIAGNOSIS — B351 Tinea unguium: Secondary | ICD-10-CM | POA: Diagnosis not present

## 2023-04-19 DIAGNOSIS — M21371 Foot drop, right foot: Secondary | ICD-10-CM | POA: Diagnosis not present

## 2023-04-20 DIAGNOSIS — I130 Hypertensive heart and chronic kidney disease with heart failure and stage 1 through stage 4 chronic kidney disease, or unspecified chronic kidney disease: Secondary | ICD-10-CM | POA: Diagnosis not present

## 2023-04-20 DIAGNOSIS — J4489 Other specified chronic obstructive pulmonary disease: Secondary | ICD-10-CM | POA: Diagnosis not present

## 2023-04-20 DIAGNOSIS — I480 Paroxysmal atrial fibrillation: Secondary | ICD-10-CM | POA: Diagnosis not present

## 2023-04-20 DIAGNOSIS — Z87440 Personal history of urinary (tract) infections: Secondary | ICD-10-CM | POA: Diagnosis not present

## 2023-04-20 DIAGNOSIS — E1165 Type 2 diabetes mellitus with hyperglycemia: Secondary | ICD-10-CM | POA: Diagnosis not present

## 2023-04-20 DIAGNOSIS — E039 Hypothyroidism, unspecified: Secondary | ICD-10-CM | POA: Diagnosis not present

## 2023-04-20 DIAGNOSIS — W19XXXD Unspecified fall, subsequent encounter: Secondary | ICD-10-CM | POA: Diagnosis not present

## 2023-04-20 DIAGNOSIS — N1832 Chronic kidney disease, stage 3b: Secondary | ICD-10-CM | POA: Diagnosis not present

## 2023-04-20 DIAGNOSIS — K219 Gastro-esophageal reflux disease without esophagitis: Secondary | ICD-10-CM | POA: Diagnosis not present

## 2023-04-20 DIAGNOSIS — I429 Cardiomyopathy, unspecified: Secondary | ICD-10-CM | POA: Diagnosis not present

## 2023-04-20 DIAGNOSIS — I495 Sick sinus syndrome: Secondary | ICD-10-CM | POA: Diagnosis not present

## 2023-04-20 DIAGNOSIS — I4892 Unspecified atrial flutter: Secondary | ICD-10-CM | POA: Diagnosis not present

## 2023-04-20 DIAGNOSIS — R234 Changes in skin texture: Secondary | ICD-10-CM | POA: Diagnosis not present

## 2023-04-20 DIAGNOSIS — R627 Adult failure to thrive: Secondary | ICD-10-CM | POA: Diagnosis not present

## 2023-04-20 DIAGNOSIS — E1122 Type 2 diabetes mellitus with diabetic chronic kidney disease: Secondary | ICD-10-CM | POA: Diagnosis not present

## 2023-04-20 DIAGNOSIS — I5032 Chronic diastolic (congestive) heart failure: Secondary | ICD-10-CM | POA: Diagnosis not present

## 2023-04-20 DIAGNOSIS — Z9981 Dependence on supplemental oxygen: Secondary | ICD-10-CM | POA: Diagnosis not present

## 2023-04-20 DIAGNOSIS — I878 Other specified disorders of veins: Secondary | ICD-10-CM | POA: Diagnosis not present

## 2023-04-20 DIAGNOSIS — E785 Hyperlipidemia, unspecified: Secondary | ICD-10-CM | POA: Diagnosis not present

## 2023-04-20 DIAGNOSIS — J9611 Chronic respiratory failure with hypoxia: Secondary | ICD-10-CM | POA: Diagnosis not present

## 2023-04-24 DIAGNOSIS — J9611 Chronic respiratory failure with hypoxia: Secondary | ICD-10-CM | POA: Diagnosis not present

## 2023-04-24 DIAGNOSIS — Z87440 Personal history of urinary (tract) infections: Secondary | ICD-10-CM | POA: Diagnosis not present

## 2023-04-24 DIAGNOSIS — R234 Changes in skin texture: Secondary | ICD-10-CM | POA: Diagnosis not present

## 2023-04-24 DIAGNOSIS — J4489 Other specified chronic obstructive pulmonary disease: Secondary | ICD-10-CM | POA: Diagnosis not present

## 2023-04-24 DIAGNOSIS — E1122 Type 2 diabetes mellitus with diabetic chronic kidney disease: Secondary | ICD-10-CM | POA: Diagnosis not present

## 2023-04-24 DIAGNOSIS — N1832 Chronic kidney disease, stage 3b: Secondary | ICD-10-CM | POA: Diagnosis not present

## 2023-04-24 DIAGNOSIS — W19XXXD Unspecified fall, subsequent encounter: Secondary | ICD-10-CM | POA: Diagnosis not present

## 2023-04-24 DIAGNOSIS — E1165 Type 2 diabetes mellitus with hyperglycemia: Secondary | ICD-10-CM | POA: Diagnosis not present

## 2023-04-24 DIAGNOSIS — I480 Paroxysmal atrial fibrillation: Secondary | ICD-10-CM | POA: Diagnosis not present

## 2023-04-24 DIAGNOSIS — K219 Gastro-esophageal reflux disease without esophagitis: Secondary | ICD-10-CM | POA: Diagnosis not present

## 2023-04-24 DIAGNOSIS — I4892 Unspecified atrial flutter: Secondary | ICD-10-CM | POA: Diagnosis not present

## 2023-04-24 DIAGNOSIS — I495 Sick sinus syndrome: Secondary | ICD-10-CM | POA: Diagnosis not present

## 2023-04-24 DIAGNOSIS — R627 Adult failure to thrive: Secondary | ICD-10-CM | POA: Diagnosis not present

## 2023-04-24 DIAGNOSIS — E785 Hyperlipidemia, unspecified: Secondary | ICD-10-CM | POA: Diagnosis not present

## 2023-04-24 DIAGNOSIS — I130 Hypertensive heart and chronic kidney disease with heart failure and stage 1 through stage 4 chronic kidney disease, or unspecified chronic kidney disease: Secondary | ICD-10-CM | POA: Diagnosis not present

## 2023-04-24 DIAGNOSIS — I5032 Chronic diastolic (congestive) heart failure: Secondary | ICD-10-CM | POA: Diagnosis not present

## 2023-04-24 DIAGNOSIS — E039 Hypothyroidism, unspecified: Secondary | ICD-10-CM | POA: Diagnosis not present

## 2023-04-24 DIAGNOSIS — I429 Cardiomyopathy, unspecified: Secondary | ICD-10-CM | POA: Diagnosis not present

## 2023-04-24 DIAGNOSIS — I878 Other specified disorders of veins: Secondary | ICD-10-CM | POA: Diagnosis not present

## 2023-04-24 DIAGNOSIS — Z9981 Dependence on supplemental oxygen: Secondary | ICD-10-CM | POA: Diagnosis not present

## 2023-04-27 DIAGNOSIS — W19XXXD Unspecified fall, subsequent encounter: Secondary | ICD-10-CM | POA: Diagnosis not present

## 2023-04-27 DIAGNOSIS — I130 Hypertensive heart and chronic kidney disease with heart failure and stage 1 through stage 4 chronic kidney disease, or unspecified chronic kidney disease: Secondary | ICD-10-CM | POA: Diagnosis not present

## 2023-04-27 DIAGNOSIS — Z9981 Dependence on supplemental oxygen: Secondary | ICD-10-CM | POA: Diagnosis not present

## 2023-04-27 DIAGNOSIS — I495 Sick sinus syndrome: Secondary | ICD-10-CM | POA: Diagnosis not present

## 2023-04-27 DIAGNOSIS — I480 Paroxysmal atrial fibrillation: Secondary | ICD-10-CM | POA: Diagnosis not present

## 2023-04-27 DIAGNOSIS — I4892 Unspecified atrial flutter: Secondary | ICD-10-CM | POA: Diagnosis not present

## 2023-04-27 DIAGNOSIS — I429 Cardiomyopathy, unspecified: Secondary | ICD-10-CM | POA: Diagnosis not present

## 2023-04-27 DIAGNOSIS — I5032 Chronic diastolic (congestive) heart failure: Secondary | ICD-10-CM | POA: Diagnosis not present

## 2023-04-27 DIAGNOSIS — K219 Gastro-esophageal reflux disease without esophagitis: Secondary | ICD-10-CM | POA: Diagnosis not present

## 2023-04-27 DIAGNOSIS — E785 Hyperlipidemia, unspecified: Secondary | ICD-10-CM | POA: Diagnosis not present

## 2023-04-27 DIAGNOSIS — J4489 Other specified chronic obstructive pulmonary disease: Secondary | ICD-10-CM | POA: Diagnosis not present

## 2023-04-27 DIAGNOSIS — R234 Changes in skin texture: Secondary | ICD-10-CM | POA: Diagnosis not present

## 2023-04-27 DIAGNOSIS — Z87440 Personal history of urinary (tract) infections: Secondary | ICD-10-CM | POA: Diagnosis not present

## 2023-04-27 DIAGNOSIS — E1165 Type 2 diabetes mellitus with hyperglycemia: Secondary | ICD-10-CM | POA: Diagnosis not present

## 2023-04-27 DIAGNOSIS — R627 Adult failure to thrive: Secondary | ICD-10-CM | POA: Diagnosis not present

## 2023-04-27 DIAGNOSIS — I878 Other specified disorders of veins: Secondary | ICD-10-CM | POA: Diagnosis not present

## 2023-04-27 DIAGNOSIS — N1832 Chronic kidney disease, stage 3b: Secondary | ICD-10-CM | POA: Diagnosis not present

## 2023-04-27 DIAGNOSIS — E1122 Type 2 diabetes mellitus with diabetic chronic kidney disease: Secondary | ICD-10-CM | POA: Diagnosis not present

## 2023-04-27 DIAGNOSIS — E039 Hypothyroidism, unspecified: Secondary | ICD-10-CM | POA: Diagnosis not present

## 2023-04-27 DIAGNOSIS — J9611 Chronic respiratory failure with hypoxia: Secondary | ICD-10-CM | POA: Diagnosis not present

## 2023-04-29 DIAGNOSIS — J9611 Chronic respiratory failure with hypoxia: Secondary | ICD-10-CM | POA: Diagnosis not present

## 2023-04-29 DIAGNOSIS — I878 Other specified disorders of veins: Secondary | ICD-10-CM | POA: Diagnosis not present

## 2023-04-29 DIAGNOSIS — J4489 Other specified chronic obstructive pulmonary disease: Secondary | ICD-10-CM | POA: Diagnosis not present

## 2023-04-29 DIAGNOSIS — N1832 Chronic kidney disease, stage 3b: Secondary | ICD-10-CM | POA: Diagnosis not present

## 2023-04-29 DIAGNOSIS — I480 Paroxysmal atrial fibrillation: Secondary | ICD-10-CM | POA: Diagnosis not present

## 2023-04-29 DIAGNOSIS — I130 Hypertensive heart and chronic kidney disease with heart failure and stage 1 through stage 4 chronic kidney disease, or unspecified chronic kidney disease: Secondary | ICD-10-CM | POA: Diagnosis not present

## 2023-04-29 DIAGNOSIS — I5032 Chronic diastolic (congestive) heart failure: Secondary | ICD-10-CM | POA: Diagnosis not present

## 2023-04-29 DIAGNOSIS — W19XXXD Unspecified fall, subsequent encounter: Secondary | ICD-10-CM | POA: Diagnosis not present

## 2023-04-29 DIAGNOSIS — K219 Gastro-esophageal reflux disease without esophagitis: Secondary | ICD-10-CM | POA: Diagnosis not present

## 2023-04-29 DIAGNOSIS — E1122 Type 2 diabetes mellitus with diabetic chronic kidney disease: Secondary | ICD-10-CM | POA: Diagnosis not present

## 2023-04-29 DIAGNOSIS — Z87440 Personal history of urinary (tract) infections: Secondary | ICD-10-CM | POA: Diagnosis not present

## 2023-04-29 DIAGNOSIS — I495 Sick sinus syndrome: Secondary | ICD-10-CM | POA: Diagnosis not present

## 2023-04-29 DIAGNOSIS — E1165 Type 2 diabetes mellitus with hyperglycemia: Secondary | ICD-10-CM | POA: Diagnosis not present

## 2023-04-29 DIAGNOSIS — E785 Hyperlipidemia, unspecified: Secondary | ICD-10-CM | POA: Diagnosis not present

## 2023-04-29 DIAGNOSIS — R627 Adult failure to thrive: Secondary | ICD-10-CM | POA: Diagnosis not present

## 2023-04-29 DIAGNOSIS — I4892 Unspecified atrial flutter: Secondary | ICD-10-CM | POA: Diagnosis not present

## 2023-04-29 DIAGNOSIS — Z9981 Dependence on supplemental oxygen: Secondary | ICD-10-CM | POA: Diagnosis not present

## 2023-04-29 DIAGNOSIS — R234 Changes in skin texture: Secondary | ICD-10-CM | POA: Diagnosis not present

## 2023-04-29 DIAGNOSIS — I429 Cardiomyopathy, unspecified: Secondary | ICD-10-CM | POA: Diagnosis not present

## 2023-04-29 DIAGNOSIS — E039 Hypothyroidism, unspecified: Secondary | ICD-10-CM | POA: Diagnosis not present

## 2023-04-30 DIAGNOSIS — E039 Hypothyroidism, unspecified: Secondary | ICD-10-CM | POA: Diagnosis not present

## 2023-04-30 DIAGNOSIS — R234 Changes in skin texture: Secondary | ICD-10-CM | POA: Diagnosis not present

## 2023-04-30 DIAGNOSIS — K219 Gastro-esophageal reflux disease without esophagitis: Secondary | ICD-10-CM | POA: Diagnosis not present

## 2023-04-30 DIAGNOSIS — E785 Hyperlipidemia, unspecified: Secondary | ICD-10-CM | POA: Diagnosis not present

## 2023-04-30 DIAGNOSIS — I5032 Chronic diastolic (congestive) heart failure: Secondary | ICD-10-CM | POA: Diagnosis not present

## 2023-04-30 DIAGNOSIS — I480 Paroxysmal atrial fibrillation: Secondary | ICD-10-CM | POA: Diagnosis not present

## 2023-04-30 DIAGNOSIS — R627 Adult failure to thrive: Secondary | ICD-10-CM | POA: Diagnosis not present

## 2023-04-30 DIAGNOSIS — I429 Cardiomyopathy, unspecified: Secondary | ICD-10-CM | POA: Diagnosis not present

## 2023-04-30 DIAGNOSIS — J9611 Chronic respiratory failure with hypoxia: Secondary | ICD-10-CM | POA: Diagnosis not present

## 2023-04-30 DIAGNOSIS — E1122 Type 2 diabetes mellitus with diabetic chronic kidney disease: Secondary | ICD-10-CM | POA: Diagnosis not present

## 2023-04-30 DIAGNOSIS — I130 Hypertensive heart and chronic kidney disease with heart failure and stage 1 through stage 4 chronic kidney disease, or unspecified chronic kidney disease: Secondary | ICD-10-CM | POA: Diagnosis not present

## 2023-04-30 DIAGNOSIS — I4892 Unspecified atrial flutter: Secondary | ICD-10-CM | POA: Diagnosis not present

## 2023-04-30 DIAGNOSIS — I878 Other specified disorders of veins: Secondary | ICD-10-CM | POA: Diagnosis not present

## 2023-04-30 DIAGNOSIS — W19XXXD Unspecified fall, subsequent encounter: Secondary | ICD-10-CM | POA: Diagnosis not present

## 2023-04-30 DIAGNOSIS — I495 Sick sinus syndrome: Secondary | ICD-10-CM | POA: Diagnosis not present

## 2023-04-30 DIAGNOSIS — E1165 Type 2 diabetes mellitus with hyperglycemia: Secondary | ICD-10-CM | POA: Diagnosis not present

## 2023-04-30 DIAGNOSIS — Z87440 Personal history of urinary (tract) infections: Secondary | ICD-10-CM | POA: Diagnosis not present

## 2023-04-30 DIAGNOSIS — N1832 Chronic kidney disease, stage 3b: Secondary | ICD-10-CM | POA: Diagnosis not present

## 2023-04-30 DIAGNOSIS — Z9981 Dependence on supplemental oxygen: Secondary | ICD-10-CM | POA: Diagnosis not present

## 2023-04-30 DIAGNOSIS — J4489 Other specified chronic obstructive pulmonary disease: Secondary | ICD-10-CM | POA: Diagnosis not present

## 2023-05-02 DIAGNOSIS — J9611 Chronic respiratory failure with hypoxia: Secondary | ICD-10-CM | POA: Diagnosis not present

## 2023-05-02 DIAGNOSIS — R627 Adult failure to thrive: Secondary | ICD-10-CM | POA: Diagnosis not present

## 2023-05-02 DIAGNOSIS — E1122 Type 2 diabetes mellitus with diabetic chronic kidney disease: Secondary | ICD-10-CM | POA: Diagnosis not present

## 2023-05-02 DIAGNOSIS — R234 Changes in skin texture: Secondary | ICD-10-CM | POA: Diagnosis not present

## 2023-05-02 DIAGNOSIS — Z87440 Personal history of urinary (tract) infections: Secondary | ICD-10-CM | POA: Diagnosis not present

## 2023-05-02 DIAGNOSIS — I4892 Unspecified atrial flutter: Secondary | ICD-10-CM | POA: Diagnosis not present

## 2023-05-02 DIAGNOSIS — J4489 Other specified chronic obstructive pulmonary disease: Secondary | ICD-10-CM | POA: Diagnosis not present

## 2023-05-02 DIAGNOSIS — I5032 Chronic diastolic (congestive) heart failure: Secondary | ICD-10-CM | POA: Diagnosis not present

## 2023-05-02 DIAGNOSIS — N1832 Chronic kidney disease, stage 3b: Secondary | ICD-10-CM | POA: Diagnosis not present

## 2023-05-02 DIAGNOSIS — I480 Paroxysmal atrial fibrillation: Secondary | ICD-10-CM | POA: Diagnosis not present

## 2023-05-02 DIAGNOSIS — I878 Other specified disorders of veins: Secondary | ICD-10-CM | POA: Diagnosis not present

## 2023-05-02 DIAGNOSIS — E039 Hypothyroidism, unspecified: Secondary | ICD-10-CM | POA: Diagnosis not present

## 2023-05-02 DIAGNOSIS — Z9981 Dependence on supplemental oxygen: Secondary | ICD-10-CM | POA: Diagnosis not present

## 2023-05-02 DIAGNOSIS — W19XXXD Unspecified fall, subsequent encounter: Secondary | ICD-10-CM | POA: Diagnosis not present

## 2023-05-02 DIAGNOSIS — K219 Gastro-esophageal reflux disease without esophagitis: Secondary | ICD-10-CM | POA: Diagnosis not present

## 2023-05-02 DIAGNOSIS — E1165 Type 2 diabetes mellitus with hyperglycemia: Secondary | ICD-10-CM | POA: Diagnosis not present

## 2023-05-02 DIAGNOSIS — I130 Hypertensive heart and chronic kidney disease with heart failure and stage 1 through stage 4 chronic kidney disease, or unspecified chronic kidney disease: Secondary | ICD-10-CM | POA: Diagnosis not present

## 2023-05-02 DIAGNOSIS — I429 Cardiomyopathy, unspecified: Secondary | ICD-10-CM | POA: Diagnosis not present

## 2023-05-02 DIAGNOSIS — E785 Hyperlipidemia, unspecified: Secondary | ICD-10-CM | POA: Diagnosis not present

## 2023-05-02 DIAGNOSIS — I495 Sick sinus syndrome: Secondary | ICD-10-CM | POA: Diagnosis not present

## 2023-05-03 DIAGNOSIS — M6281 Muscle weakness (generalized): Secondary | ICD-10-CM | POA: Diagnosis not present

## 2023-05-03 DIAGNOSIS — E1122 Type 2 diabetes mellitus with diabetic chronic kidney disease: Secondary | ICD-10-CM | POA: Diagnosis not present

## 2023-05-03 DIAGNOSIS — N1831 Chronic kidney disease, stage 3a: Secondary | ICD-10-CM | POA: Diagnosis not present

## 2023-05-03 DIAGNOSIS — I270 Primary pulmonary hypertension: Secondary | ICD-10-CM | POA: Diagnosis not present

## 2023-05-03 DIAGNOSIS — J810 Acute pulmonary edema: Secondary | ICD-10-CM | POA: Diagnosis not present

## 2023-05-06 DIAGNOSIS — R627 Adult failure to thrive: Secondary | ICD-10-CM | POA: Diagnosis not present

## 2023-05-06 DIAGNOSIS — I429 Cardiomyopathy, unspecified: Secondary | ICD-10-CM | POA: Diagnosis not present

## 2023-05-06 DIAGNOSIS — I130 Hypertensive heart and chronic kidney disease with heart failure and stage 1 through stage 4 chronic kidney disease, or unspecified chronic kidney disease: Secondary | ICD-10-CM | POA: Diagnosis not present

## 2023-05-06 DIAGNOSIS — E785 Hyperlipidemia, unspecified: Secondary | ICD-10-CM | POA: Diagnosis not present

## 2023-05-06 DIAGNOSIS — J4489 Other specified chronic obstructive pulmonary disease: Secondary | ICD-10-CM | POA: Diagnosis not present

## 2023-05-06 DIAGNOSIS — Z9981 Dependence on supplemental oxygen: Secondary | ICD-10-CM | POA: Diagnosis not present

## 2023-05-06 DIAGNOSIS — I480 Paroxysmal atrial fibrillation: Secondary | ICD-10-CM | POA: Diagnosis not present

## 2023-05-06 DIAGNOSIS — E1165 Type 2 diabetes mellitus with hyperglycemia: Secondary | ICD-10-CM | POA: Diagnosis not present

## 2023-05-06 DIAGNOSIS — I4892 Unspecified atrial flutter: Secondary | ICD-10-CM | POA: Diagnosis not present

## 2023-05-06 DIAGNOSIS — Z87440 Personal history of urinary (tract) infections: Secondary | ICD-10-CM | POA: Diagnosis not present

## 2023-05-06 DIAGNOSIS — N1832 Chronic kidney disease, stage 3b: Secondary | ICD-10-CM | POA: Diagnosis not present

## 2023-05-06 DIAGNOSIS — J9611 Chronic respiratory failure with hypoxia: Secondary | ICD-10-CM | POA: Diagnosis not present

## 2023-05-06 DIAGNOSIS — K219 Gastro-esophageal reflux disease without esophagitis: Secondary | ICD-10-CM | POA: Diagnosis not present

## 2023-05-06 DIAGNOSIS — I5032 Chronic diastolic (congestive) heart failure: Secondary | ICD-10-CM | POA: Diagnosis not present

## 2023-05-06 DIAGNOSIS — I878 Other specified disorders of veins: Secondary | ICD-10-CM | POA: Diagnosis not present

## 2023-05-06 DIAGNOSIS — E039 Hypothyroidism, unspecified: Secondary | ICD-10-CM | POA: Diagnosis not present

## 2023-05-06 DIAGNOSIS — W19XXXD Unspecified fall, subsequent encounter: Secondary | ICD-10-CM | POA: Diagnosis not present

## 2023-05-06 DIAGNOSIS — I495 Sick sinus syndrome: Secondary | ICD-10-CM | POA: Diagnosis not present

## 2023-05-06 DIAGNOSIS — E1122 Type 2 diabetes mellitus with diabetic chronic kidney disease: Secondary | ICD-10-CM | POA: Diagnosis not present

## 2023-05-06 DIAGNOSIS — R234 Changes in skin texture: Secondary | ICD-10-CM | POA: Diagnosis not present

## 2023-05-08 DIAGNOSIS — E1122 Type 2 diabetes mellitus with diabetic chronic kidney disease: Secondary | ICD-10-CM | POA: Diagnosis not present

## 2023-05-08 DIAGNOSIS — I130 Hypertensive heart and chronic kidney disease with heart failure and stage 1 through stage 4 chronic kidney disease, or unspecified chronic kidney disease: Secondary | ICD-10-CM | POA: Diagnosis not present

## 2023-05-08 DIAGNOSIS — Z87440 Personal history of urinary (tract) infections: Secondary | ICD-10-CM | POA: Diagnosis not present

## 2023-05-08 DIAGNOSIS — Z9981 Dependence on supplemental oxygen: Secondary | ICD-10-CM | POA: Diagnosis not present

## 2023-05-08 DIAGNOSIS — I429 Cardiomyopathy, unspecified: Secondary | ICD-10-CM | POA: Diagnosis not present

## 2023-05-08 DIAGNOSIS — R627 Adult failure to thrive: Secondary | ICD-10-CM | POA: Diagnosis not present

## 2023-05-08 DIAGNOSIS — E039 Hypothyroidism, unspecified: Secondary | ICD-10-CM | POA: Diagnosis not present

## 2023-05-08 DIAGNOSIS — I4892 Unspecified atrial flutter: Secondary | ICD-10-CM | POA: Diagnosis not present

## 2023-05-08 DIAGNOSIS — K219 Gastro-esophageal reflux disease without esophagitis: Secondary | ICD-10-CM | POA: Diagnosis not present

## 2023-05-08 DIAGNOSIS — R234 Changes in skin texture: Secondary | ICD-10-CM | POA: Diagnosis not present

## 2023-05-08 DIAGNOSIS — J4489 Other specified chronic obstructive pulmonary disease: Secondary | ICD-10-CM | POA: Diagnosis not present

## 2023-05-08 DIAGNOSIS — N1832 Chronic kidney disease, stage 3b: Secondary | ICD-10-CM | POA: Diagnosis not present

## 2023-05-08 DIAGNOSIS — W19XXXD Unspecified fall, subsequent encounter: Secondary | ICD-10-CM | POA: Diagnosis not present

## 2023-05-08 DIAGNOSIS — I5032 Chronic diastolic (congestive) heart failure: Secondary | ICD-10-CM | POA: Diagnosis not present

## 2023-05-08 DIAGNOSIS — E785 Hyperlipidemia, unspecified: Secondary | ICD-10-CM | POA: Diagnosis not present

## 2023-05-08 DIAGNOSIS — I878 Other specified disorders of veins: Secondary | ICD-10-CM | POA: Diagnosis not present

## 2023-05-08 DIAGNOSIS — J9611 Chronic respiratory failure with hypoxia: Secondary | ICD-10-CM | POA: Diagnosis not present

## 2023-05-08 DIAGNOSIS — I480 Paroxysmal atrial fibrillation: Secondary | ICD-10-CM | POA: Diagnosis not present

## 2023-05-08 DIAGNOSIS — I495 Sick sinus syndrome: Secondary | ICD-10-CM | POA: Diagnosis not present

## 2023-05-08 DIAGNOSIS — E1165 Type 2 diabetes mellitus with hyperglycemia: Secondary | ICD-10-CM | POA: Diagnosis not present

## 2023-05-10 DIAGNOSIS — I4891 Unspecified atrial fibrillation: Secondary | ICD-10-CM | POA: Diagnosis not present

## 2023-05-10 DIAGNOSIS — Z9181 History of falling: Secondary | ICD-10-CM | POA: Diagnosis not present

## 2023-05-10 DIAGNOSIS — E039 Hypothyroidism, unspecified: Secondary | ICD-10-CM | POA: Diagnosis not present

## 2023-05-10 DIAGNOSIS — E1121 Type 2 diabetes mellitus with diabetic nephropathy: Secondary | ICD-10-CM | POA: Diagnosis not present

## 2023-05-10 DIAGNOSIS — E559 Vitamin D deficiency, unspecified: Secondary | ICD-10-CM | POA: Diagnosis not present

## 2023-05-14 DIAGNOSIS — E1165 Type 2 diabetes mellitus with hyperglycemia: Secondary | ICD-10-CM | POA: Diagnosis not present

## 2023-05-14 DIAGNOSIS — I429 Cardiomyopathy, unspecified: Secondary | ICD-10-CM | POA: Diagnosis not present

## 2023-05-14 DIAGNOSIS — J4489 Other specified chronic obstructive pulmonary disease: Secondary | ICD-10-CM | POA: Diagnosis not present

## 2023-05-14 DIAGNOSIS — I878 Other specified disorders of veins: Secondary | ICD-10-CM | POA: Diagnosis not present

## 2023-05-14 DIAGNOSIS — I4892 Unspecified atrial flutter: Secondary | ICD-10-CM | POA: Diagnosis not present

## 2023-05-14 DIAGNOSIS — E1122 Type 2 diabetes mellitus with diabetic chronic kidney disease: Secondary | ICD-10-CM | POA: Diagnosis not present

## 2023-05-14 DIAGNOSIS — R627 Adult failure to thrive: Secondary | ICD-10-CM | POA: Diagnosis not present

## 2023-05-14 DIAGNOSIS — J9611 Chronic respiratory failure with hypoxia: Secondary | ICD-10-CM | POA: Diagnosis not present

## 2023-05-14 DIAGNOSIS — Z9981 Dependence on supplemental oxygen: Secondary | ICD-10-CM | POA: Diagnosis not present

## 2023-05-14 DIAGNOSIS — E785 Hyperlipidemia, unspecified: Secondary | ICD-10-CM | POA: Diagnosis not present

## 2023-05-14 DIAGNOSIS — W19XXXD Unspecified fall, subsequent encounter: Secondary | ICD-10-CM | POA: Diagnosis not present

## 2023-05-14 DIAGNOSIS — E039 Hypothyroidism, unspecified: Secondary | ICD-10-CM | POA: Diagnosis not present

## 2023-05-14 DIAGNOSIS — I5032 Chronic diastolic (congestive) heart failure: Secondary | ICD-10-CM | POA: Diagnosis not present

## 2023-05-14 DIAGNOSIS — K219 Gastro-esophageal reflux disease without esophagitis: Secondary | ICD-10-CM | POA: Diagnosis not present

## 2023-05-14 DIAGNOSIS — Z87440 Personal history of urinary (tract) infections: Secondary | ICD-10-CM | POA: Diagnosis not present

## 2023-05-14 DIAGNOSIS — I495 Sick sinus syndrome: Secondary | ICD-10-CM | POA: Diagnosis not present

## 2023-05-14 DIAGNOSIS — N1832 Chronic kidney disease, stage 3b: Secondary | ICD-10-CM | POA: Diagnosis not present

## 2023-05-14 DIAGNOSIS — I480 Paroxysmal atrial fibrillation: Secondary | ICD-10-CM | POA: Diagnosis not present

## 2023-05-14 DIAGNOSIS — I130 Hypertensive heart and chronic kidney disease with heart failure and stage 1 through stage 4 chronic kidney disease, or unspecified chronic kidney disease: Secondary | ICD-10-CM | POA: Diagnosis not present

## 2023-05-14 DIAGNOSIS — R234 Changes in skin texture: Secondary | ICD-10-CM | POA: Diagnosis not present

## 2023-05-31 ENCOUNTER — Ambulatory Visit (HOSPITAL_COMMUNITY): Payer: Medicare Other

## 2023-05-31 ENCOUNTER — Encounter: Payer: Medicare Other | Admitting: Vascular Surgery

## 2023-06-02 DIAGNOSIS — J810 Acute pulmonary edema: Secondary | ICD-10-CM | POA: Diagnosis not present

## 2023-06-02 DIAGNOSIS — N1831 Chronic kidney disease, stage 3a: Secondary | ICD-10-CM | POA: Diagnosis not present

## 2023-06-02 DIAGNOSIS — E1122 Type 2 diabetes mellitus with diabetic chronic kidney disease: Secondary | ICD-10-CM | POA: Diagnosis not present

## 2023-06-02 DIAGNOSIS — M6281 Muscle weakness (generalized): Secondary | ICD-10-CM | POA: Diagnosis not present

## 2023-06-06 DIAGNOSIS — 419620001 Death: Secondary | SNOMED CT | POA: Diagnosis not present

## 2023-06-06 DEATH — deceased

## 2023-06-10 ENCOUNTER — Ambulatory Visit: Payer: Medicare Other

## 2023-09-09 ENCOUNTER — Ambulatory Visit: Payer: Medicare Other

## 2023-12-09 ENCOUNTER — Ambulatory Visit: Payer: Medicare Other

## 2024-03-09 ENCOUNTER — Ambulatory Visit: Payer: Medicare Other

## 2024-06-08 ENCOUNTER — Ambulatory Visit: Payer: Medicare Other

## 2024-09-07 ENCOUNTER — Ambulatory Visit: Payer: Medicare Other

## 2024-11-11 ENCOUNTER — Telehealth: Payer: Self-pay | Admitting: *Deleted

## 2024-11-11 NOTE — Telephone Encounter (Signed)
 Called and spoke with the patient's sister Johnita) to discuss the patient missing her last 5 remote pacer checks and the patient's number being unreachable/not in service  Doretha informed this RN that the patient had passed about 6 months ago  This RN offered his condolences and then asked Doretha if she knew the exact date of the patient's passing  Doretha was uncertain and told this RN that the patient's daughter Mercy Burnet [phone]) would have all that information and that she would call her and then call the clinic back with that information  Device clinic number provided to [facility] by this RN  This RN will go ahead and remove patient from [employer] monitoring site and mark as Expired in [employer]  Exact date of death will be updated once information received

## 2024-12-07 ENCOUNTER — Ambulatory Visit: Payer: Medicare Other

## 2024-12-09 ENCOUNTER — Telehealth: Payer: Self-pay | Admitting: Cardiovascular Disease

## 2024-12-09 NOTE — Telephone Encounter (Signed)
 Called re missed remote

## 2025-03-08 ENCOUNTER — Ambulatory Visit: Payer: Self-pay

## 2025-06-07 ENCOUNTER — Ambulatory Visit: Payer: Self-pay

## 2025-09-06 ENCOUNTER — Ambulatory Visit: Payer: Self-pay

## 2025-12-06 ENCOUNTER — Ambulatory Visit: Payer: Self-pay
